# Patient Record
Sex: Female | Born: 1940 | Race: White | Hispanic: No | Smoking: Never smoker
Health system: Southern US, Community
[De-identification: ages and names within clinical notes are randomized; demographics above are authoritative.]

## PROBLEM LIST (undated history)

## (undated) DIAGNOSIS — M25569 Pain in unspecified knee: Secondary | ICD-10-CM

## (undated) DIAGNOSIS — Z923 Personal history of irradiation: Secondary | ICD-10-CM

## (undated) DIAGNOSIS — Z8719 Personal history of other diseases of the digestive system: Secondary | ICD-10-CM

## (undated) DIAGNOSIS — K219 Gastro-esophageal reflux disease without esophagitis: Secondary | ICD-10-CM

## (undated) DIAGNOSIS — I10 Essential (primary) hypertension: Secondary | ICD-10-CM

## (undated) DIAGNOSIS — N2889 Other specified disorders of kidney and ureter: Secondary | ICD-10-CM

## (undated) DIAGNOSIS — I671 Cerebral aneurysm, nonruptured: Secondary | ICD-10-CM

## (undated) DIAGNOSIS — I639 Cerebral infarction, unspecified: Secondary | ICD-10-CM

## (undated) DIAGNOSIS — R32 Unspecified urinary incontinence: Secondary | ICD-10-CM

## (undated) DIAGNOSIS — C801 Malignant (primary) neoplasm, unspecified: Secondary | ICD-10-CM

## (undated) DIAGNOSIS — I7789 Other specified disorders of arteries and arterioles: Secondary | ICD-10-CM

## (undated) DIAGNOSIS — R3915 Urgency of urination: Secondary | ICD-10-CM

## (undated) DIAGNOSIS — I499 Cardiac arrhythmia, unspecified: Secondary | ICD-10-CM

## (undated) HISTORY — PX: PILONIDAL CYST EXCISION: SHX744

## (undated) HISTORY — PX: LYSIS OF ADHESION: SHX5961

## (undated) HISTORY — PX: LAMINECTOMY: SHX219

## (undated) HISTORY — PX: LEFT ATRIAL APPENDAGE OCCLUSION: SHX173A

## (undated) HISTORY — PX: CHOLECYSTECTOMY: SHX55

## (undated) HISTORY — PX: BACK SURGERY: SHX140

## (undated) HISTORY — PX: ABDOMINAL HYSTERECTOMY: SHX81

## (undated) HISTORY — PX: BREAST LUMPECTOMY: SHX2

---

## 1992-08-08 DIAGNOSIS — C801 Malignant (primary) neoplasm, unspecified: Secondary | ICD-10-CM | POA: Insufficient documentation

## 1992-08-08 HISTORY — DX: Malignant (primary) neoplasm, unspecified: C80.1

## 1994-04-05 HISTORY — PX: BREAST EXCISIONAL BIOPSY: SUR124

## 1999-03-22 ENCOUNTER — Ambulatory Visit (HOSPITAL_COMMUNITY): Admission: RE | Admit: 1999-03-22 | Discharge: 1999-03-22 | Payer: Self-pay | Admitting: Gastroenterology

## 1999-10-19 ENCOUNTER — Encounter: Admission: RE | Admit: 1999-10-19 | Discharge: 1999-10-19 | Payer: Self-pay | Admitting: Internal Medicine

## 1999-10-19 ENCOUNTER — Encounter: Payer: Self-pay | Admitting: Internal Medicine

## 2001-01-02 ENCOUNTER — Other Ambulatory Visit: Admission: RE | Admit: 2001-01-02 | Discharge: 2001-01-02 | Payer: Self-pay | Admitting: Internal Medicine

## 2001-01-10 ENCOUNTER — Encounter: Admission: RE | Admit: 2001-01-10 | Discharge: 2001-01-10 | Payer: Self-pay | Admitting: Internal Medicine

## 2001-01-10 ENCOUNTER — Encounter: Payer: Self-pay | Admitting: Internal Medicine

## 2001-06-13 ENCOUNTER — Encounter: Payer: Self-pay | Admitting: Internal Medicine

## 2001-06-13 ENCOUNTER — Encounter: Admission: RE | Admit: 2001-06-13 | Discharge: 2001-06-13 | Payer: Self-pay | Admitting: Internal Medicine

## 2001-11-21 ENCOUNTER — Inpatient Hospital Stay (HOSPITAL_COMMUNITY): Admission: AD | Admit: 2001-11-21 | Discharge: 2001-11-22 | Payer: Self-pay | Admitting: *Deleted

## 2001-11-21 ENCOUNTER — Encounter: Payer: Self-pay | Admitting: *Deleted

## 2001-12-13 ENCOUNTER — Ambulatory Visit (HOSPITAL_COMMUNITY): Admission: RE | Admit: 2001-12-13 | Discharge: 2001-12-13 | Payer: Self-pay | Admitting: Gastroenterology

## 2001-12-19 ENCOUNTER — Encounter (INDEPENDENT_AMBULATORY_CARE_PROVIDER_SITE_OTHER): Payer: Self-pay | Admitting: Specialist

## 2001-12-19 ENCOUNTER — Ambulatory Visit (HOSPITAL_COMMUNITY): Admission: RE | Admit: 2001-12-19 | Discharge: 2001-12-19 | Payer: Self-pay | Admitting: Orthopedic Surgery

## 2002-03-07 ENCOUNTER — Encounter: Admission: RE | Admit: 2002-03-07 | Discharge: 2002-03-07 | Payer: Self-pay | Admitting: Urology

## 2002-03-07 ENCOUNTER — Encounter: Payer: Self-pay | Admitting: Urology

## 2002-03-07 IMAGING — CT CT ABDOMEN W/O CM
1 series · 15 of 32 positions shown, 19 images · IV contrast (agent unspecified)
Comparison: none

FINDINGS
CLINICAL DATA: LEFT ABDOMINAL PAIN WITH GROSS HEMATURIA.
TECHNIQUE
THE STUDY WAS DONE WITH KIDNEY STONE PROTOCOL CONSISTING OF MULTIDETECTOR HELICAL IMAGING THROUGH
THE URINARY TRACT WITHOUT ORAL OR IV CONTRAST.
TODAY'S EXAM IS COMPARED TO A PRIOR STUDY FROM 6/98, ALTHOUGH THAT STUDY WAS DONE WITH CONTRAST
ORAL AND IV.
CT ABDOMEN W/O CONTRAST
STATUS POST CHOLECYSTECTOMY WITH SURGICAL CLIPS IN THE GALLBLADDER FOSSA.  NO DEFINITE RENAL
CALCULI IN THE COLLECTING SYSTEMS.  THERE MAY BE ONE FAINTLY CALCIFIED STONE IN THE LEFT MEDIAL
KIDNEY BUT THIS ACTUALLY LOOKS MORE LIKE A CALCIFICATION IN THE PARENCHYMA WITHIN THE COLLECTING
SYSTEM.  LOOKING BACK AT THE PRIOR STUDY FROM [I8], I BELIEVE IT WAS PRESENT PREVIOUSLY.  NO
HYDRONEPHROSIS OR HYDROURETER.  THERE IS CALCIFICATION IN THE AORTA WITHOUT ANEURYSM.  NO ASCITES,
FOCAL MASSES, OR FLUID COLLECTIONS.  GIVEN THE LIMITATIONS OF SCANNING WITHOUT ORAL OR IV CONTRAST.
IMPRESSION
1.  INTERIM CHOLECYSTECTOMY.
2.  NO EVIDENCE FOR DEFINITE URINARY TRACT CALCULI OR OBSTRUCTION.
3.  THERE IS A SMALL CALCIFICATION IN THE LEFT MEDIAL KIDNEY, PROBABLY IN THE PARENCHYMA RATHER
THAN IN THE COLLECTING SYSTEM.  I BELIEVE THIS WAS PRESENT PREVIOUSLY.
CT PELVIS W/O CONTRAST
I CANNOT SEE ANY DEFINITE URETERAL OBSTRUCTION OR DILATATION.  THERE ARE SOME CALCIFICATIONS IN THE
ILIAC VESSELS THAT SIMULATE STONES.  LOOKING BACK AT THE OLD SCAN THESE CALCIFICATIONS WERE PRESENT
PREVIOUSLY.  I DO NOT SEE ANYTHING THAT IS DEFINITIVELY A URETERAL CALCULUS.  NO BLADDER CALCULI.
NO MASSES OR FLUID COLLECTIONS.
1.  NO DEFINITE URINARY TRACT CALCULI OR OBSTRUCTION.
2.  THERE ARE SOME SMALL CALCIFICATIONS IN THE PELVIS ON BOTH SIDES, THAT I BELIEVE ARE ILIAC
ARTERY BRANCH CALCIFICATIONS, RATHER THAN URETERAL CALCULI.

[Series 2: renal stone · axial · 0.70mm/px · z∈[-370,-85]mm · 15 of 65 slices shown, 19 images]
[im 5/65  soft-tissue]
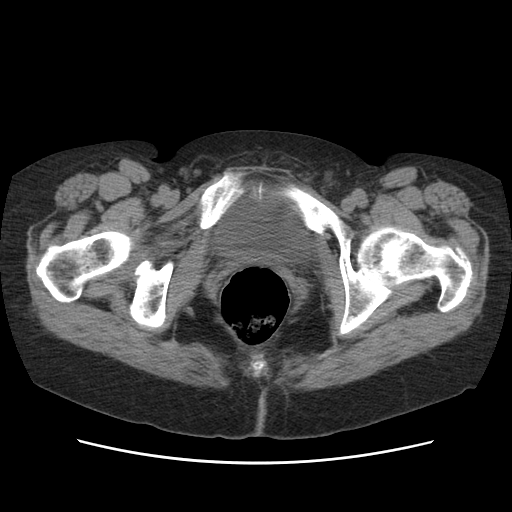
[im 5/65  bone]
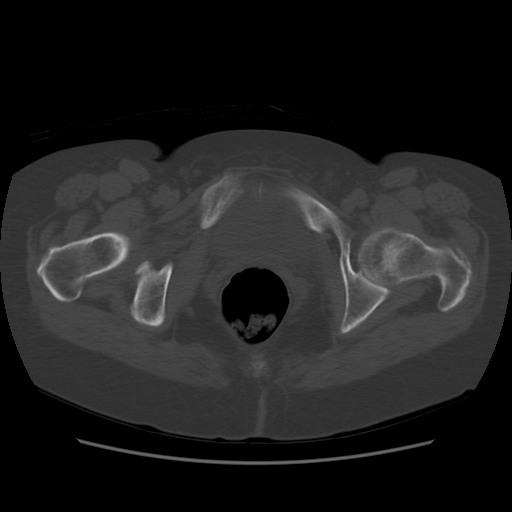
[im 9/65  soft-tissue]
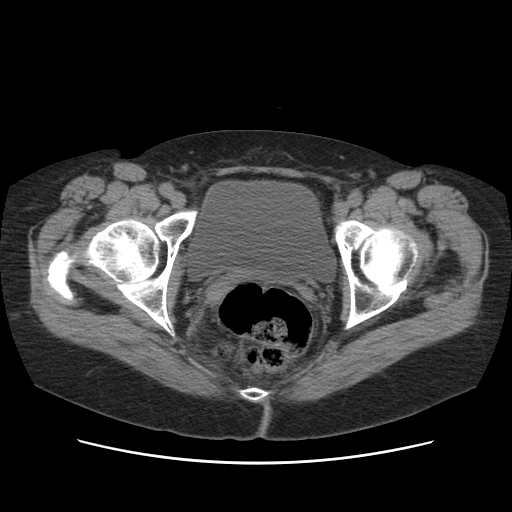
[im 13/65  soft-tissue]
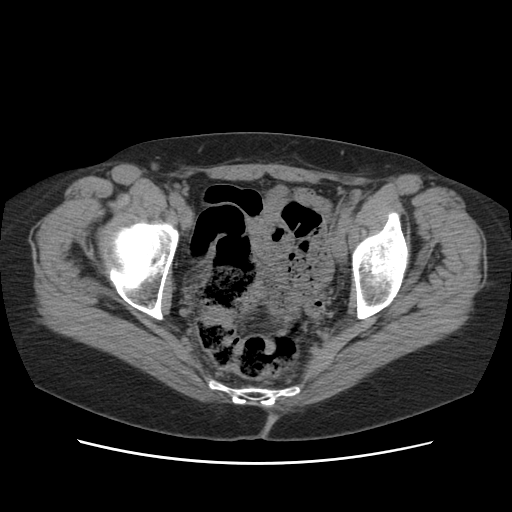
[im 19/65  soft-tissue]
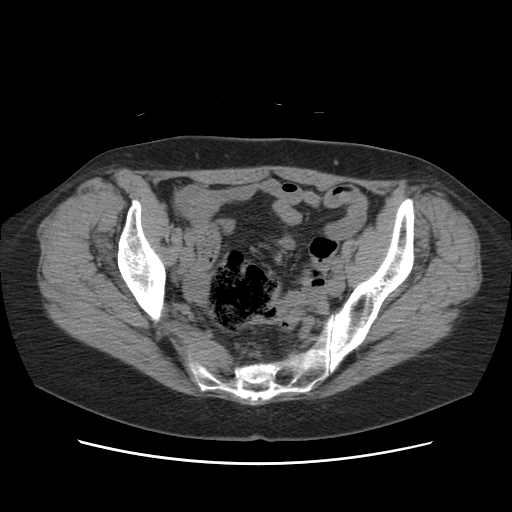
[im 23/65  soft-tissue]
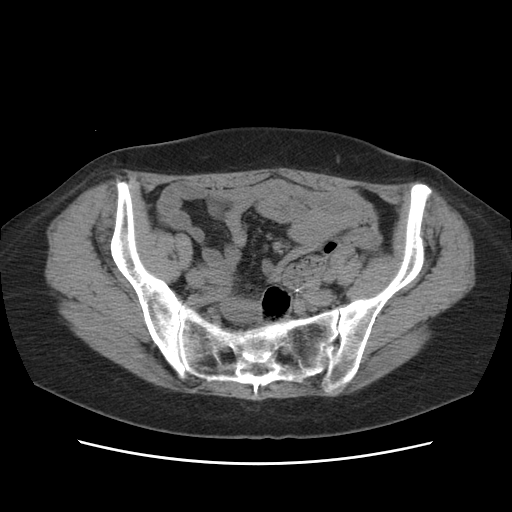
[im 27/65  soft-tissue]
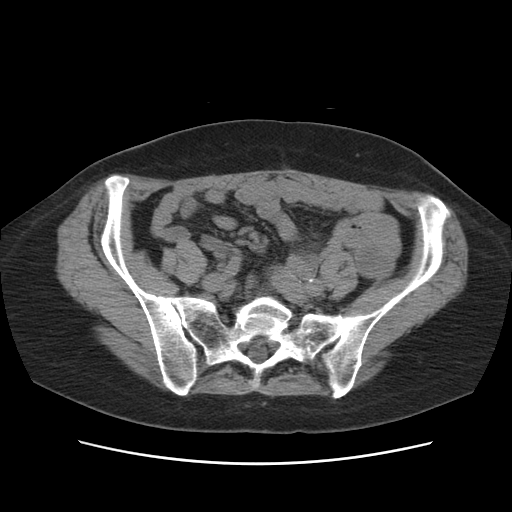
[im 34/65  soft-tissue]
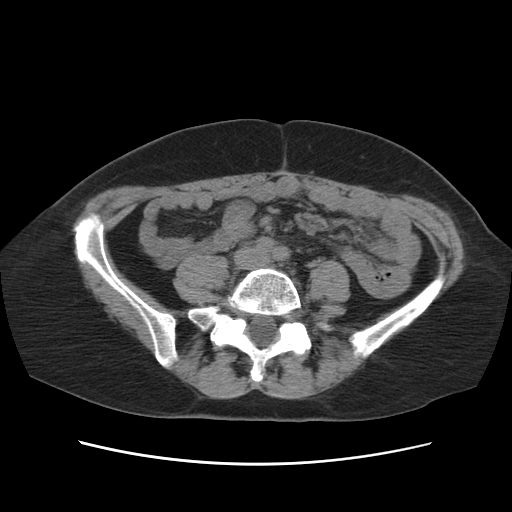
[im 38/65  soft-tissue]
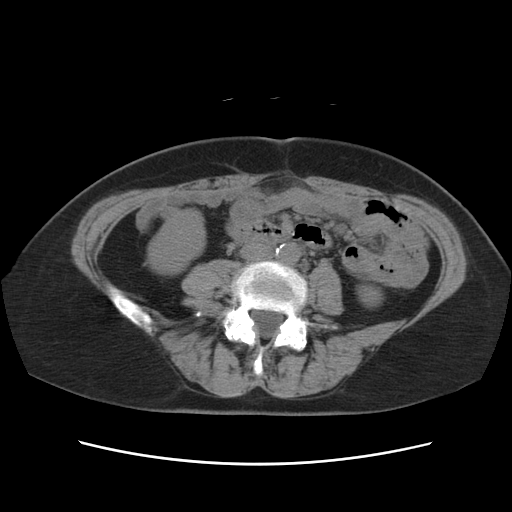
[im 42/65  soft-tissue]
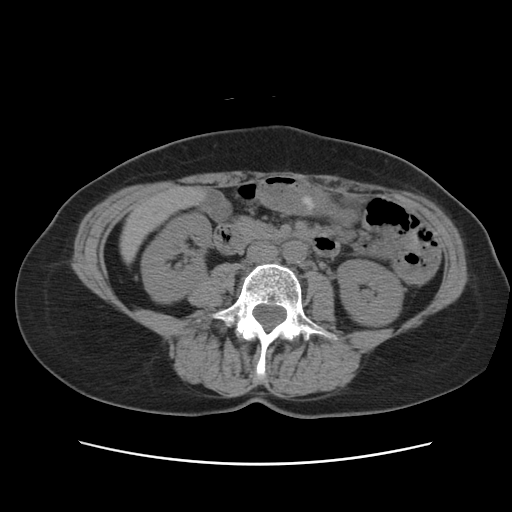
[im 42/65  bone]
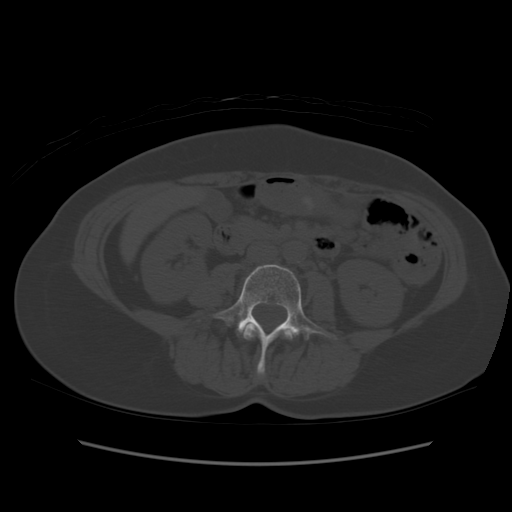
[im 46/65  soft-tissue]
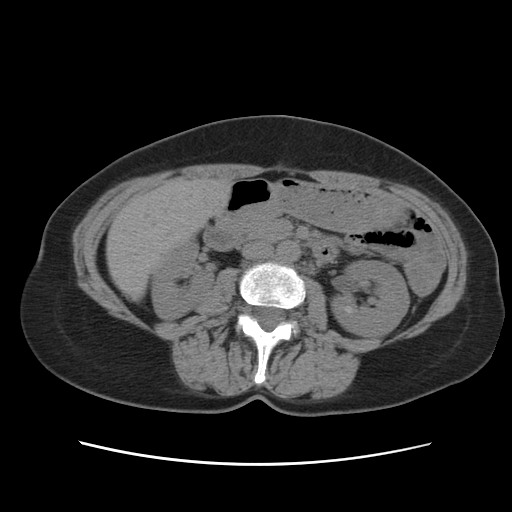
[im 52/65  soft-tissue]
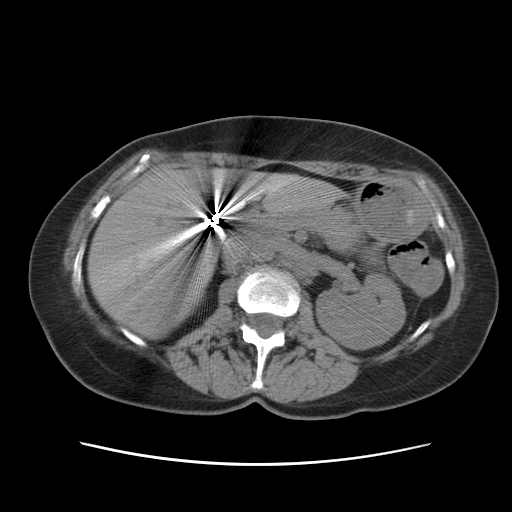
[im 56/65  soft-tissue]
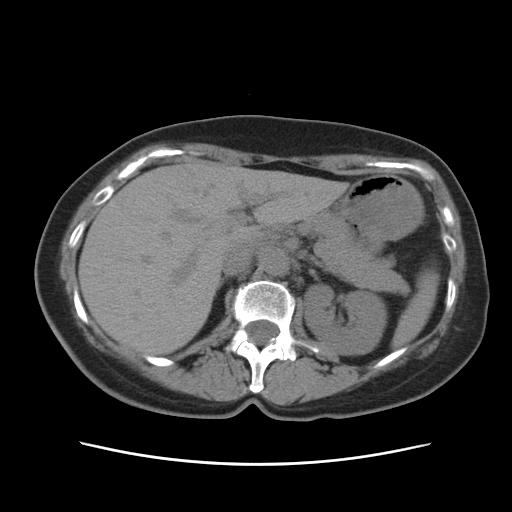
[im 56/65  lung]
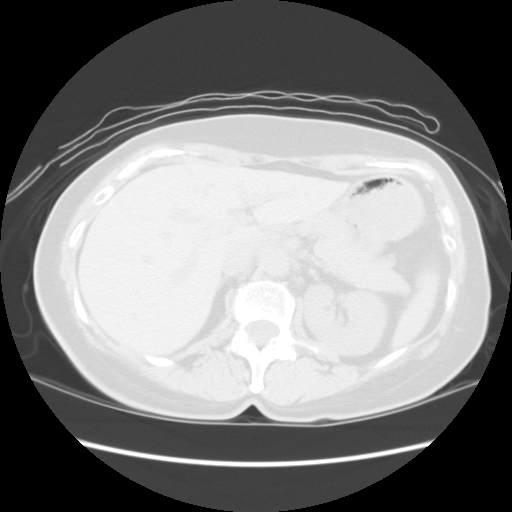
[im 58/65  lung]
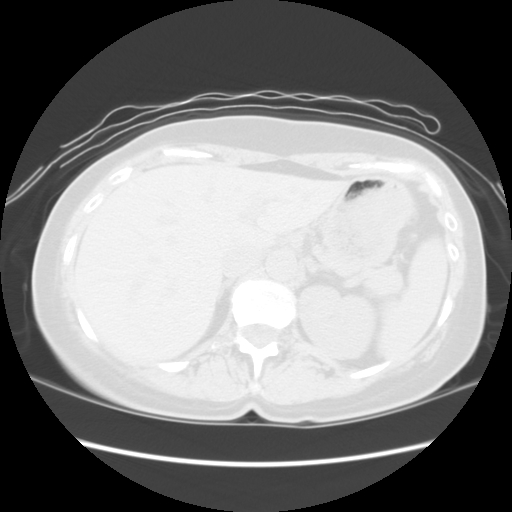
[im 60/65  soft-tissue]
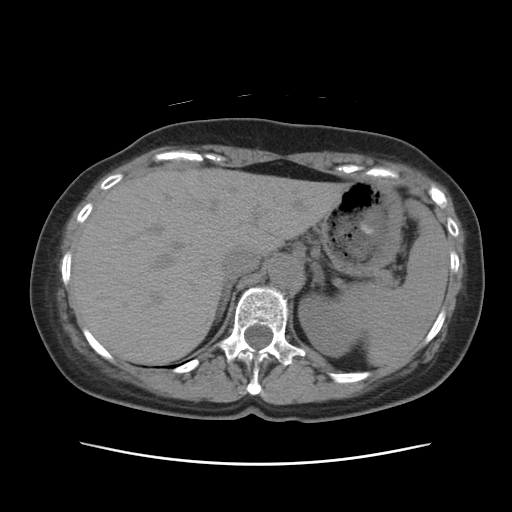
[im 60/65  lung]
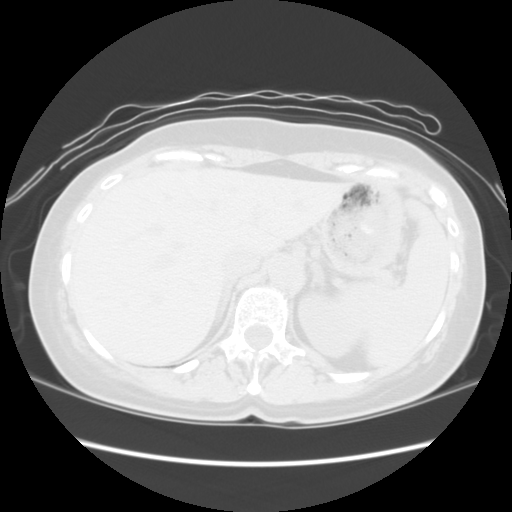
[im 62/65  lung]
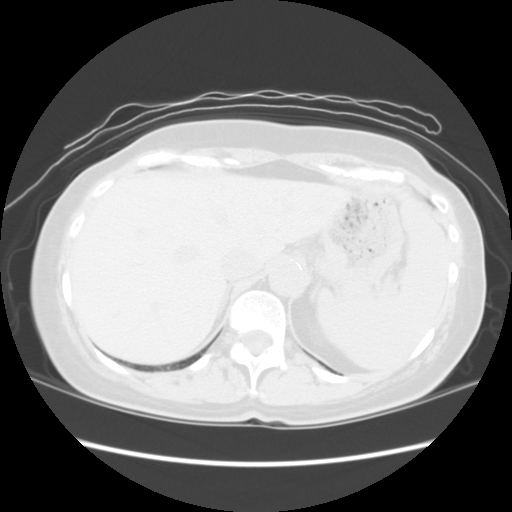

[15 of 32 positions shown; findings below may reference images not displayed]

## 2002-06-12 ENCOUNTER — Encounter (INDEPENDENT_AMBULATORY_CARE_PROVIDER_SITE_OTHER): Payer: Self-pay | Admitting: Specialist

## 2002-06-12 ENCOUNTER — Ambulatory Visit (HOSPITAL_COMMUNITY): Admission: RE | Admit: 2002-06-12 | Discharge: 2002-06-12 | Payer: Self-pay | Admitting: Gastroenterology

## 2002-12-04 ENCOUNTER — Encounter: Admission: RE | Admit: 2002-12-04 | Discharge: 2002-12-04 | Payer: Self-pay | Admitting: Internal Medicine

## 2002-12-04 ENCOUNTER — Encounter: Payer: Self-pay | Admitting: Internal Medicine

## 2003-08-21 ENCOUNTER — Encounter: Admission: RE | Admit: 2003-08-21 | Discharge: 2003-08-21 | Payer: Self-pay | Admitting: Internal Medicine

## 2003-09-04 ENCOUNTER — Encounter: Admission: RE | Admit: 2003-09-04 | Discharge: 2003-09-04 | Payer: Self-pay | Admitting: Family Medicine

## 2003-09-04 IMAGING — CT CT ABDOMEN WO/W CM
1 of 2 series · 14 of 32 positions shown, 18 images · IV contrast (GASTRO)
Comparison: none

CLINICAL DATA: Hematuria, right lower quadrant pain, hypertension.  Post hysterectomy.  Colon cancer removed.  Post cholecystectomy.  (Contrast code: None).
ABDOMINAL CT ? PRE- AND POST- CONTRAST ? [DATE]

[Series 3: — · axial · 0.70mm/px · z∈[-337,+18]mm · 14 of 107 slices shown, 18 images]
[im 5/107  soft-tissue]
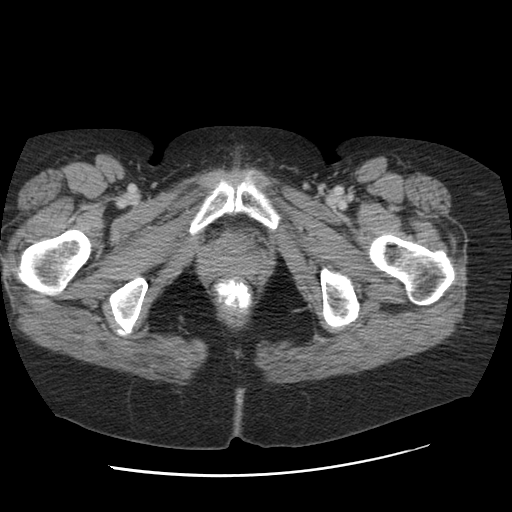
[im 5/107  bone]
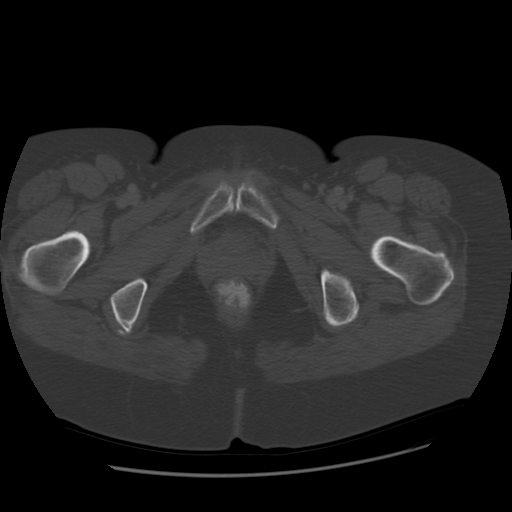
[im 15/107  soft-tissue]
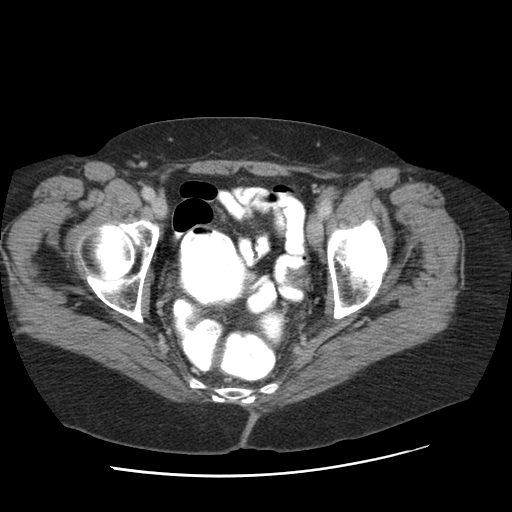
[im 25/107  soft-tissue]
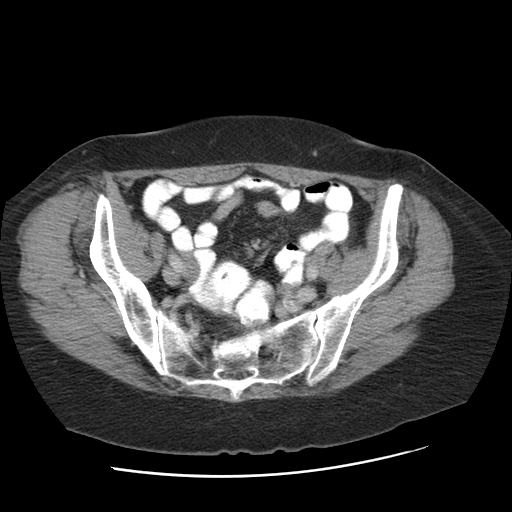
[im 34/107  soft-tissue]
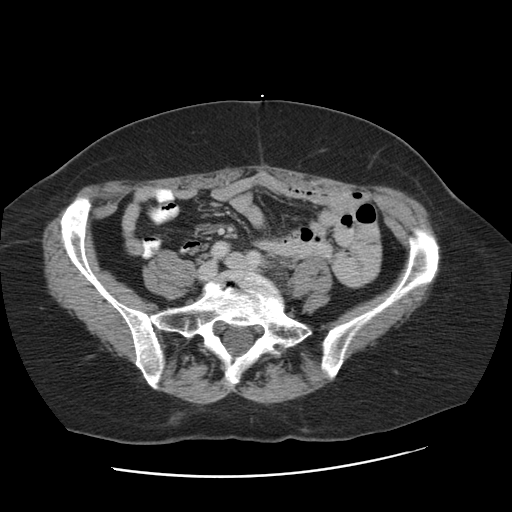
[im 39/107  soft-tissue]
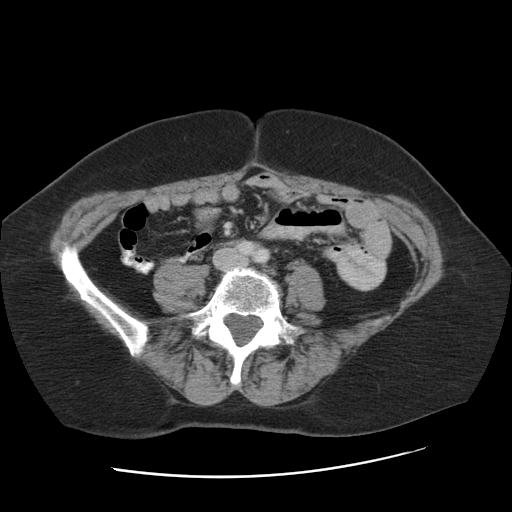
[im 49/107  soft-tissue]
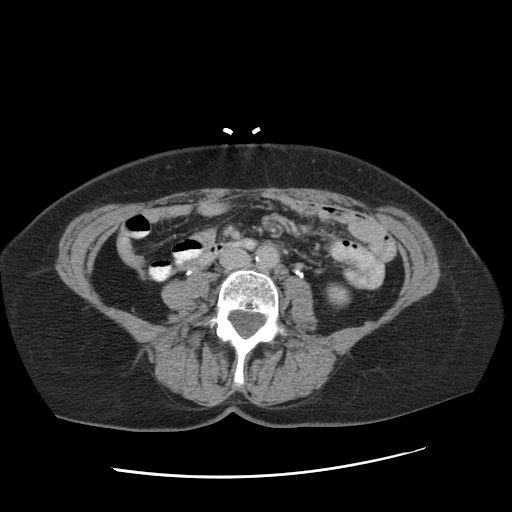
[im 58/107  soft-tissue]
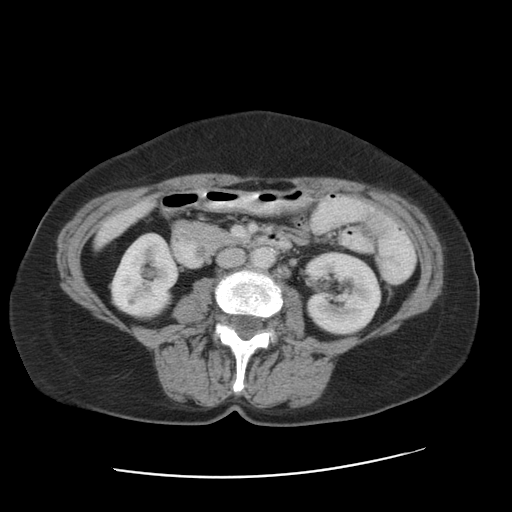
[im 68/107  soft-tissue]
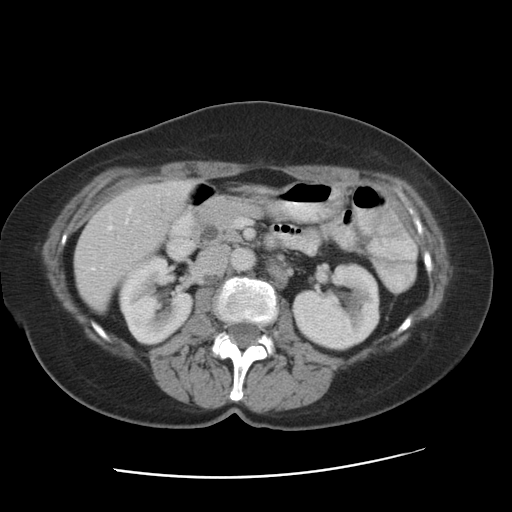
[im 73/107  soft-tissue]
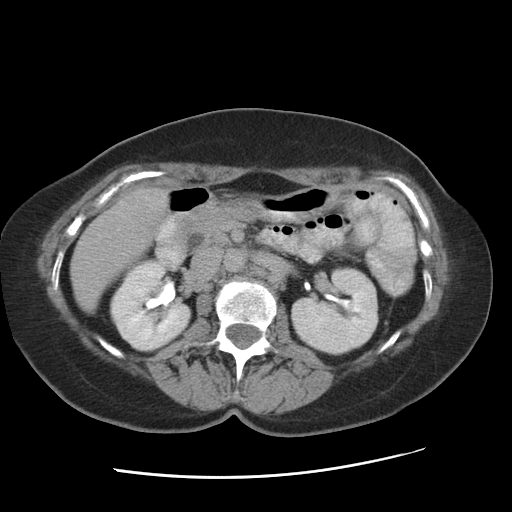
[im 73/107  bone]
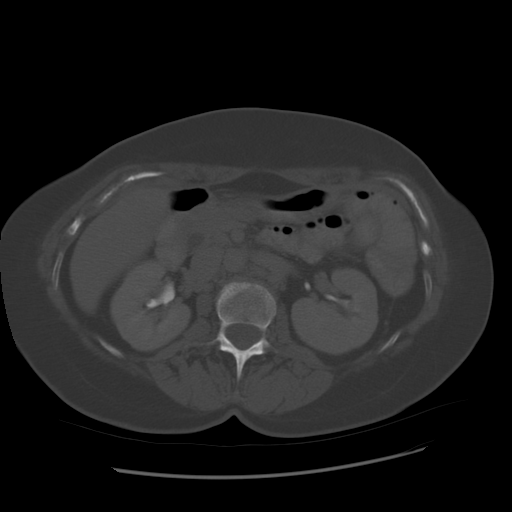
[im 82/107  soft-tissue]
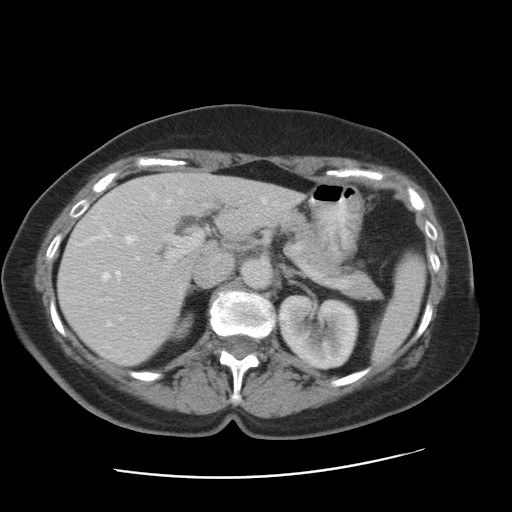
[im 87/107  lung]
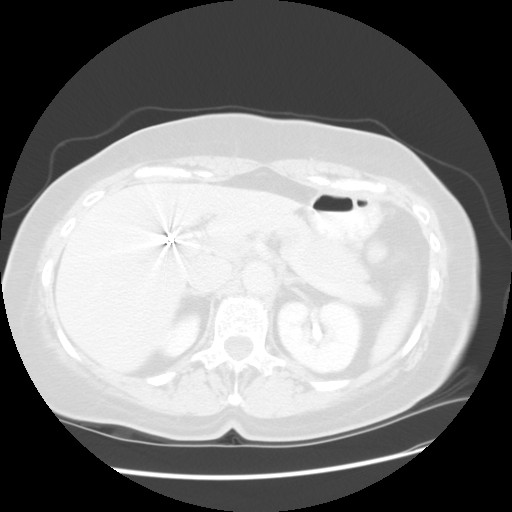
[im 92/107  soft-tissue]
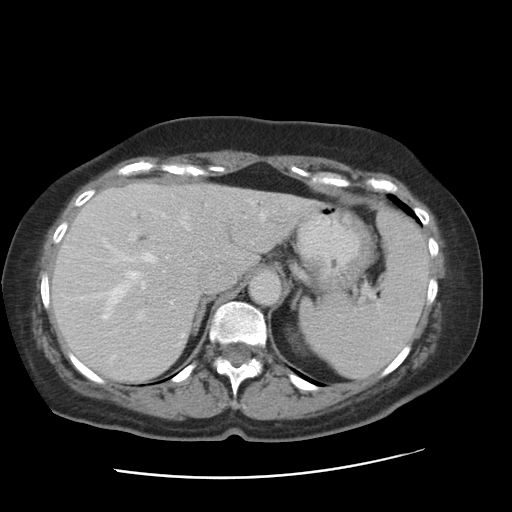
[im 92/107  lung]
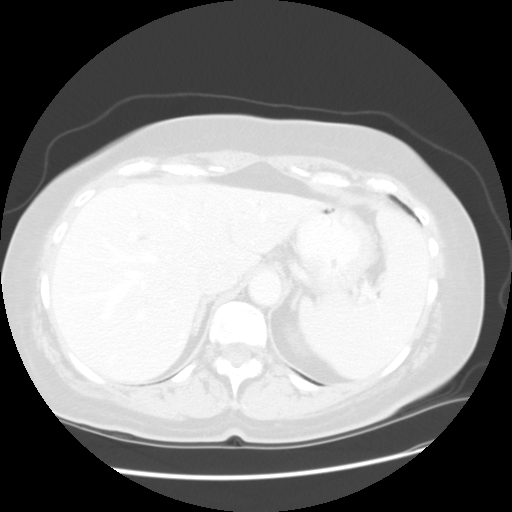
[im 97/107  lung]
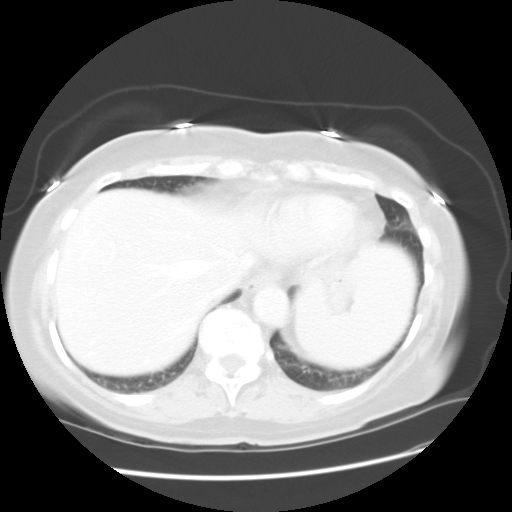
[im 102/107  soft-tissue]
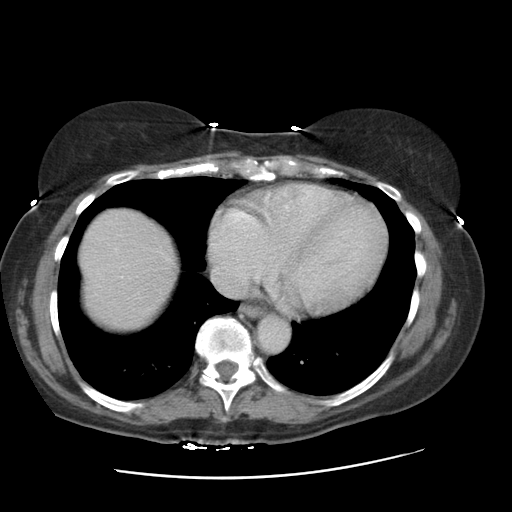
[im 102/107  lung]
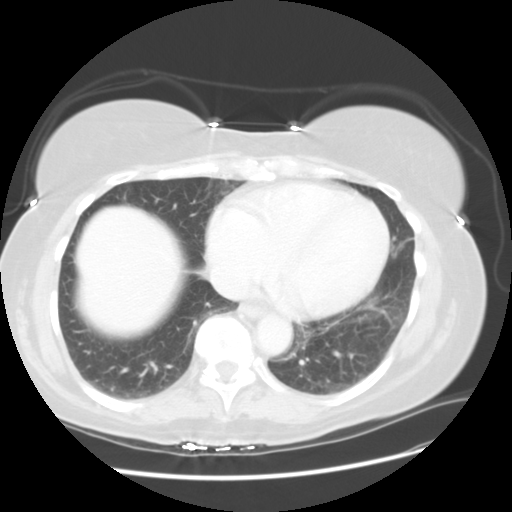

[14 of 32 positions shown; findings below may reference images not displayed]

FINDINGS: Following oral contrast pre- and post- IV administration of 100 cc of Omnipaque 300, multidetector spiral axial images were obtained through the abdomen, and comparison made with prior abdominal and pelvic CT urogram of [DATE] and abdominal ultrasound of [DATE].  
Currently a 6 mm curvilinear density is seen in the region of a small 5 mm round density at the posterior mid left kidney (current image #24 ? previous image #20).  This probably represents stable cortical  calcification.  No other enhancing nor cystic focus is seen on immediate and delayed IV contrast enhanced images at this level.  A duplicated left renal collecting system and ureter probably accounts for larger left renal size as noted on previous abdominal ultrasound of [DATE].  No hydronephrosis nor renal calculi are seen.  The patient remains post cholecystectomy since previous abdominal ultrasound and stable since previous abdominal CT with probable postoperative focal dilatation of the portal hepatis segment of the common bile duct which measures up to 1.5 cm (image #26).  No other proximal nor distal biliary nor pancreatic ductal distention is appreciated.  The patient is near total colectomy with residual rectosigmoid colon.  No evidence for metastatic liver disease nor adenopathy is seen.  Base of the lungs are clear.  On delayed images, probable tiny cortical renal cysts are seen at the anterior mid left (image #94) and 3 mm at the inferior right (image #97).  Remaining abdominal organs appear normal with no new inflammatory change.
IMPRESSION
Since abdominal and pelvic CT urogram [DATE]:
1.  Stable 6 mm faint cortical calcification at the posterior mid left kidney with tiny bilateral cortical renal cystic foci.  Anatomic variant duplication of the left renal collecting system and ureter is noted.
2.  Post cholecystectomy and near total colectomy (see findings on focal dilatation porta hepatis segment CBD).
3.  No evidence for metastatic disease.
PELVIC CT WITH CONTRAST ? [DATE]
FINDINGS: Following oral and intravenous contrast, multidetector spiral axial images were obtained through the pelvis and demonstrate near total colectomy with surgical clips and residual rectosigmoid colon which appear normal.  The anastomosis is unremarkable by CT assessment.  No new adenopathy nor inflammation is seen. The patient is post appendectomy and hysterectomy by history.  Degenerative disk vacuum is seen at L5-S1.  Comparison is made with previous pelvic CT urogram of [DATE].
IMPRESSION
1.  Normal postoperative study.
2.  Degenerative disk disease at L5-S1.

## 2004-04-01 ENCOUNTER — Encounter: Admission: RE | Admit: 2004-04-01 | Discharge: 2004-04-01 | Payer: Self-pay | Admitting: Gastroenterology

## 2004-04-01 IMAGING — RF DG UGI W/ SMALL BOWEL HIGH DENSITY
13 of 19 series · 13 of 19 positions shown · non-contrast
Comparison: none

CLINICAL DATA: Abdominal pain.
UPPER GI/SMALL BOWEL FOLLOW THROUGH 
Scout film unremarkable except for cholecystectomy clips, surgical clips over the left sacrum, and degenerative changes of the spine.
Swallowing mechanism normal.  No lesions of the esophagus.  There is a small sliding hiatus hernia noted with the Valsalva maneuver, but no reflux identified.  No esophagitis or stricture.
Normal mucosal pattern and contour.  Normal peristalsis and gastric emptying.  Duodenal bulb and C-loop normal.  Ligament of Treitz anatomically positioned.
The patient was given additional barium and sequential images were obtained as the contrast traversed the small bowel.  Transit time through the colon is somewhat prolonged.  At one hour and 50 minutes, contrast is in the distal small bowel but not in the colon.  The patient asked if she and her husband could leave for a short period of time to get something to eat.  She was re-x-rayed immediately upon her return, but by then, it was three hours and 15 minutes post injestion.   The plain film showed that almost all of the contrast has traversed the small bowel.  There is only a small amount of contrast in the colon.  The patient states she had a bowel movement while out at lunch.  Therefore, I cannot visualize the terminal ileum sufficiently to rule out pathology.
IMPRESSION
1.  Small sliding hiatal hernia without reflux, esophagitis, or stricture.
2.  Stomach and duodenum normal.
3.  No pathological abnormality identified in the small bowel.  However, the terminal ileum is not visualized.  See above discussion.

[Series 1: run · 1 of 1 slices shown (1 of 8)]
[im 1/1]
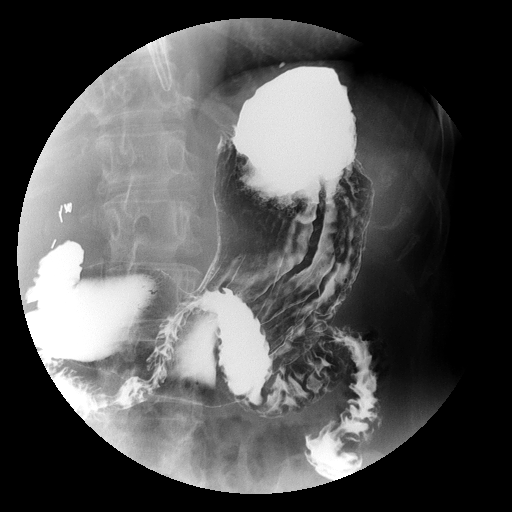

[Series 3: run · 1 of 1 slices shown (2 of 8)]
[im 1/1]
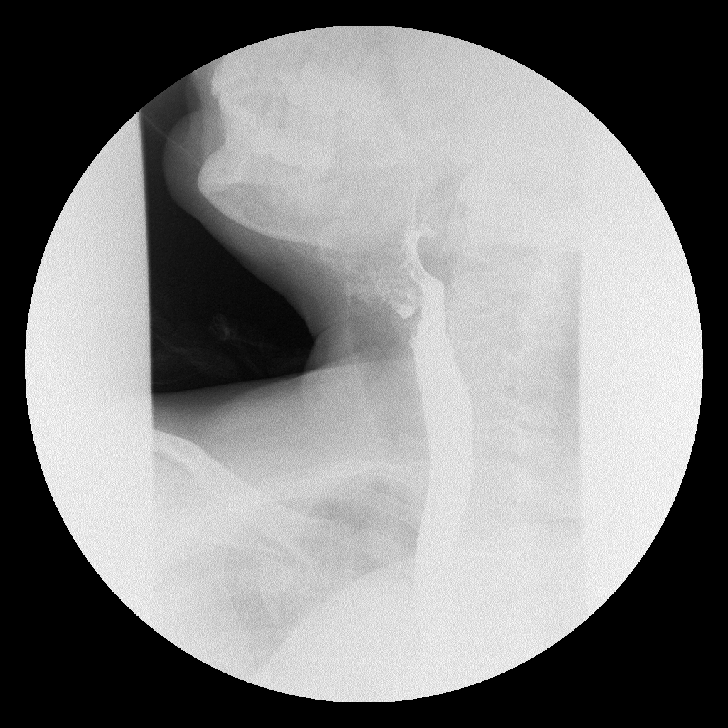

[Series 4: run · 1 of 1 slices shown (3 of 8)]
[im 1/1]
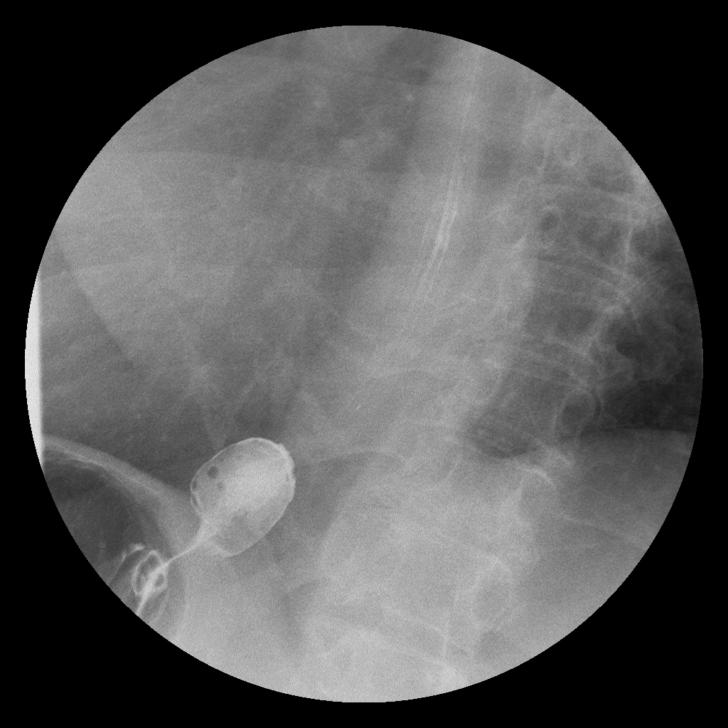

[Series 6: run · 1 of 1 slices shown (4 of 8)]
[im 1/1]
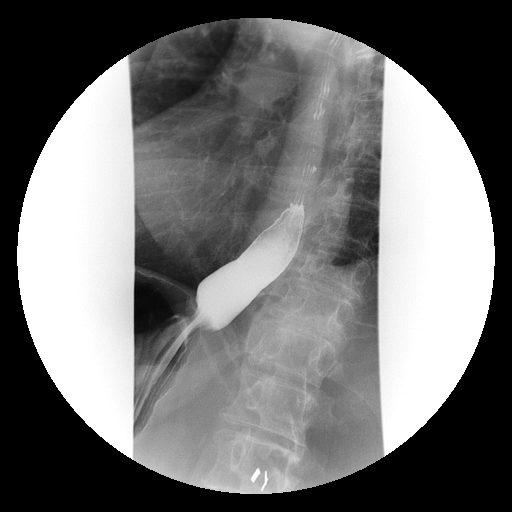

[Series 7: run · 1 of 1 slices shown (5 of 8)]
[im 1/1]
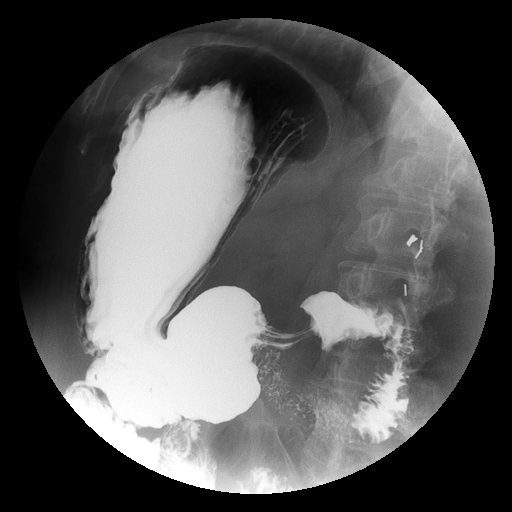

[Series 9: run · 1 of 1 slices shown (6 of 8)]
[im 1/1]
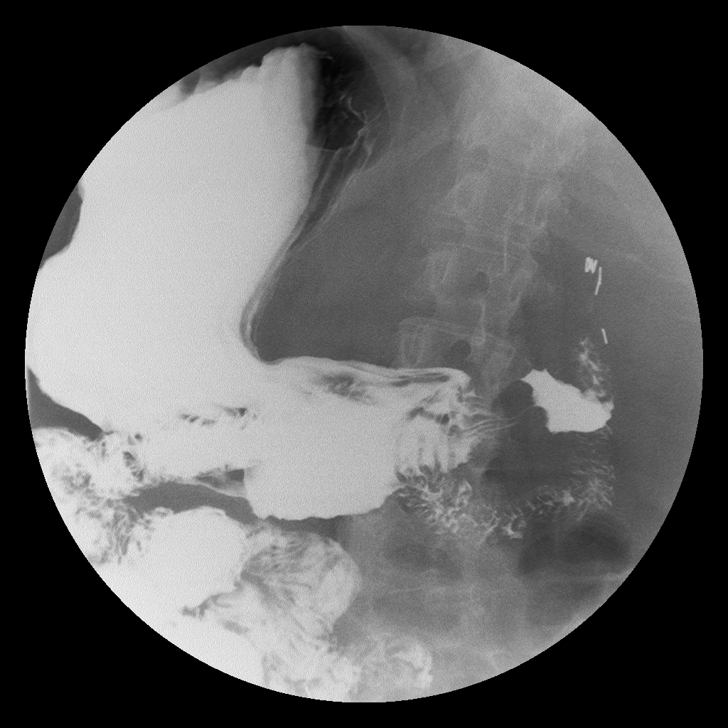

[Series 10: run · 1 of 1 slices shown (7 of 8)]
[im 1/1]
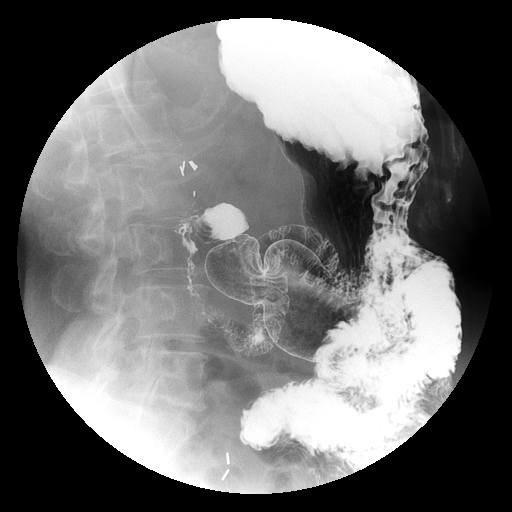

[Series 11: run · 1 of 1 slices shown (8 of 8)]
[im 1/1]
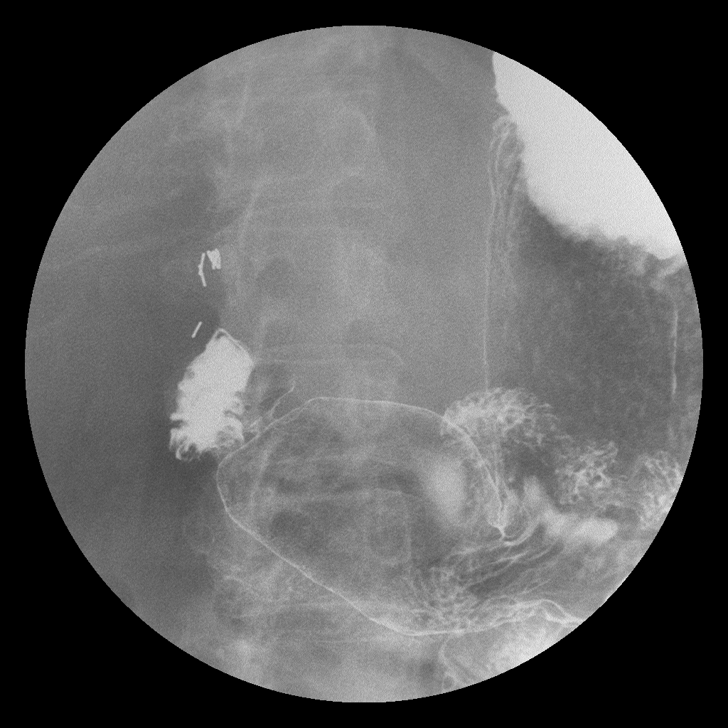

[Series 1001: view not recorded · 0.20mm/px · 1 of 1 slices shown (1 of 5)]
[im 1/1]
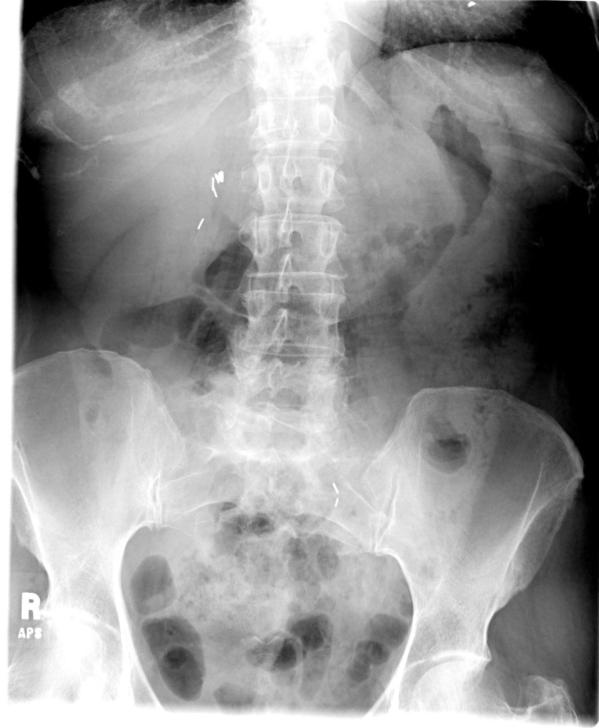

[Series 1002: view not recorded · 0.20mm/px · 1 of 1 slices shown (2 of 5)]
[im 1/1]
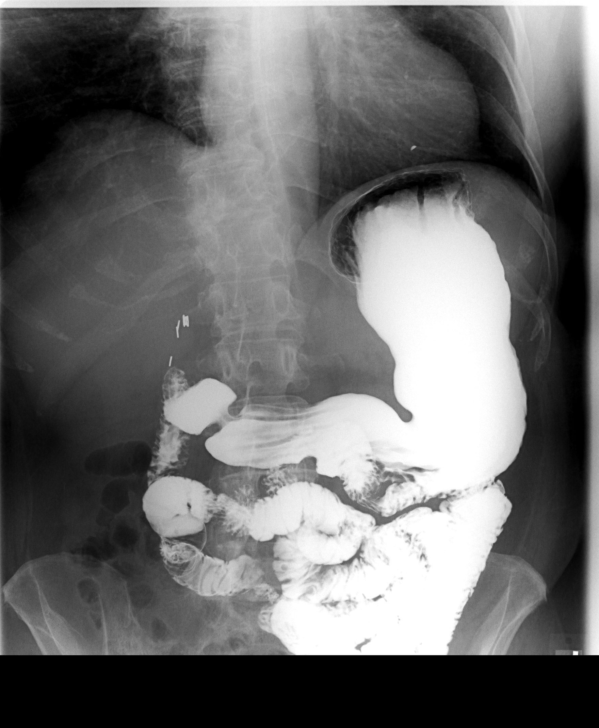

[Series 1004: view not recorded · 0.20mm/px · 1 of 1 slices shown (3 of 5)]
[im 1/1]
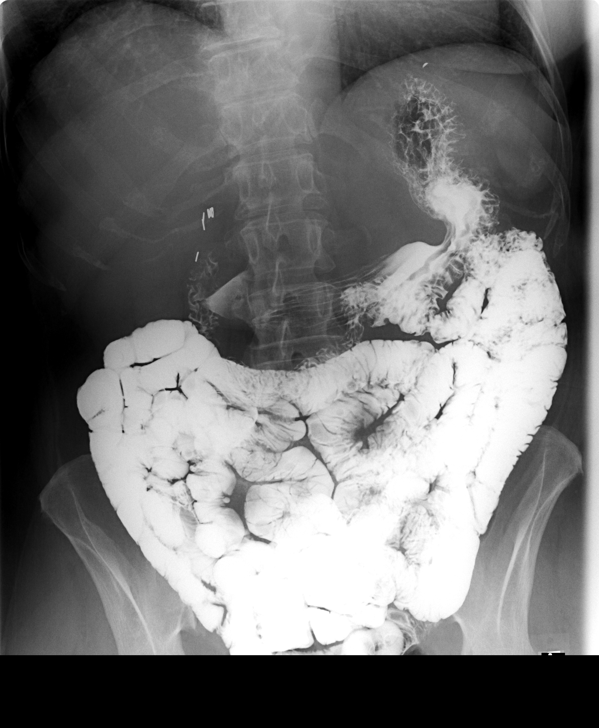

[Series 1005: view not recorded · 0.20mm/px · 1 of 1 slices shown (4 of 5)]
[im 1/1]
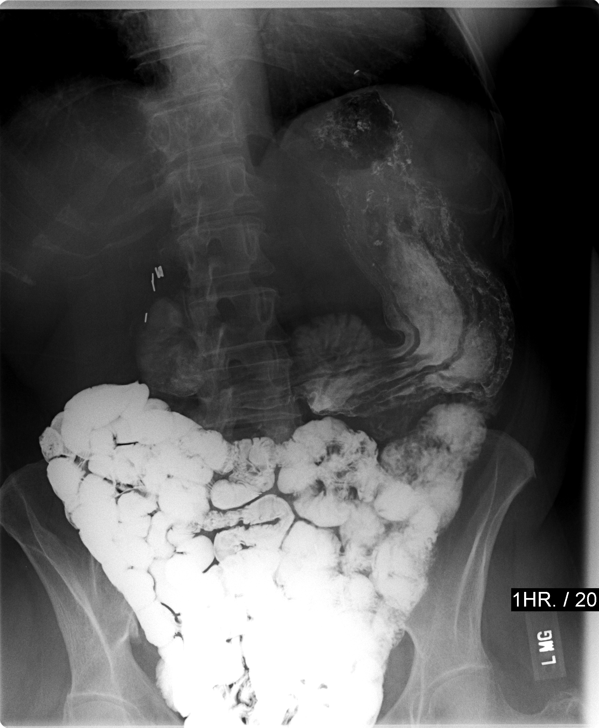

[Series 1007: view not recorded · 0.20mm/px · 1 of 1 slices shown (5 of 5)]
[im 1/1]
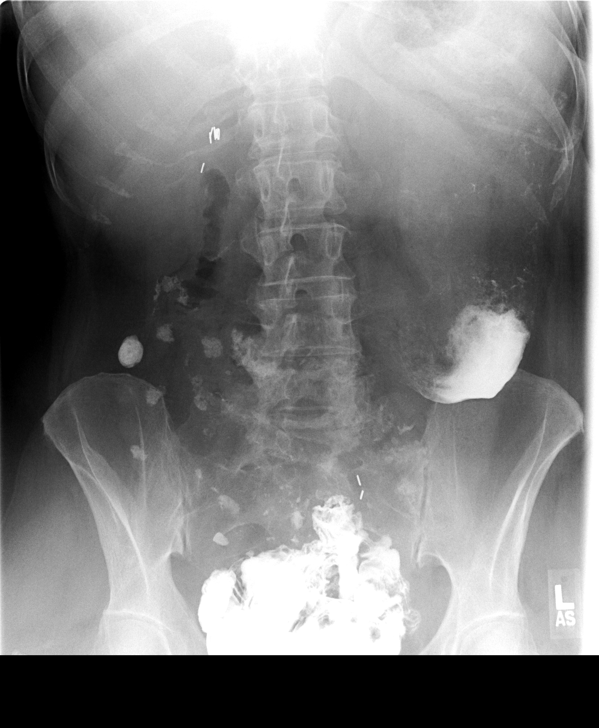

[13 of 19 positions shown; findings below may reference images not displayed]

## 2005-02-11 ENCOUNTER — Encounter: Admission: RE | Admit: 2005-02-11 | Discharge: 2005-02-11 | Payer: Self-pay | Admitting: Internal Medicine

## 2006-02-21 ENCOUNTER — Encounter: Admission: RE | Admit: 2006-02-21 | Discharge: 2006-02-21 | Payer: Self-pay | Admitting: Internal Medicine

## 2006-02-21 IMAGING — MG MM SCREEN MAMMOGRAM BILATERAL
4 series · 4 of 4 positions shown · non-contrast
Comparison: none

DG SCREEN MAMMOGRAM BILATERAL
Bilateral CC and MLO view(s) were taken.
Prior study comparison: [DATE], bilateral screening mammogram.

SCREENING MAMMOGRAM:
The breast tissue is heterogeneously dense.  There is no dominant mass, architectural distortion or
calcification to suggest malignancy.

[R CC]
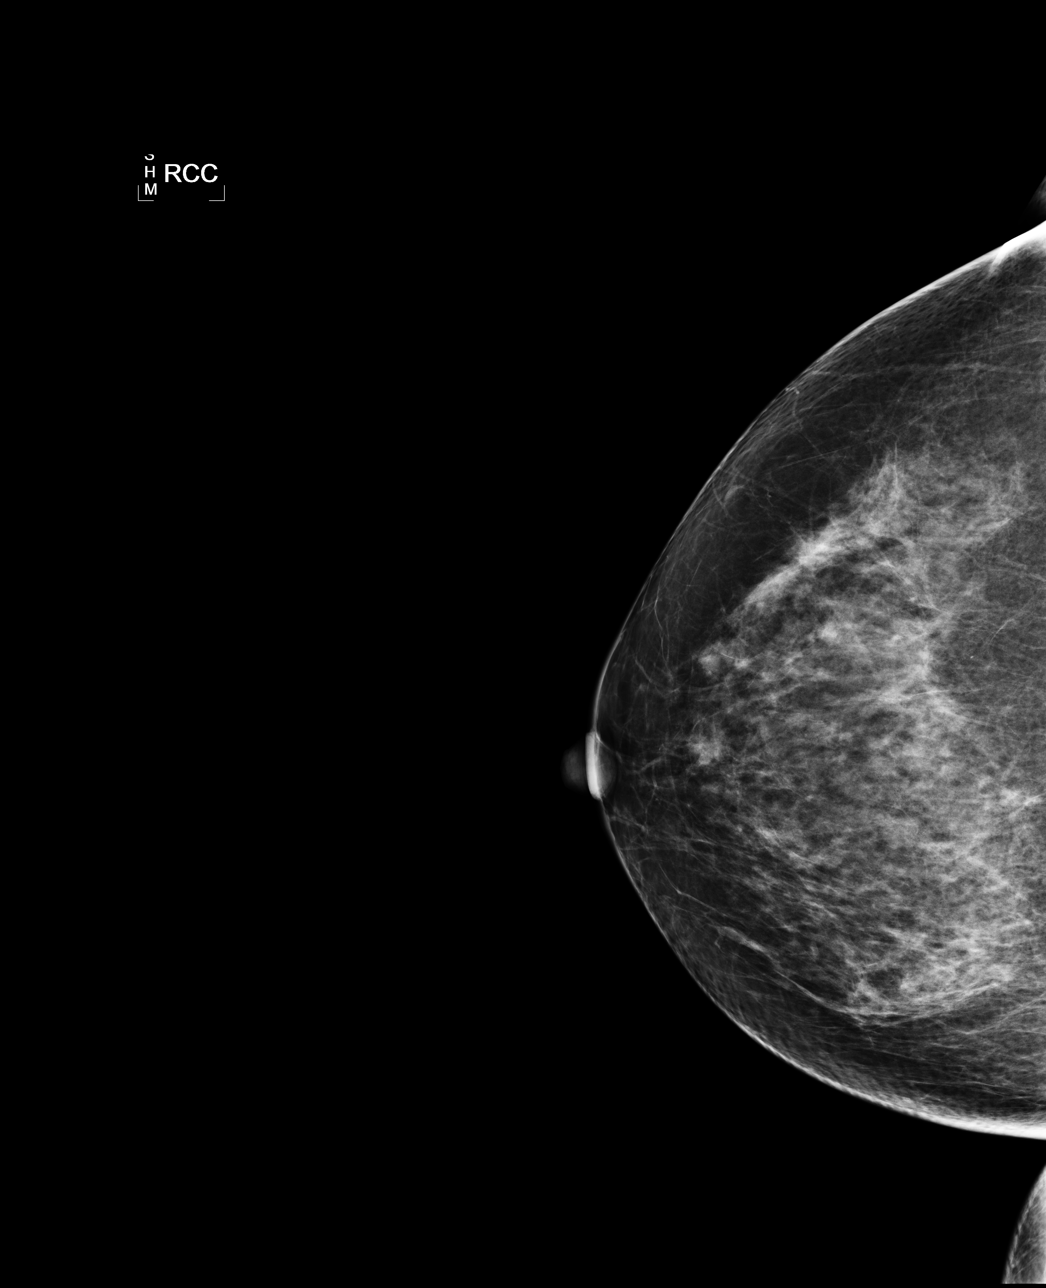

[L CC]
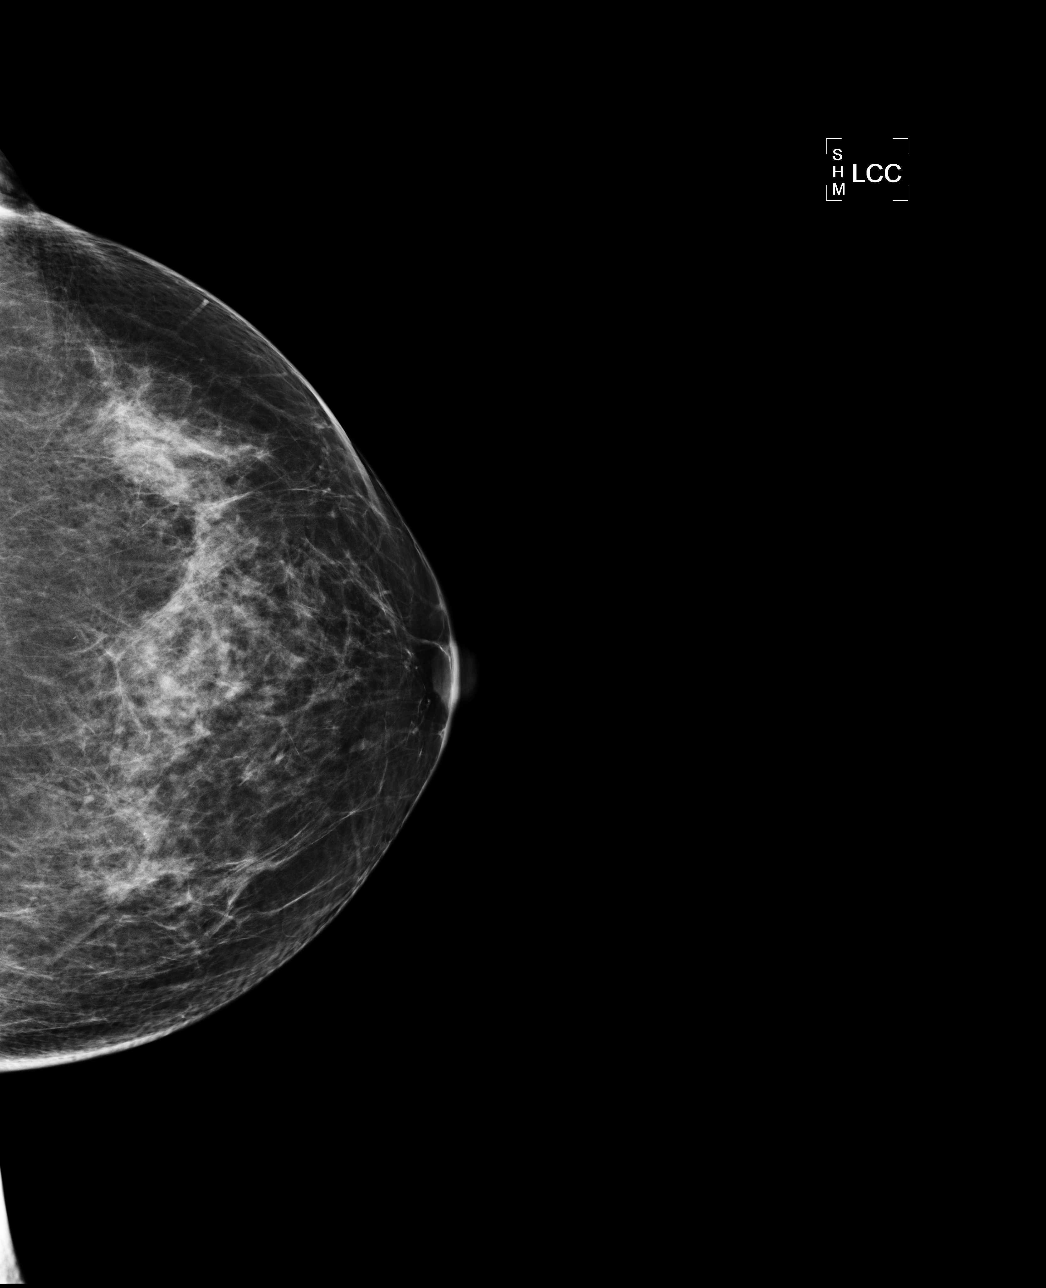

[L MLO]
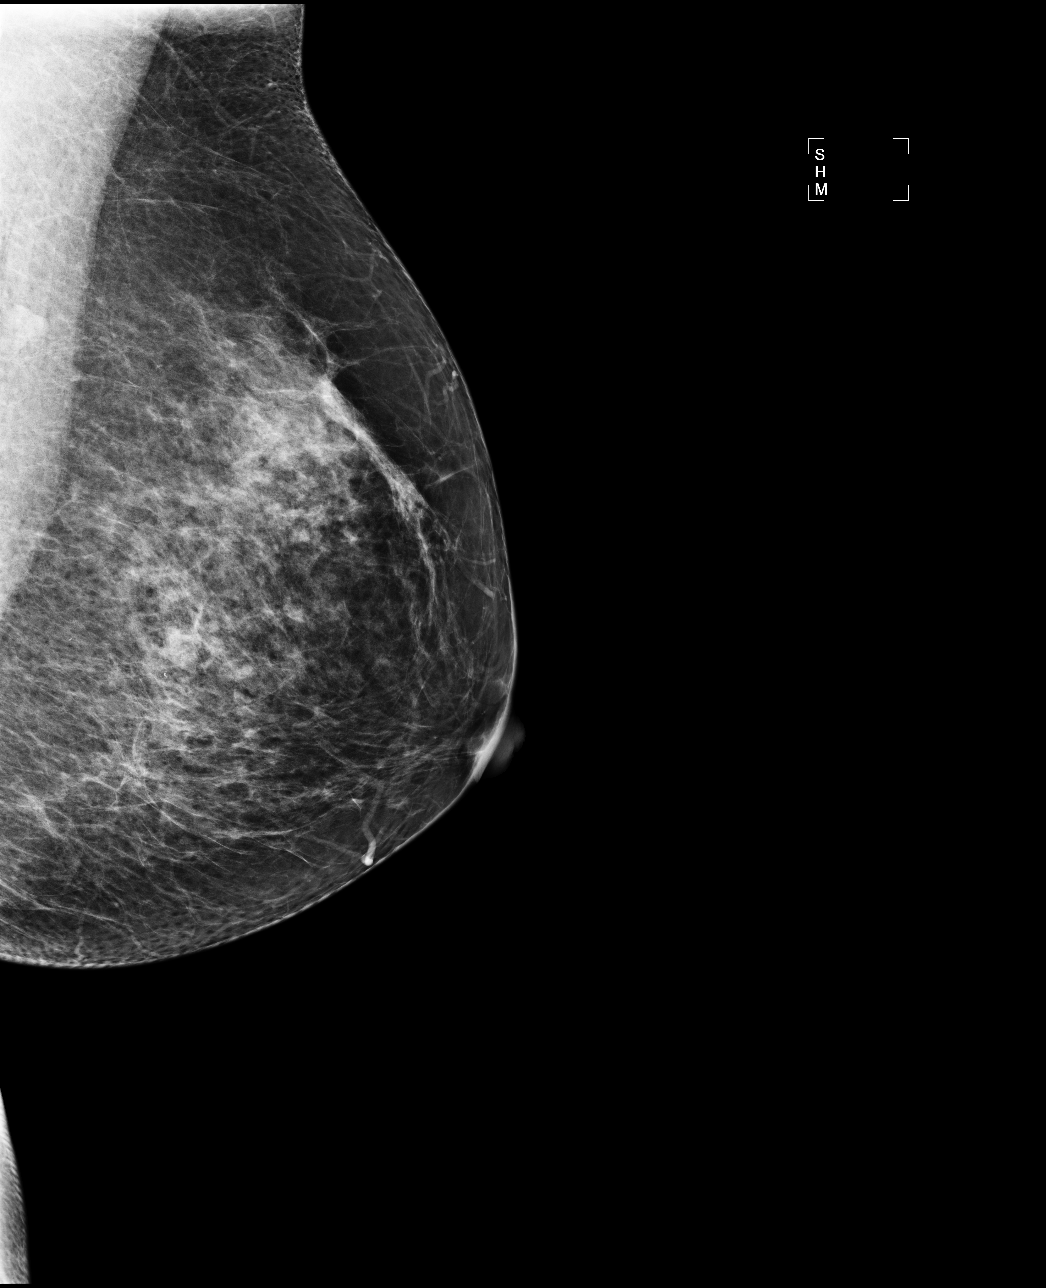

[R MLO]
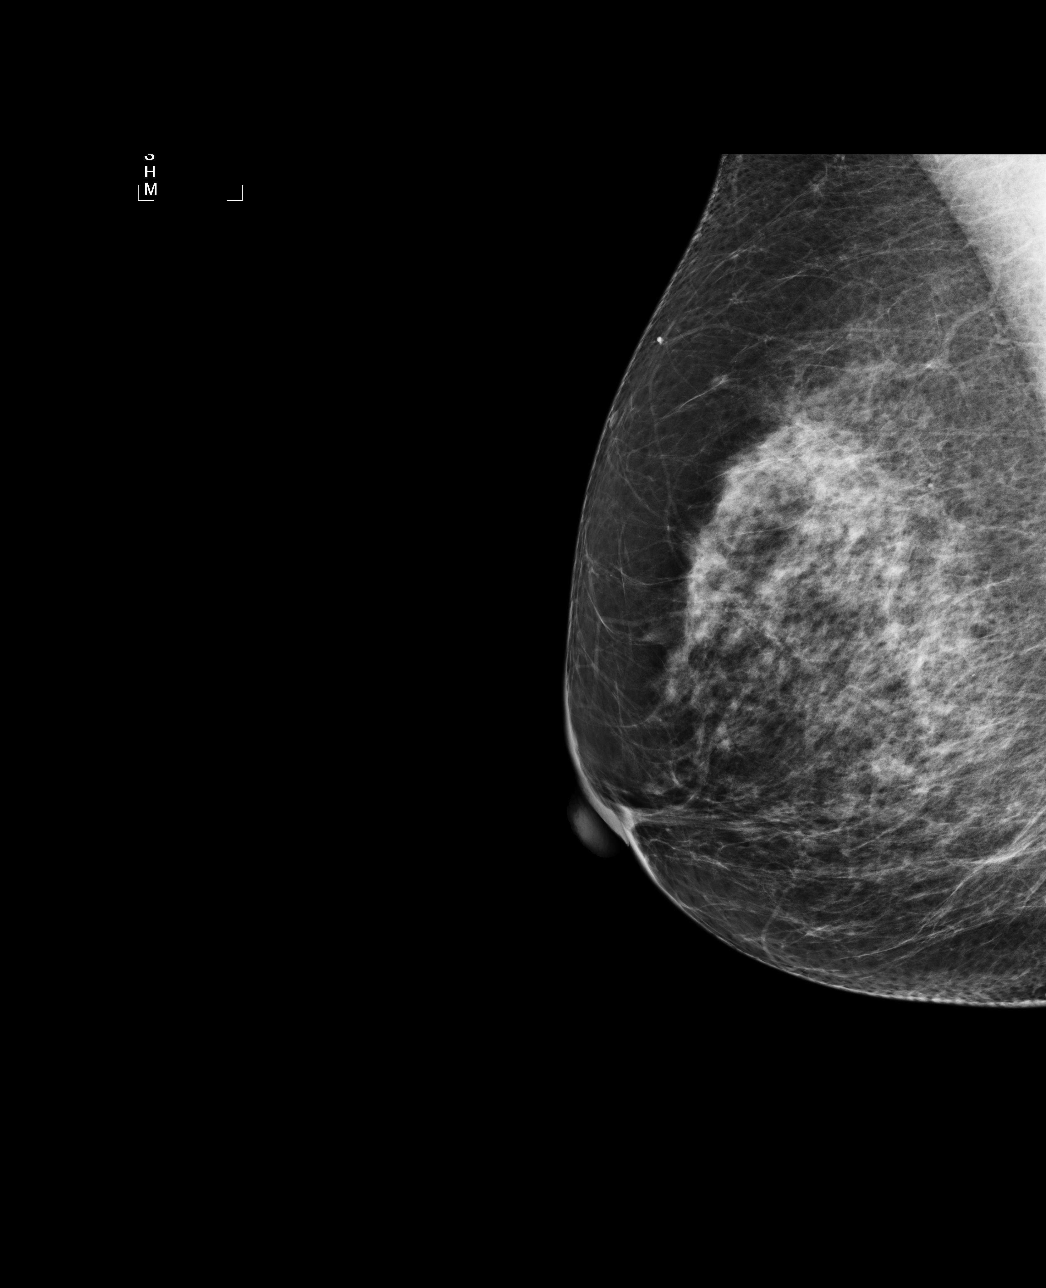

[4 of 4 positions shown; findings below may reference images not displayed]

IMPRESSION: No mammographic evidence of malignancy.  Suggest yearly screening mammography.

ASSESSMENT: Negative - BI-RADS 1

Screening mammogram in 1 year.
ANALYZED BY COMPUTER AIDED DETECTION. , THIS PROCEDURE WAS A DIGITAL MAMMOGRAM.

## 2006-10-06 ENCOUNTER — Encounter: Admission: RE | Admit: 2006-10-06 | Discharge: 2006-10-06 | Payer: Self-pay | Admitting: Internal Medicine

## 2006-10-06 IMAGING — CT CT PELVIS W/ CM
2 of 6 series · 16 of 46 positions shown, 18 images · IV contrast (READICAT/WATER)
Comparison: [DATE].

CLINICAL DATA: Abdominal pain.
 ABDOMEN CT WITHOUT AND WITH CONTRAST:
TECHNIQUE: Multidetector CT imaging of the abdomen was performed both before and during bolus administration of intravenous contrast.
 Contrast:  100 cc of Omnipaque 300.
TECHNIQUE: Multidetector CT imaging of the pelvis was performed following the standard protocol during bolus administration of intravenous contrast.
 The urinary bladder is unremarkable.  The patient has undergone hysterectomy previously.  No adnexal lesion is seen.  No pelvic mass or adenopathy is seen.  The abdominal aorta is rather ectatic.  Degenerative disk disease is noted, particularly at L5-S1.  As noted previously, the majority of the colon has been removed with only the rectosigmoid colon remaining.

[Series 4: routine abdomen · axial · 0.70mm/px · z∈[-349,-9]mm · 13 of 76 slices shown, 15 images]
[im 6/76  soft-tissue]
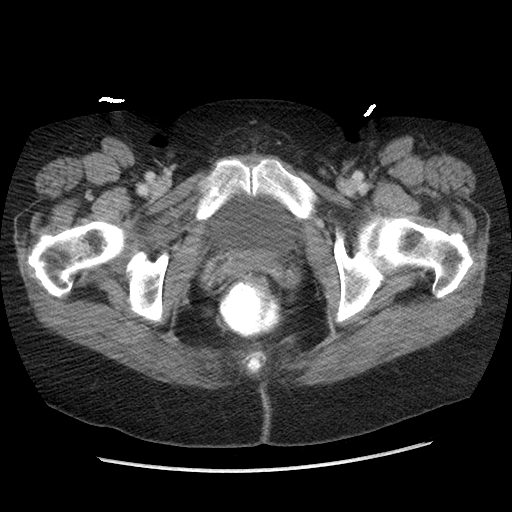
[im 6/76  bone]
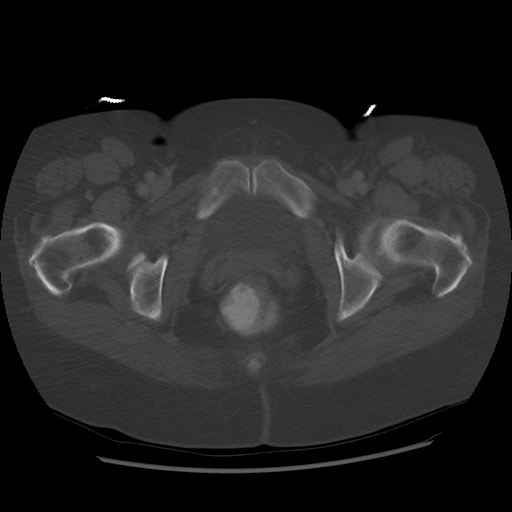
[im 11/76  soft-tissue]
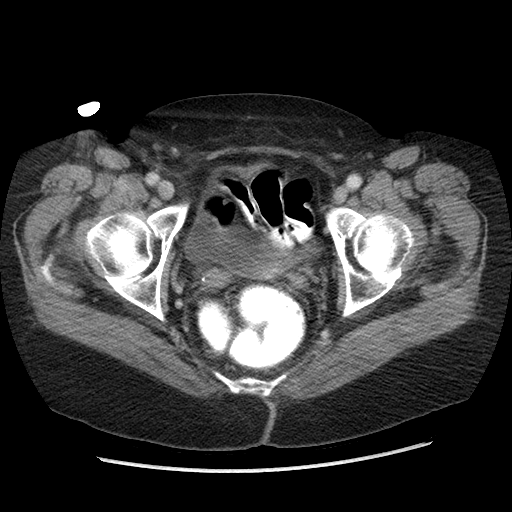
[im 17/76  soft-tissue]
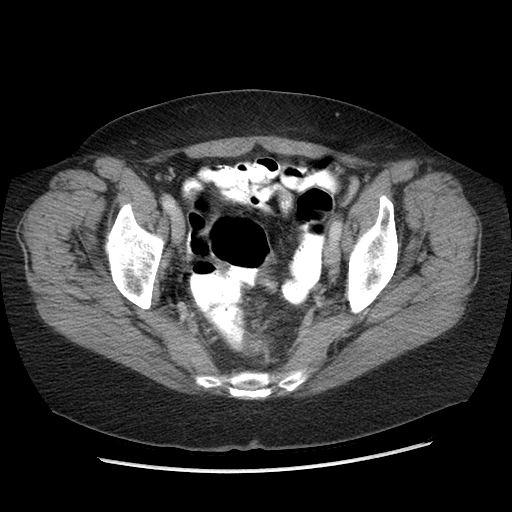
[im 22/76  soft-tissue]
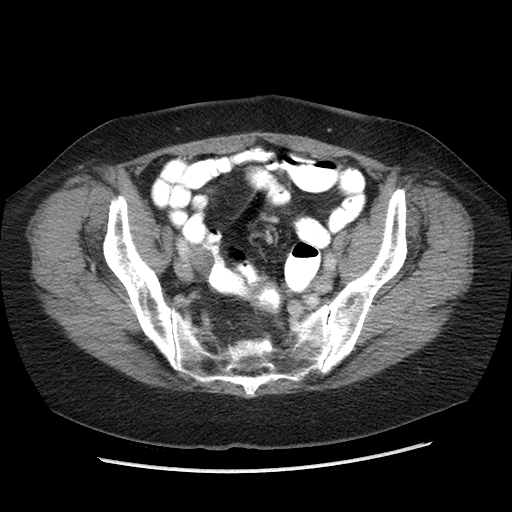
[im 27/76  soft-tissue]
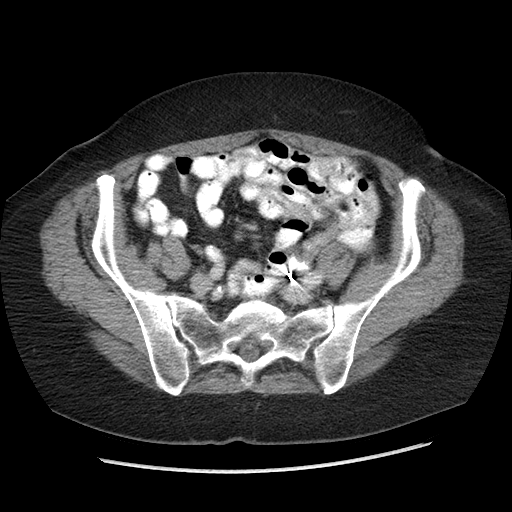
[im 33/76  soft-tissue]
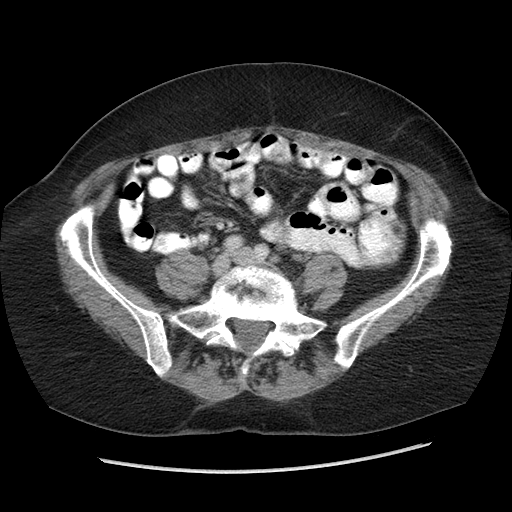
[im 38/76  soft-tissue]
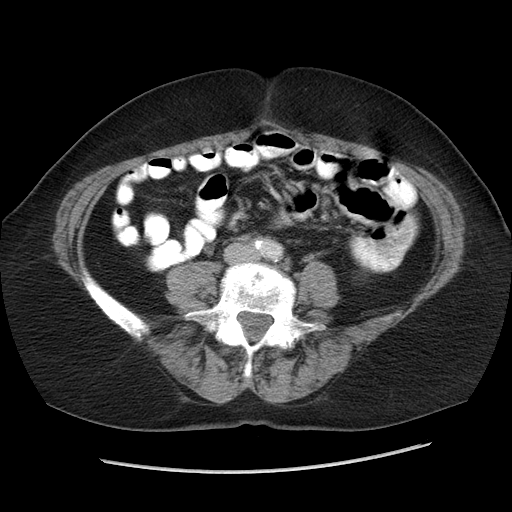
[im 43/76  soft-tissue]
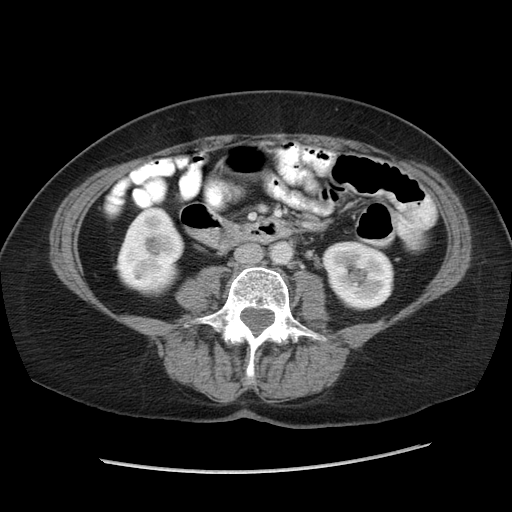
[im 49/76  soft-tissue]
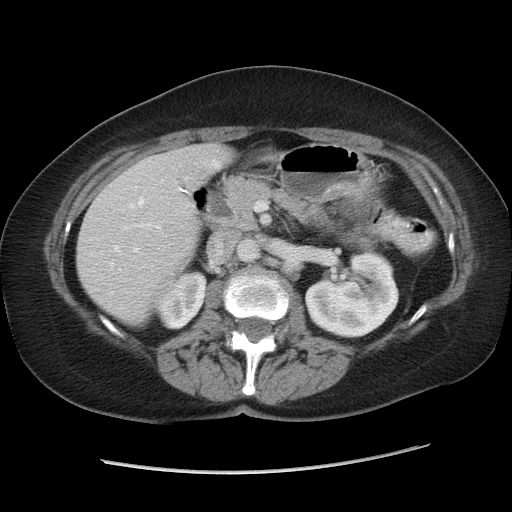
[im 49/76  bone]
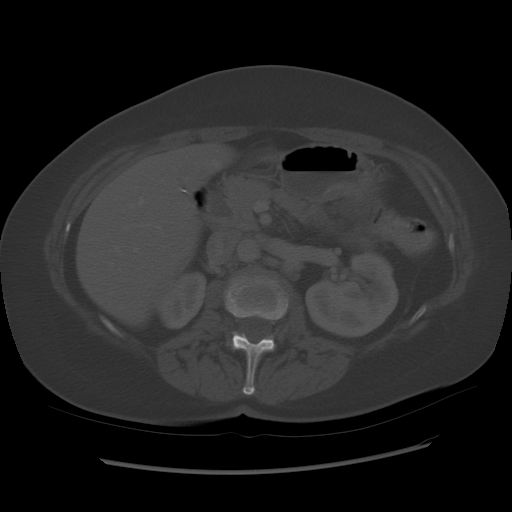
[im 54/76  soft-tissue]
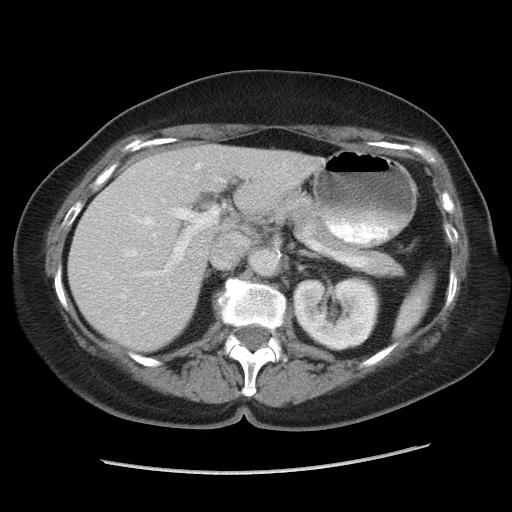
[im 59/76  soft-tissue]
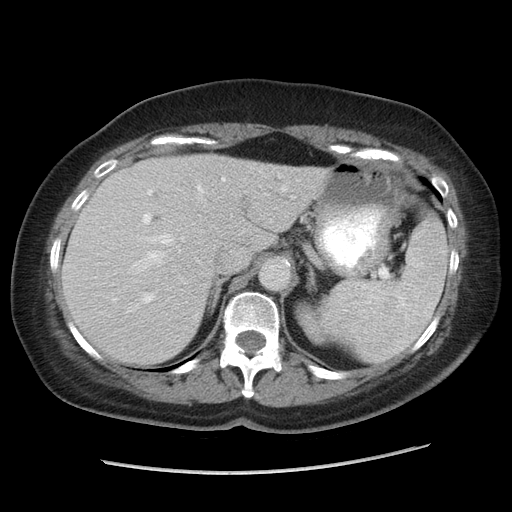
[im 65/76  soft-tissue]
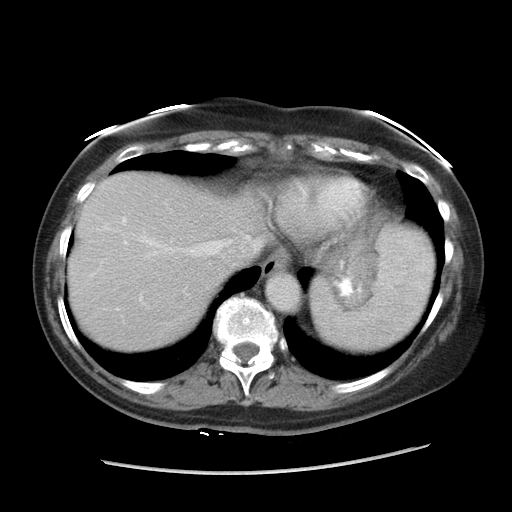
[im 70/76  soft-tissue]
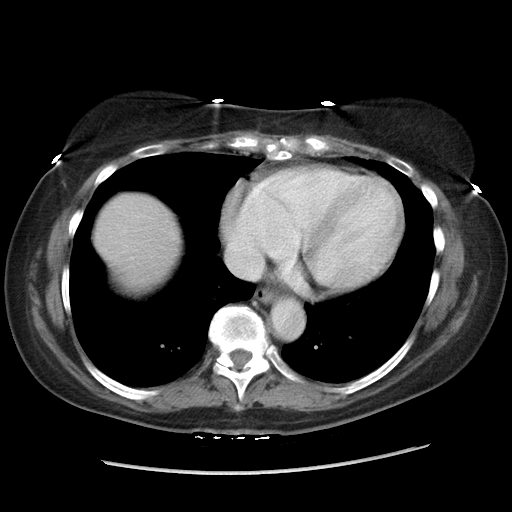

[Series 602: sagittal body · sagittal · 0.90mm/px · 3 of 145 slices shown]
[im 49/145  soft-tissue]
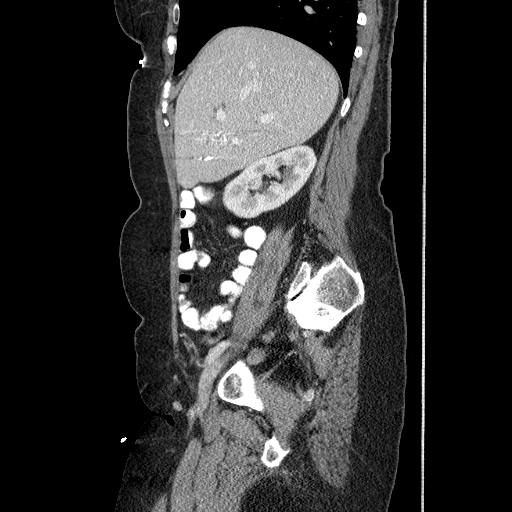
[im 65/145  soft-tissue]
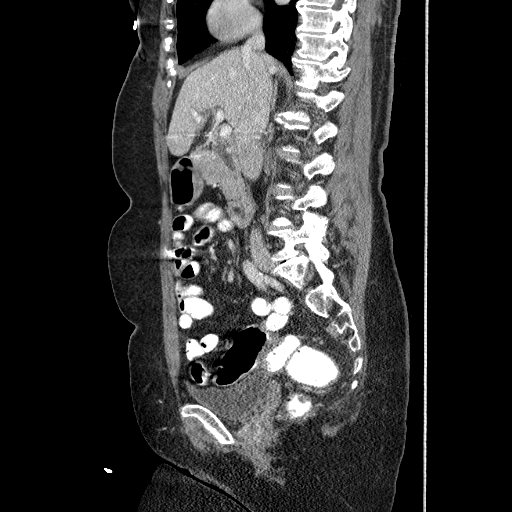
[im 81/145  soft-tissue]
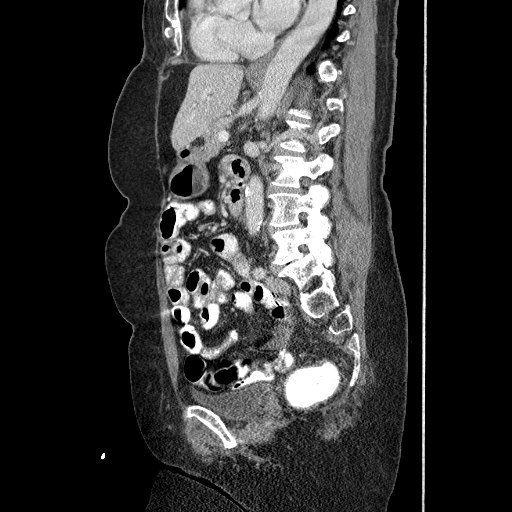

[16 of 46 positions shown; findings below may reference images not displayed]

The lung bases are clear.  On the unenhanced study, no renal calculi are seen.  Surgical clips are present from prior cholecystectomy.  
 After contrast administration, the liver enhances with no focal abnormality.  Slight prominence of intrahepatic ducts in the left lobe are stable status post cholecystectomy.  Common bile duct at the ampulla remains prominent and stable.  No pancreatic ductal dilatation is seen, and the peripancreatic fat planes appear normal.  The adrenal glands and spleen appear normal.  The kidneys enhance and, on delayed images, the pelvocaliceal systems appear normal.  The abdominal aorta is normal in caliber.
IMPRESSION: Stable CT of the abdomen.  Prior cholecystectomy with no change in slightly prominent intrahepatic ducts, particularly the left lobe, when compared to a CT of [DATE], most likely within normal limits post cholecystectomy.
 PELVIS CT WITH CONTRAST:
IMPRESSION: No acute abnormality on CT of the pelvis.  Much of the colon has previously been resected.

## 2006-10-16 ENCOUNTER — Encounter: Admission: RE | Admit: 2006-10-16 | Discharge: 2006-10-16 | Payer: Self-pay | Admitting: Internal Medicine

## 2006-10-16 IMAGING — US US TRANSVAGINAL NON-OB
1 series · 14 of 25 positions shown · non-contrast
Comparison: CT abdomen [DATE].

CLINICAL DATA: Right lower quadrant pain.  
 TRANSABDOMINAL/TRANSVAGINAL PELVIC ULTRASOUND:
TECHNIQUE: Both transabdominal and transvaginal ultrasound examinations of the pelvis were performed including evaluation of the uterus, ovaries, adnexal regions, and pelvic cul-de-sac.

[Series 1: unknown · 0.37mm/px · 14 of 51 slices shown]
[im 1/51]
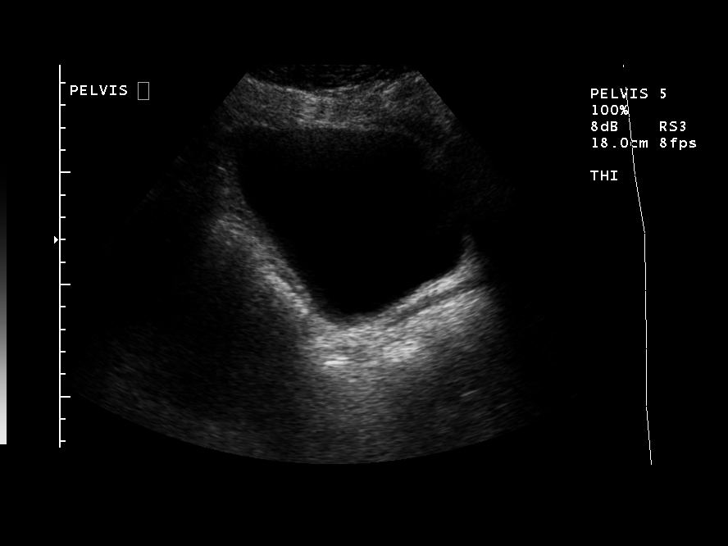
[im 5/51]
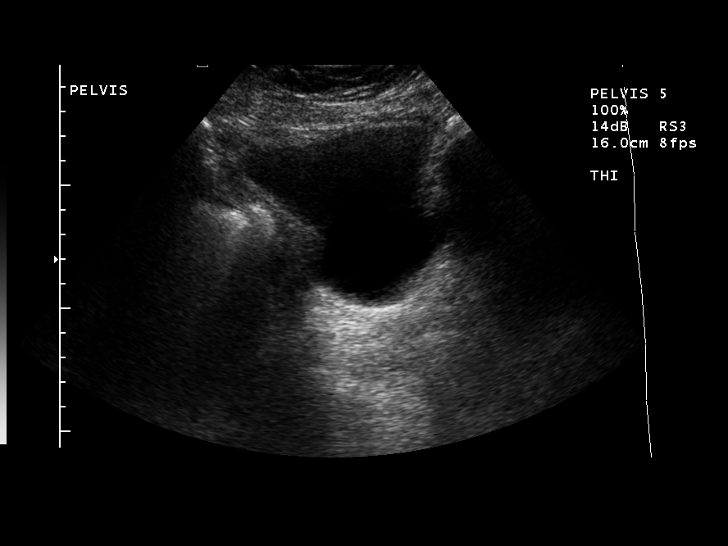
[im 9/51]
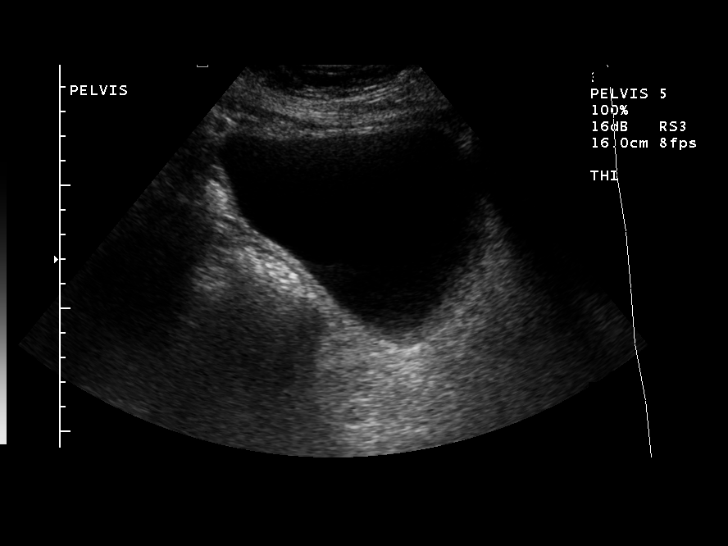
[im 13/51]
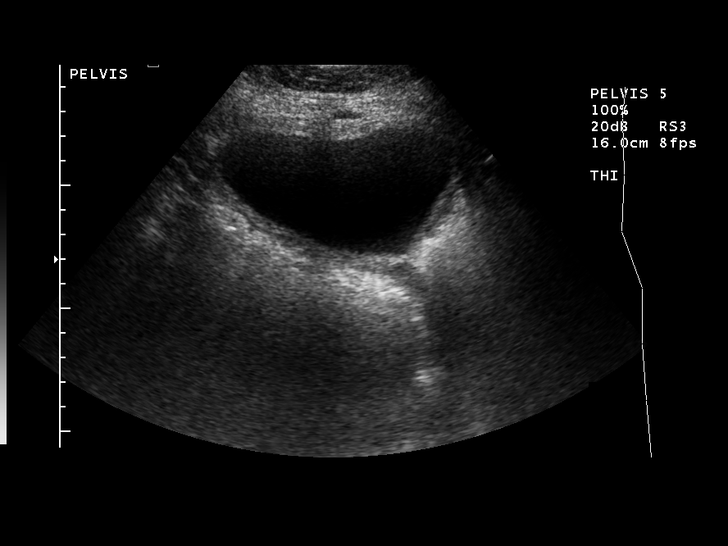
[im 17/51]
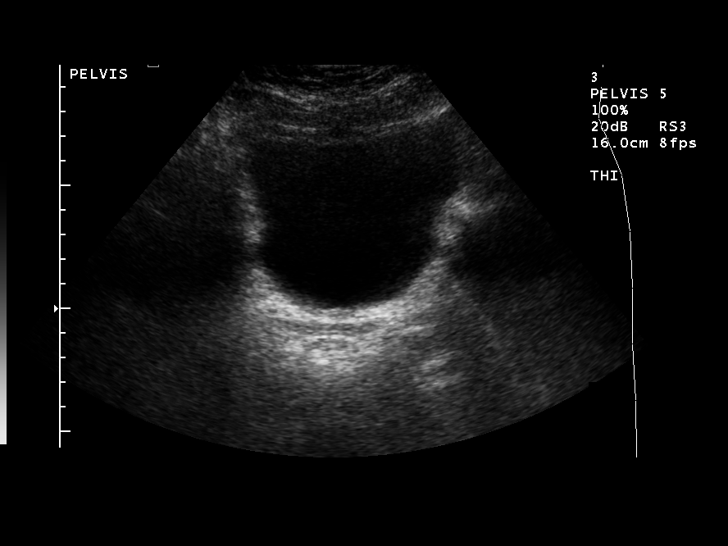
[im 19/51]
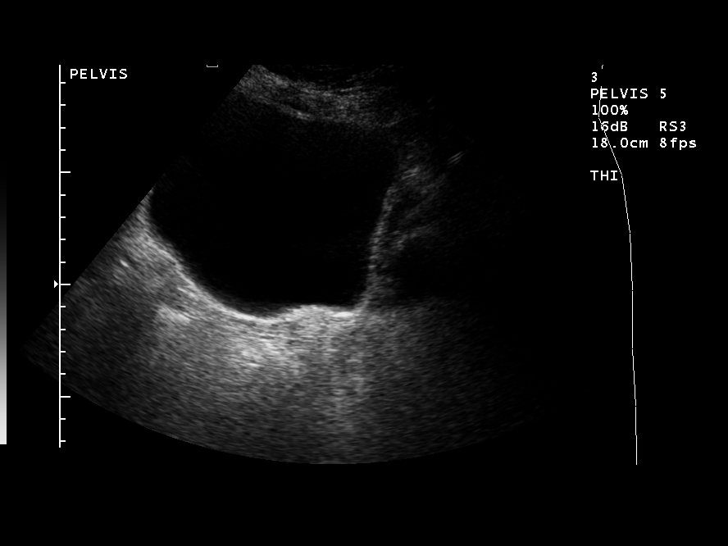
[im 23/51]
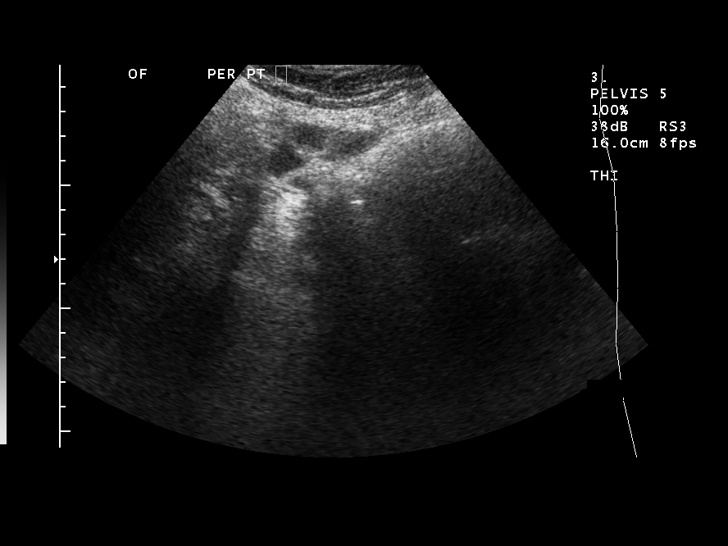
[im 28/51]
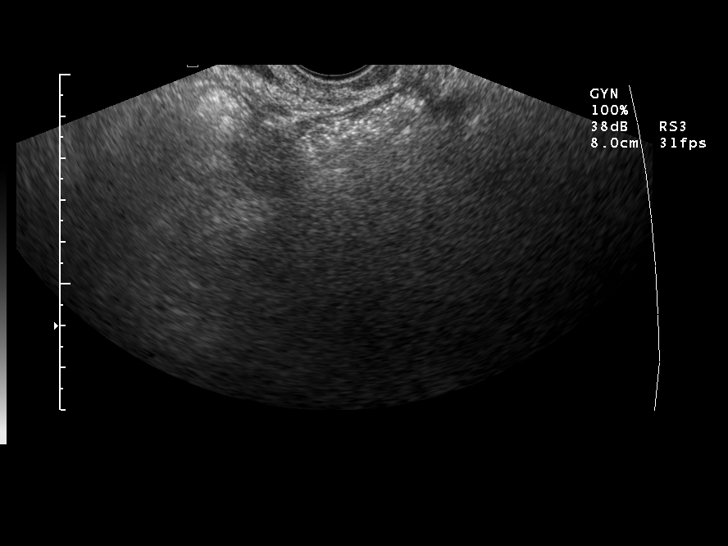
[im 32/51]
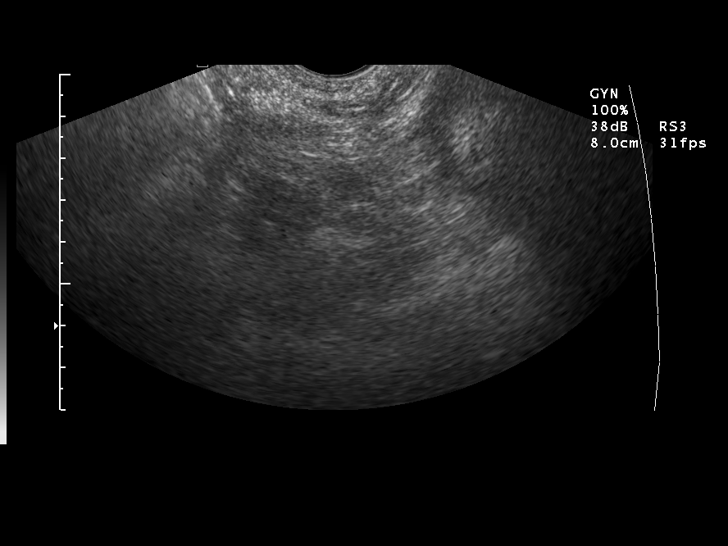
[im 34/51]
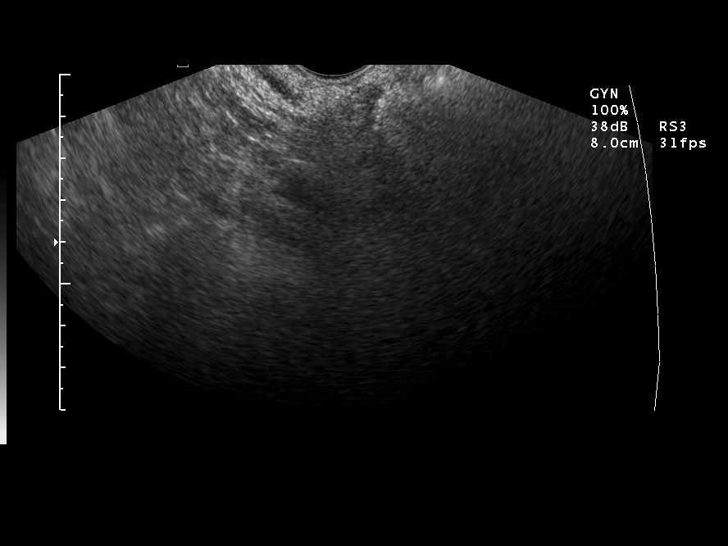
[im 38/51]
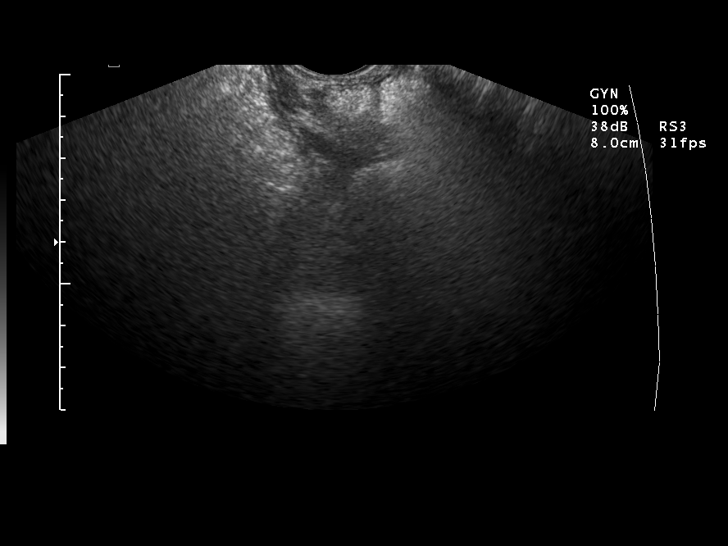
[im 42/51]
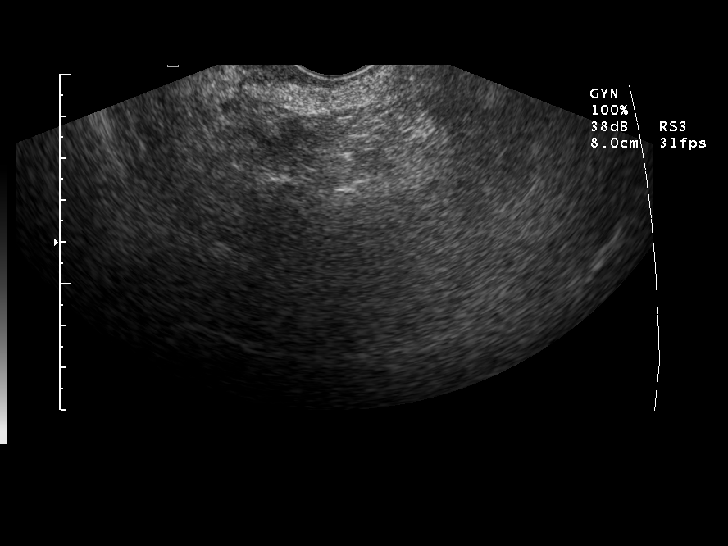
[im 46/51]
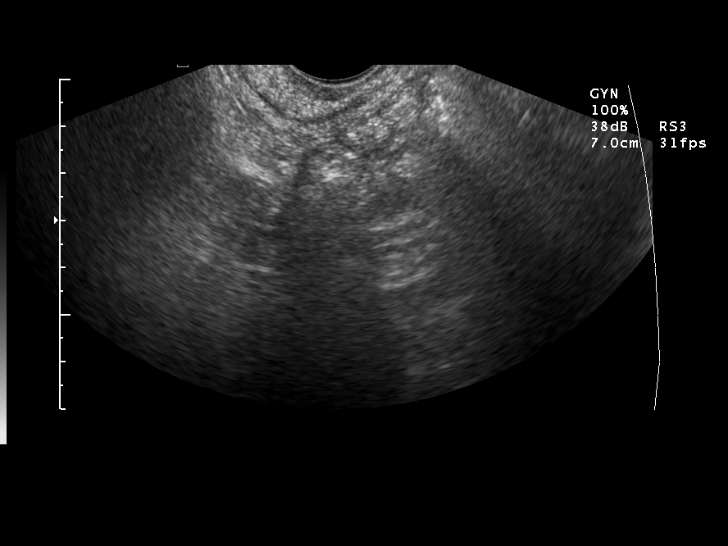
[im 51/51]
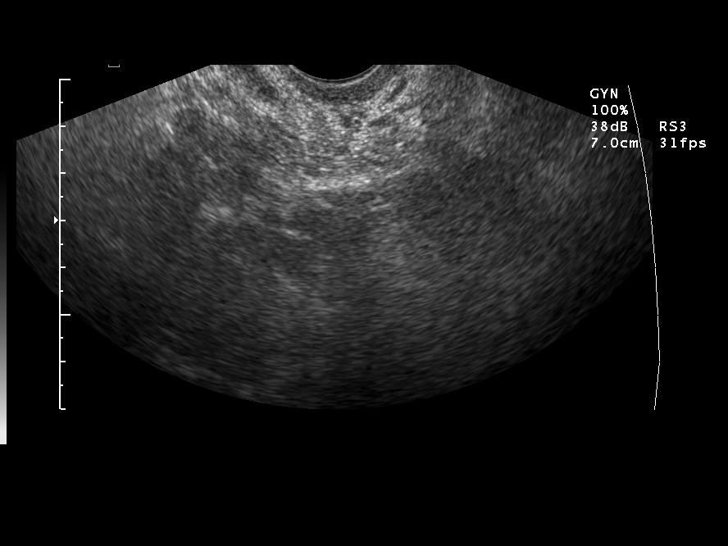

[14 of 25 positions shown; findings below may reference images not displayed]

FINDINGS: The uterus has been removed.  Ovaries are not visualized.  No solid or cystic mass is seen in the area of patient?s pain.  No free fluid.   The bladder is unremarkable.
IMPRESSION: No findings to explain the patient?s pain.

## 2006-12-14 ENCOUNTER — Other Ambulatory Visit: Admission: RE | Admit: 2006-12-14 | Discharge: 2006-12-14 | Payer: Self-pay | Admitting: *Deleted

## 2007-03-23 ENCOUNTER — Encounter: Admission: RE | Admit: 2007-03-23 | Discharge: 2007-03-23 | Payer: Self-pay | Admitting: *Deleted

## 2007-03-23 IMAGING — MG MM SCREEN MAMMOGRAM BILATERAL
4 series · 4 of 4 positions shown · non-contrast
Comparison: Prior studies.

DG SCREEN MAMMOGRAM BILATERAL
Bilateral CC and MLO view(s) were taken.
Prior study comparison: [DATE], bilateral screening mammogram.

DIGITAL SCREENING MAMMOGRAM WITH CAD:

[R CC]
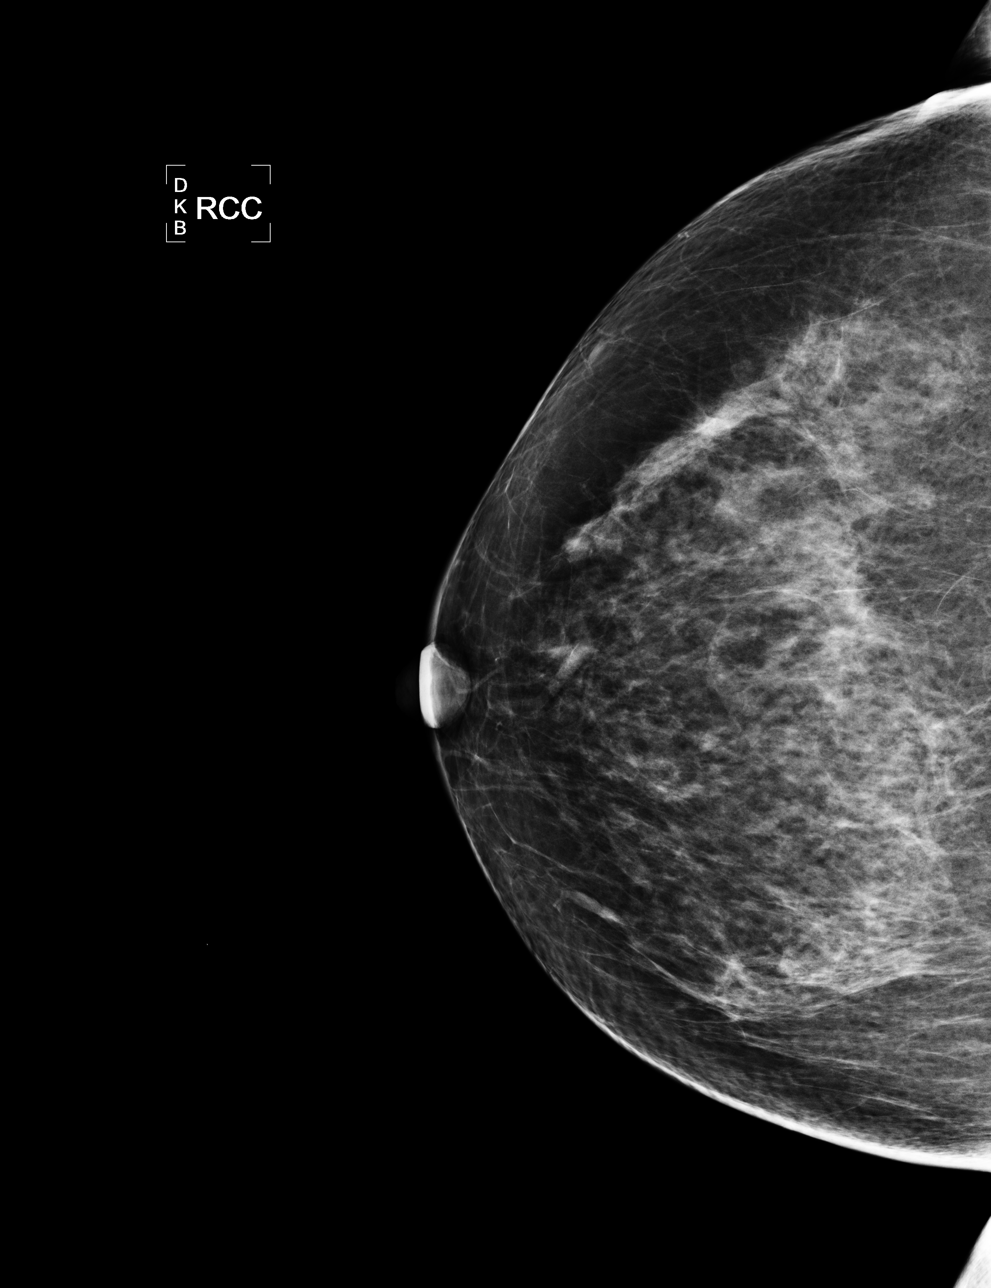

[L CC]
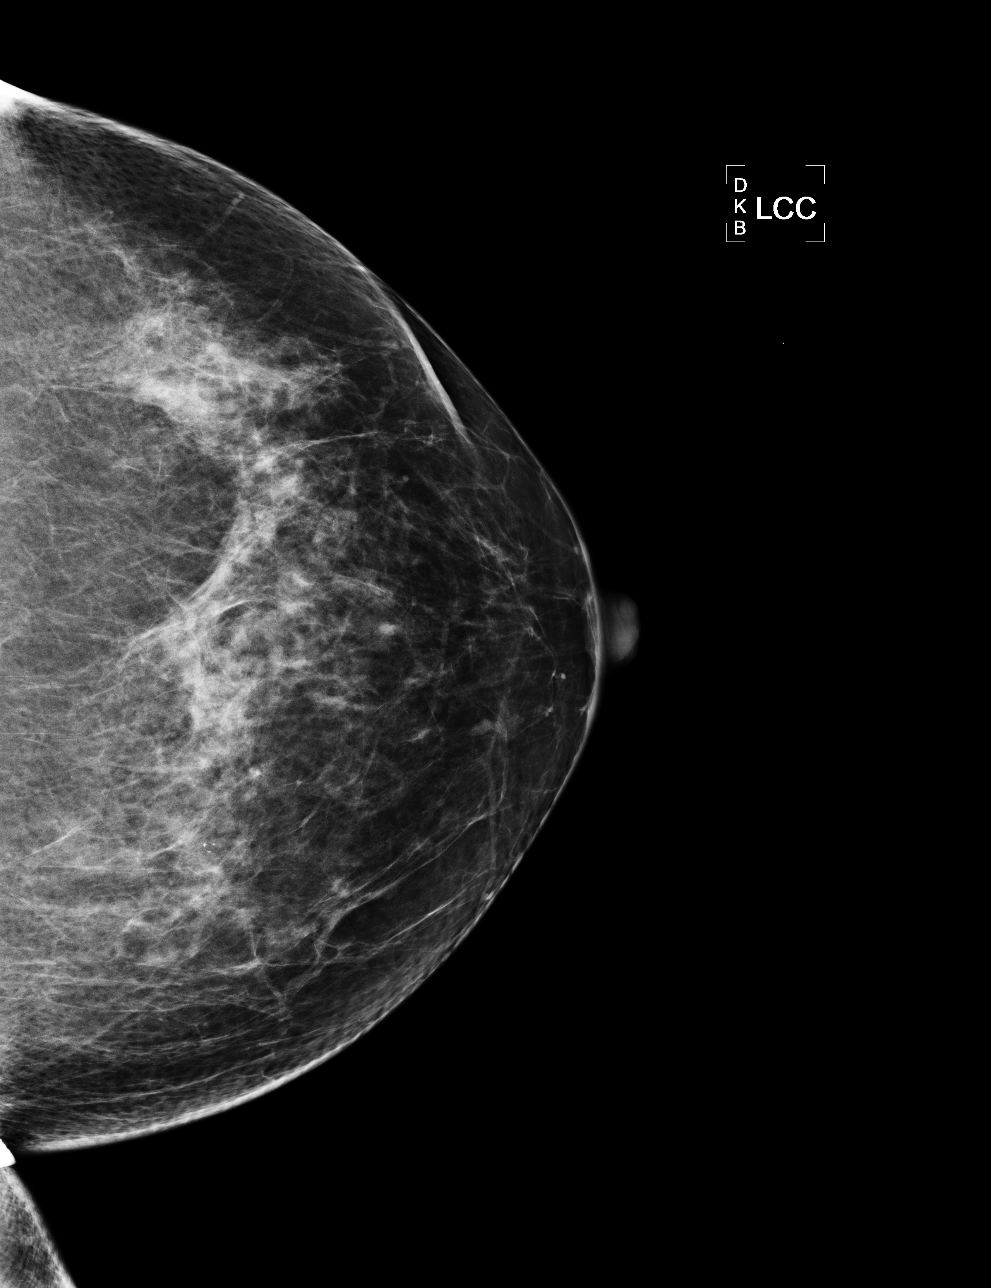

[L MLO]
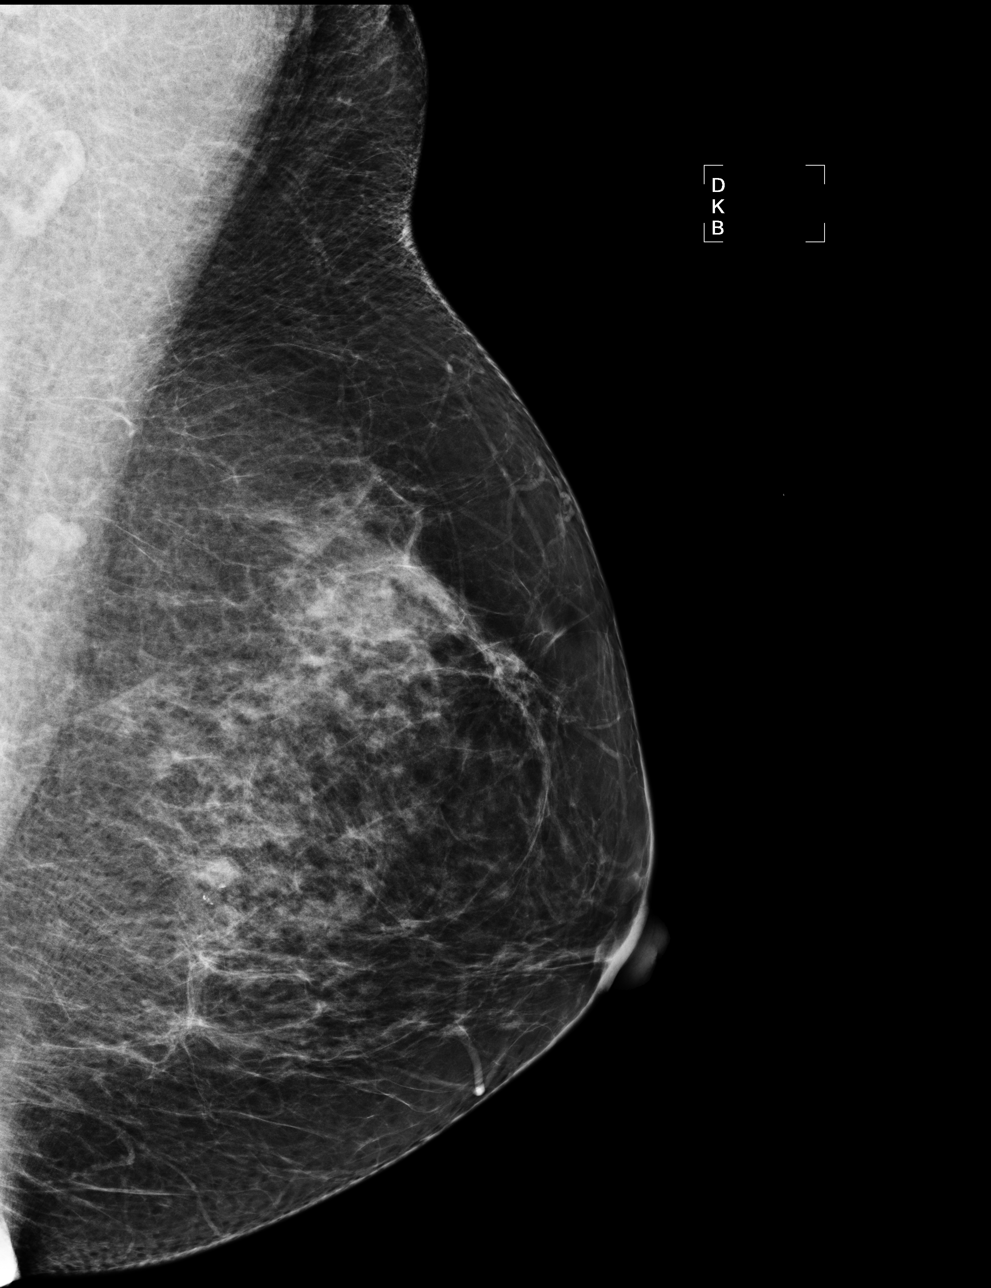

[R MLO]
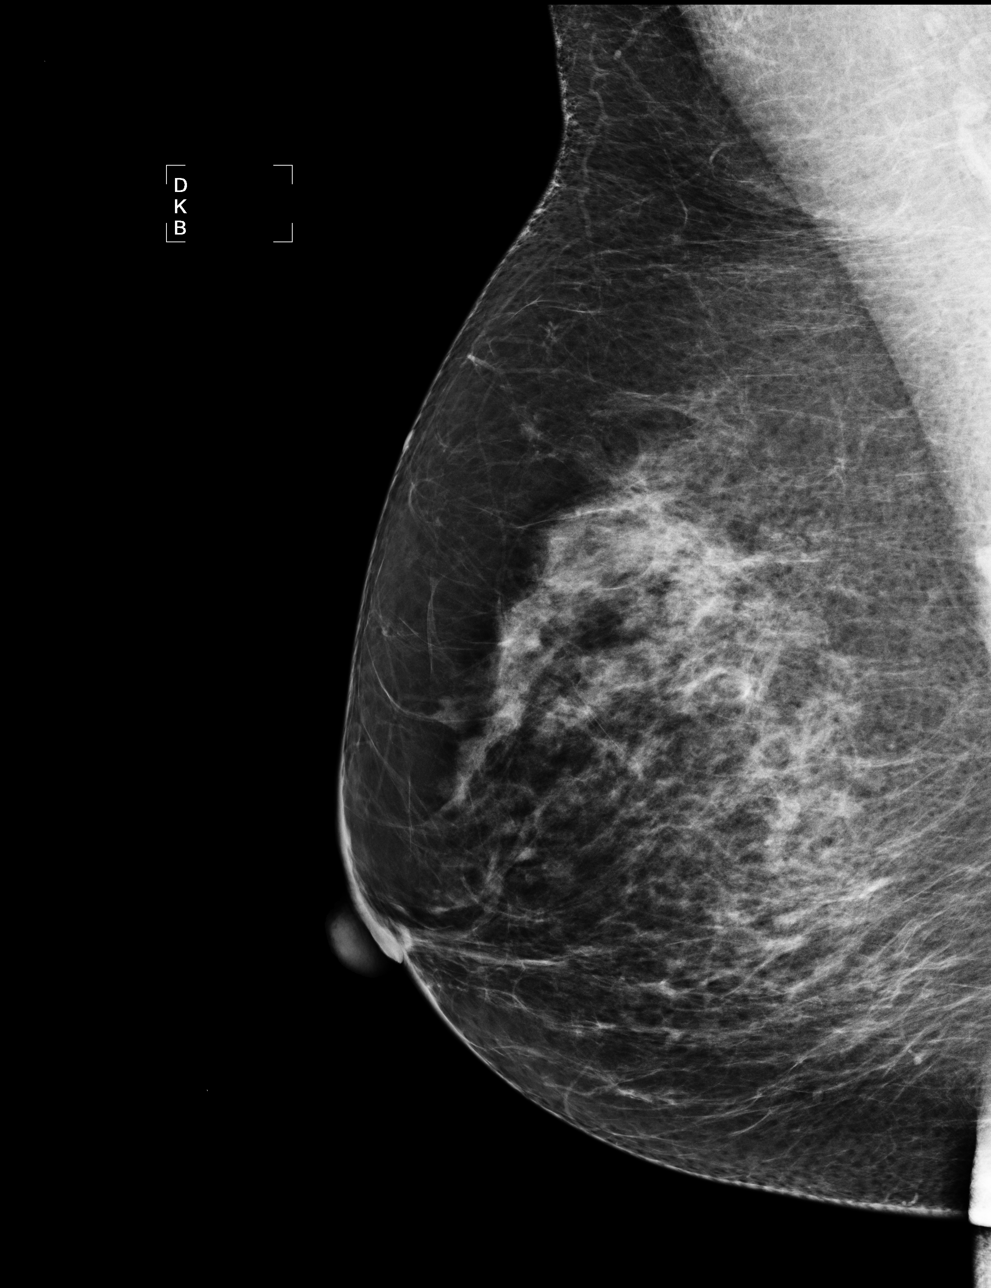

[4 of 4 positions shown; findings below may reference images not displayed]

There are scattered fibroglandular densities.  A possible mass is noted in the right breast.  Spot 
compression views and possibly sonography are recommended for further evaluation.  The left breast 
is unremarkable.
IMPRESSION: Possible mass, right breast.  Additional evaluation is indicated.  The patient will be contacted 
for additional studies and a supplemental report will follow.

ASSESSMENT: Need additional imaging evaluation and/or prior mammograms for comparison - BI-RADS 0 -
Right

Further imaging of the right breast.
ANALYZED BY COMPUTER AIDED DETECTION. , THIS PROCEDURE WAS A DIGITAL MAMMOGRAM.

## 2007-03-28 ENCOUNTER — Encounter: Admission: RE | Admit: 2007-03-28 | Discharge: 2007-03-28 | Payer: Self-pay | Admitting: *Deleted

## 2007-03-28 IMAGING — MG MM DIAGNOSTIC LTD RIGHT
3 series · 3 of 3 positions shown · non-contrast
Comparison: none

[REDACTED] RIGHT
CC and MLO view(s) were taken of the right breast.

RIGHT BREAST ULTRASOUND
DIGITAL LIMITED RIGHT DIAGNOSTIC MAMMOGRAM AND RIGHT BREAST ULTRASOUND:
CLINICAL DATA: 66-year-old with further evaluation of the right breast based on recent screening 
mammogram [DATE].  Comparison also [DATE] and [DATE].

[R CC (1 of 2)]
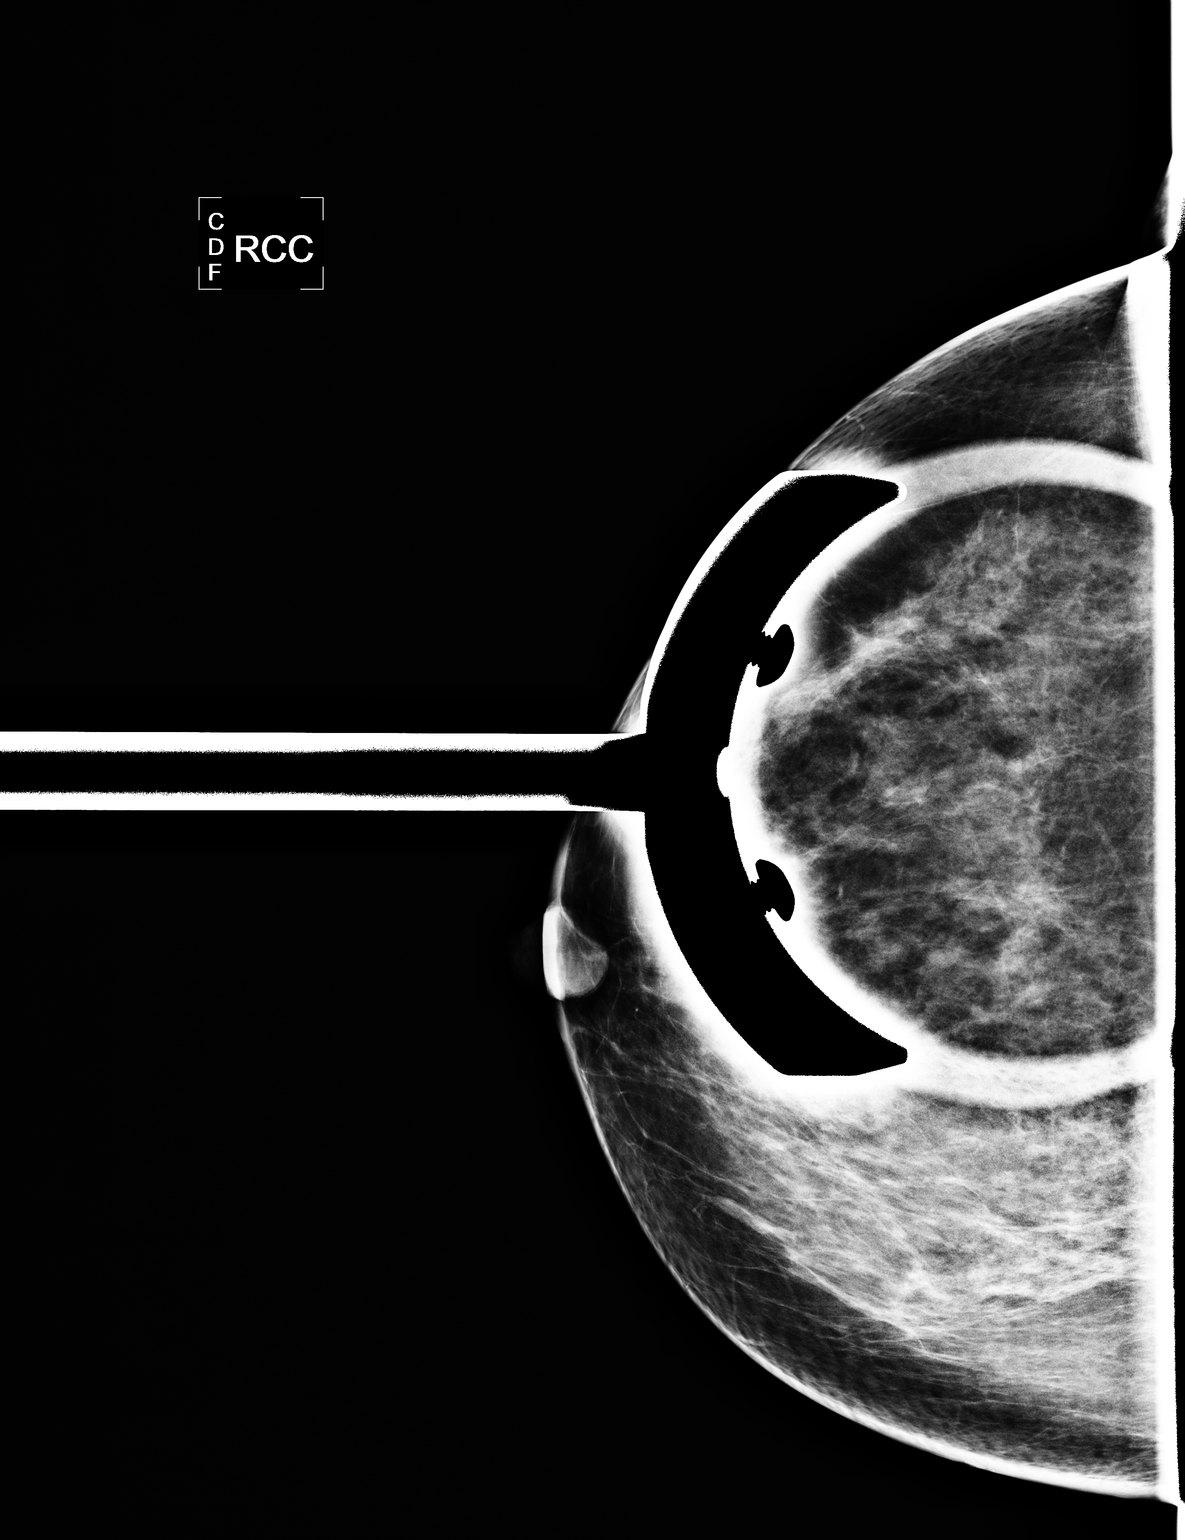

[R CC (2 of 2)]
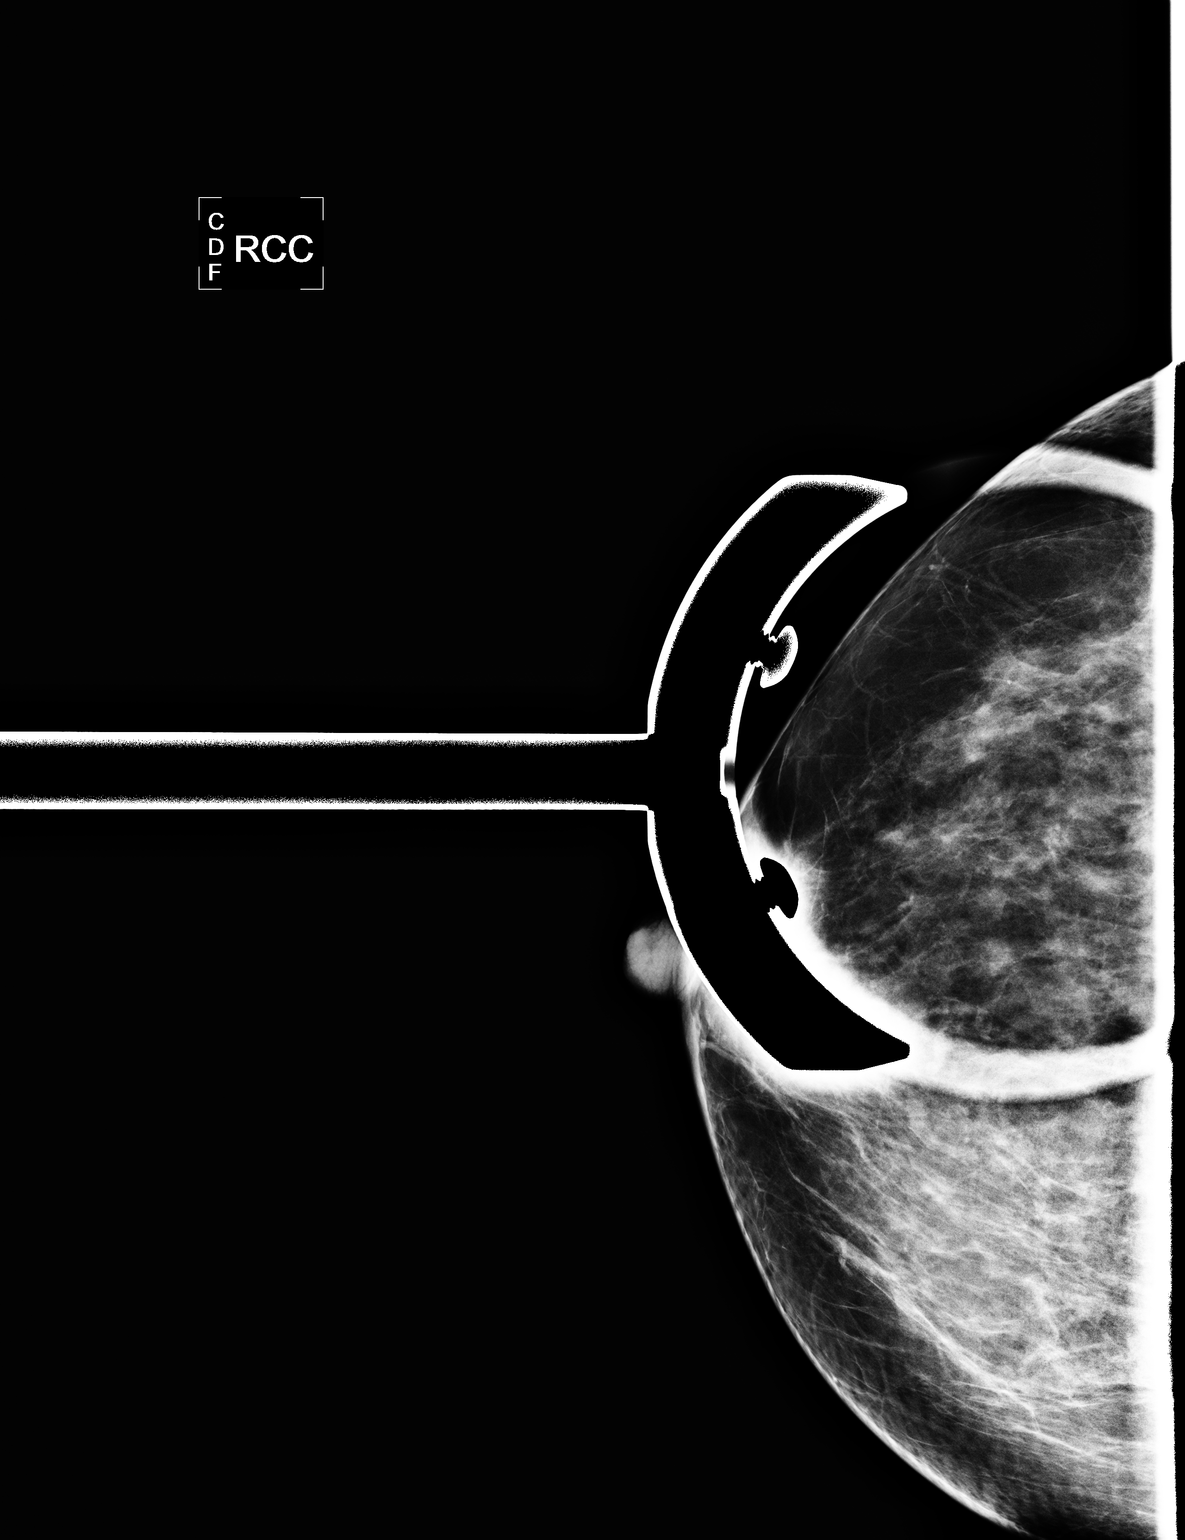

[R ML]
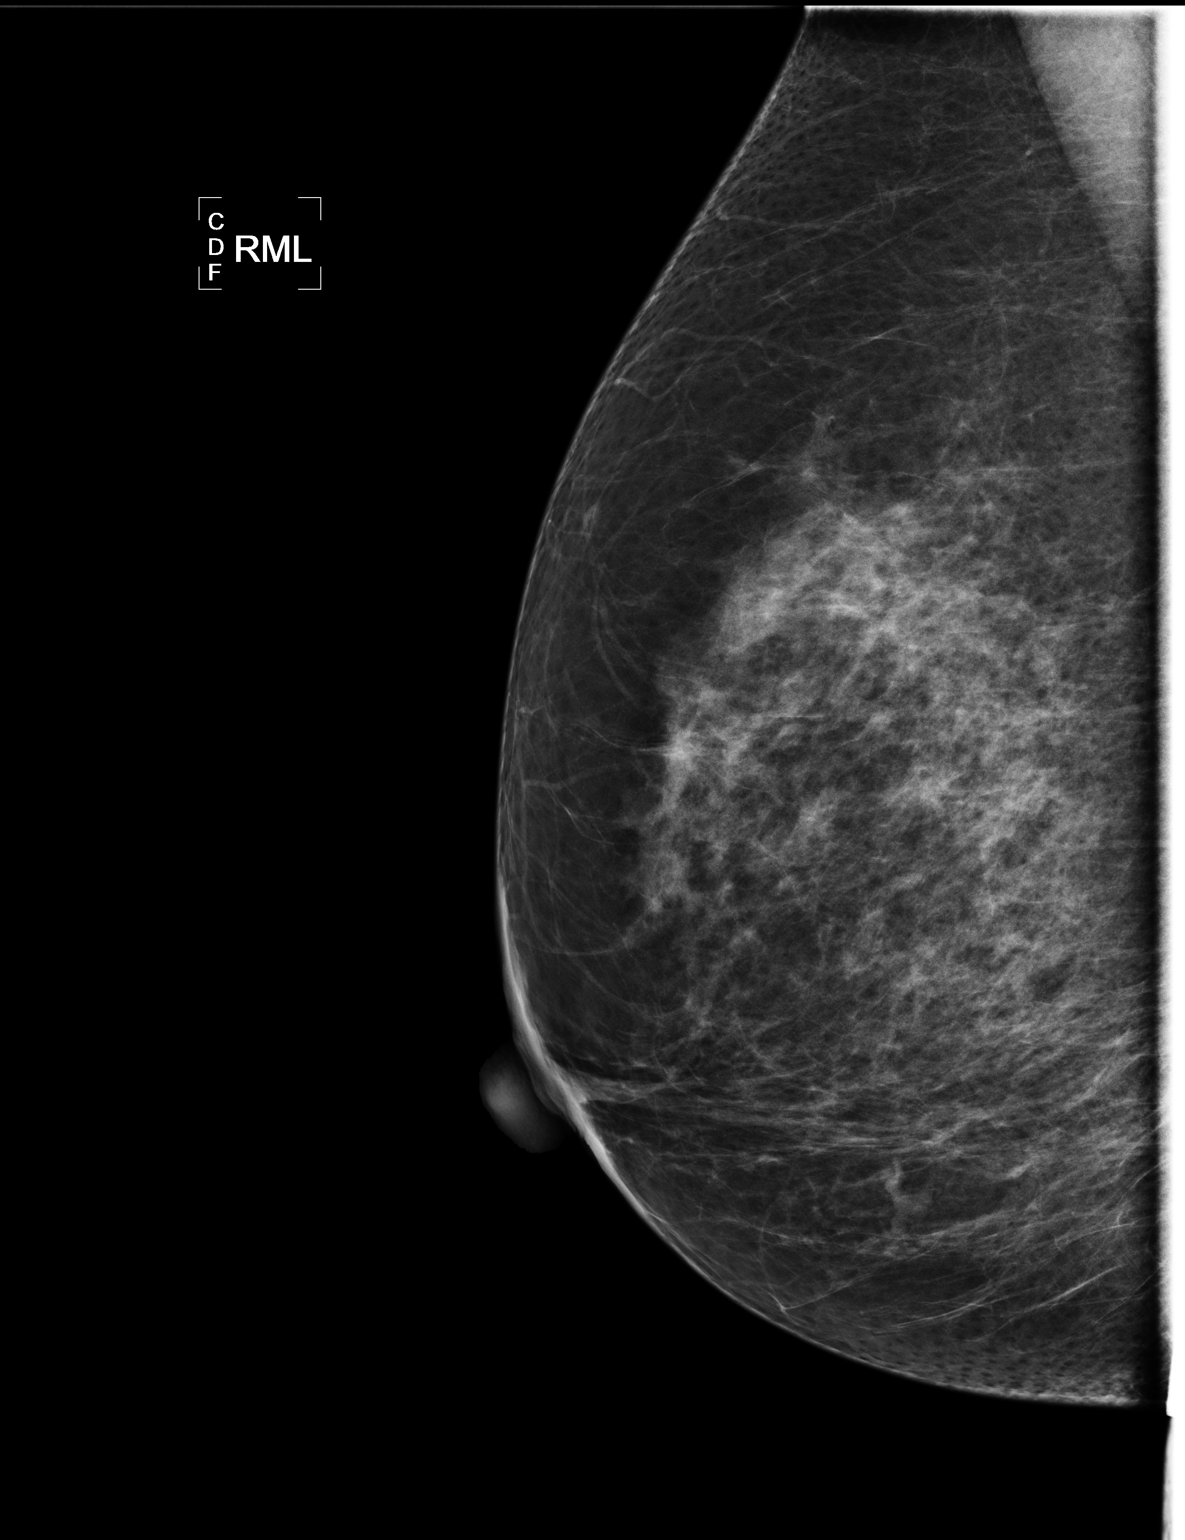

[3 of 3 positions shown; findings below may reference images not displayed]

Breast parenchyma is heterogeneously dense.  Spot compression views are performed of the lateral 
portion of the right breast showing normal-appearing fibroglandular parenchyma without definite 
mass or distortion.

On physical exam, I do not palpate an abnormality in the lateral aspect of the right breast.  
Ultrasound is performed showing normal-appearing fibroglandular parenchyma in the lateral 
quadrants.  No mass or distortion identified.
IMPRESSION: No evidence for malignancy right breast.  Annual mammography is recommended.

ASSESSMENT: Negative - BI-RADS 1

Screening mammogram of both breasts in 1 year.
,

## 2008-04-22 ENCOUNTER — Encounter: Admission: RE | Admit: 2008-04-22 | Discharge: 2008-04-22 | Payer: Self-pay | Admitting: Family Medicine

## 2008-04-22 IMAGING — MG MM SCREEN MAMMOGRAM BILATERAL
5 series · 5 of 5 positions shown · non-contrast
Comparison: none

DG SCREEN MAMMOGRAM BILATERAL
Bilateral CC and MLO view(s) were taken.
Technologist: KAMISHA

DIGITAL SCREENING MAMMOGRAM WITH CAD:
The breast tissue is heterogeneously dense.  No masses or malignant type calcifications are 
identified.  Compared with prior studies.

[R CC]
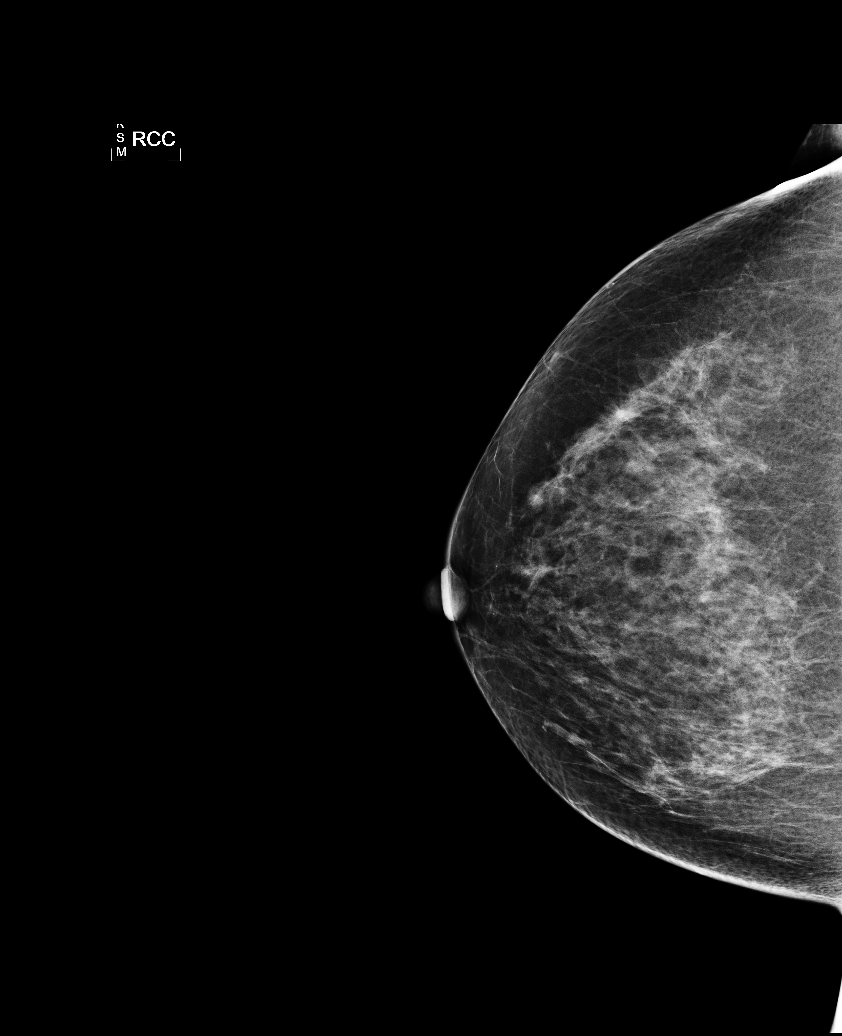

[L CC]
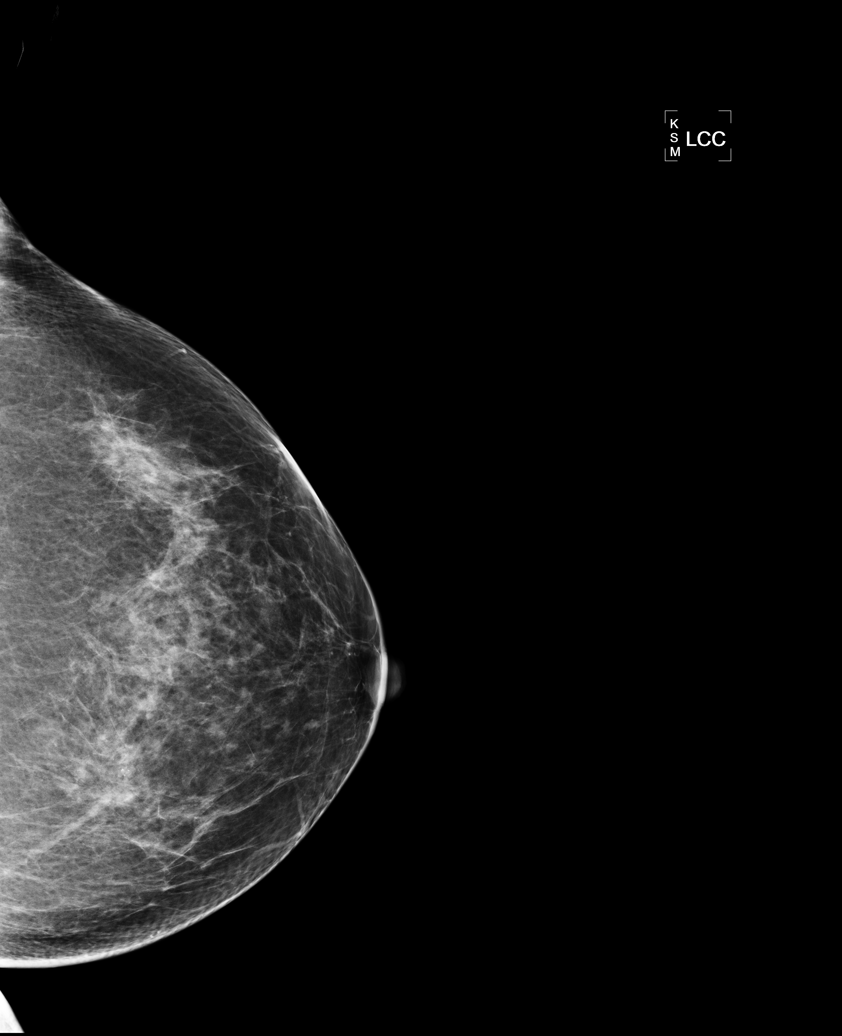

[L MLO]
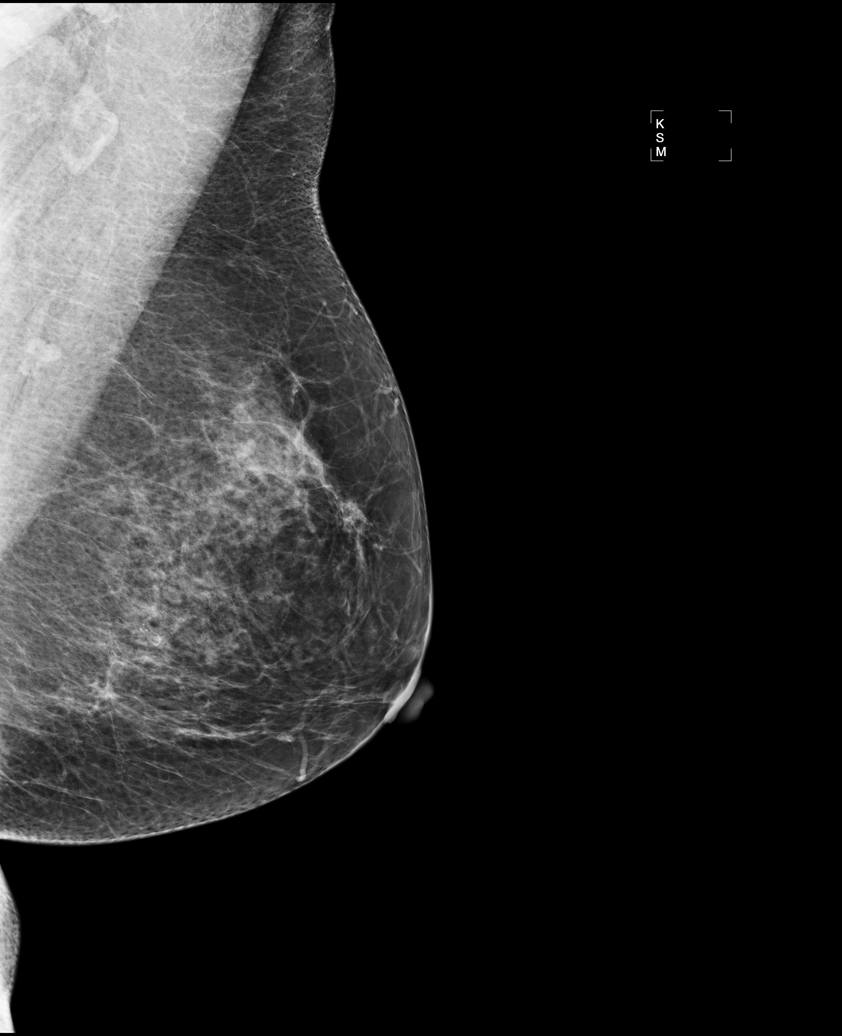

[R MLO (1 of 2)]
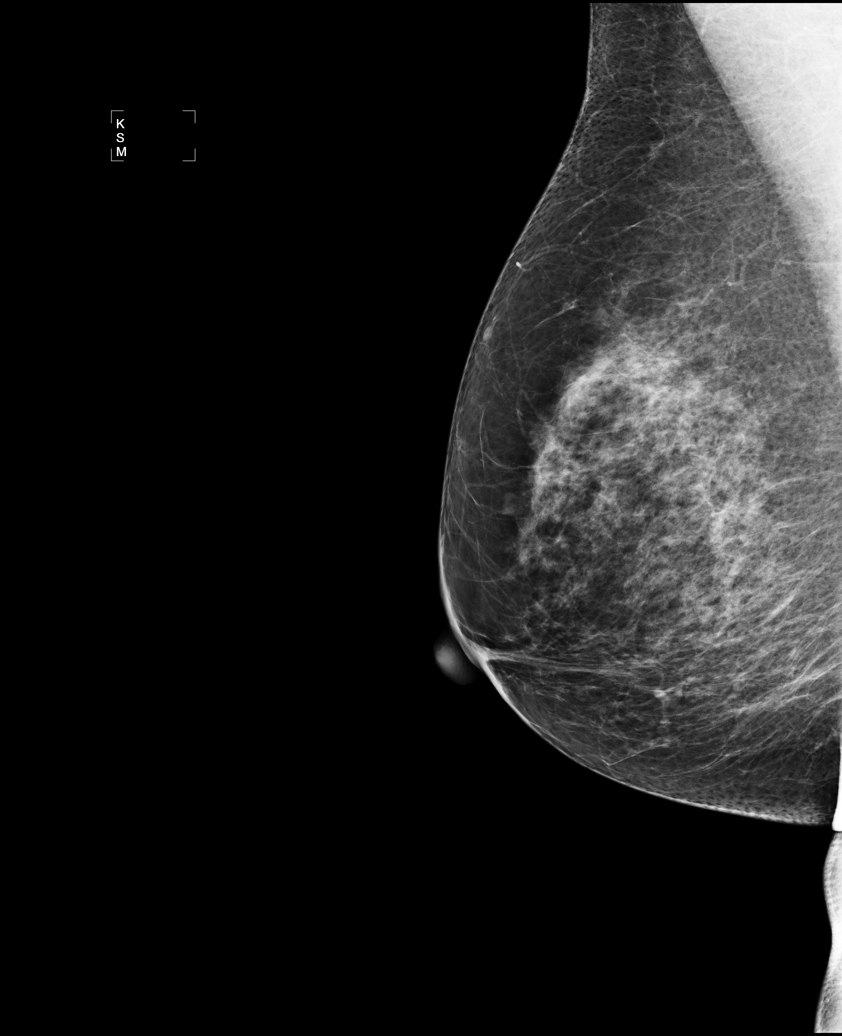

[R MLO (2 of 2)]
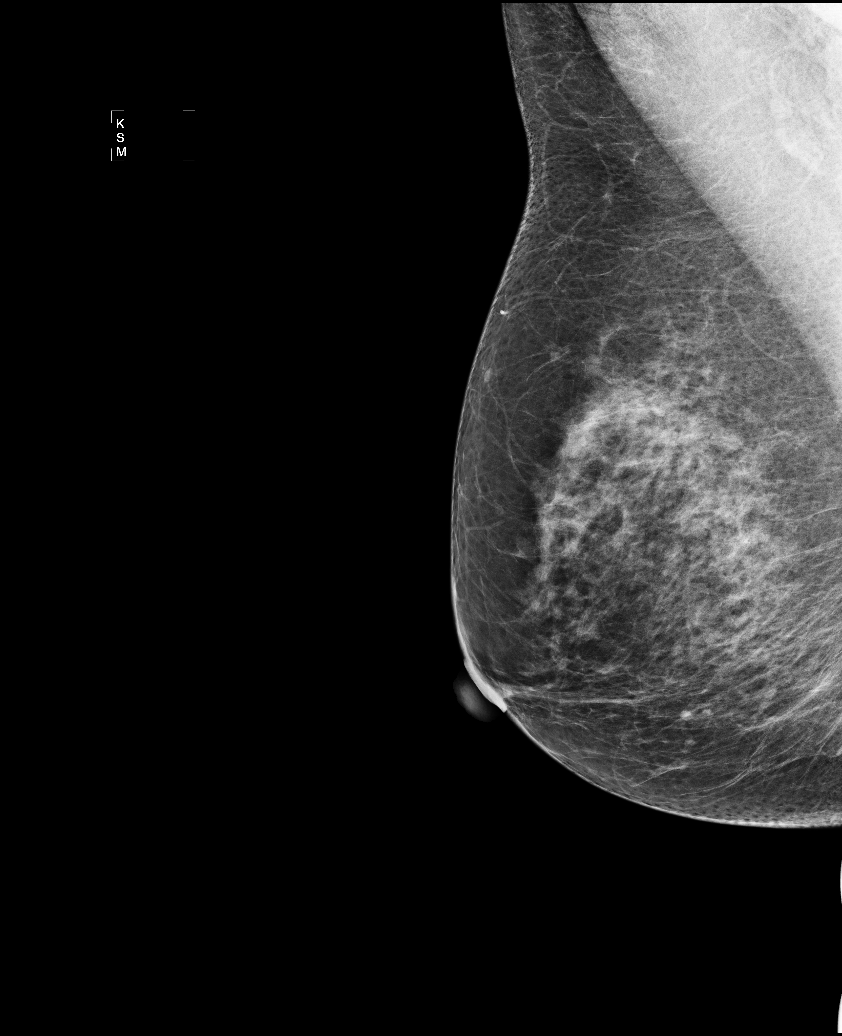

[5 of 5 positions shown; findings below may reference images not displayed]

IMPRESSION: No specific mammographic evidence of malignancy.  Next screening mammogram is recommended in one 
year.

ASSESSMENT: Negative - BI-RADS 1

Screening mammogram in 1 year.
ANALYZED BY COMPUTER AIDED DETECTION. , THIS PROCEDURE WAS A DIGITAL MAMMOGRAM.

## 2008-08-08 DIAGNOSIS — I671 Cerebral aneurysm, nonruptured: Secondary | ICD-10-CM | POA: Insufficient documentation

## 2008-08-08 HISTORY — PX: ANEURYSM COILING: SHX5349

## 2008-08-08 HISTORY — DX: Cerebral aneurysm, nonruptured: I67.1

## 2009-01-03 ENCOUNTER — Emergency Department (HOSPITAL_COMMUNITY): Admission: EM | Admit: 2009-01-03 | Discharge: 2009-01-04 | Payer: Self-pay | Admitting: Emergency Medicine

## 2009-01-03 IMAGING — CT CT HEAD W/O CM
1 series · 15 of 30 positions shown, 19 images · non-contrast
Comparison: None

CLINICAL DATA: Headache for 1 day

CT HEAD WITHOUT CONTRAST
TECHNIQUE: Contiguous axial images were obtained from the base of
the skull through the vertex without contrast.

[Series 2: headseq 4.8 h45s · axial · 0.43mm/px · z∈[-197,-45]mm · 15 of 36 slices shown, 19 images]
[im 2/36  brain]
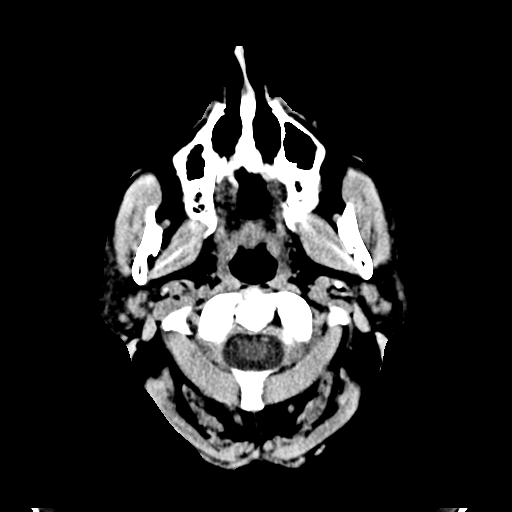
[im 2/36  bone]
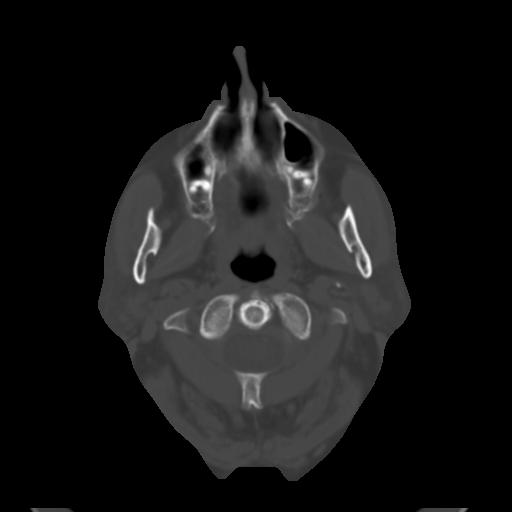
[im 4/36  brain]
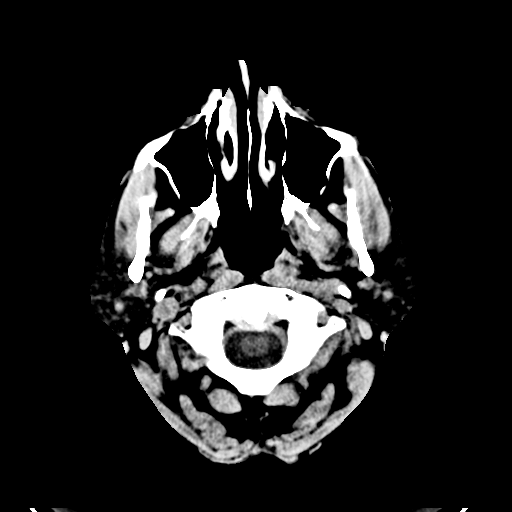
[im 7/36  brain]
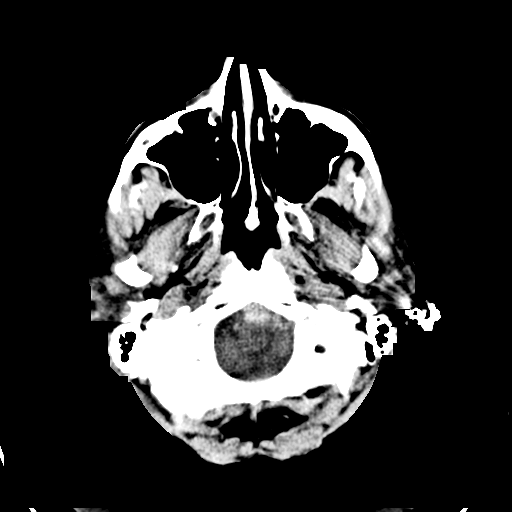
[im 9/36  brain]
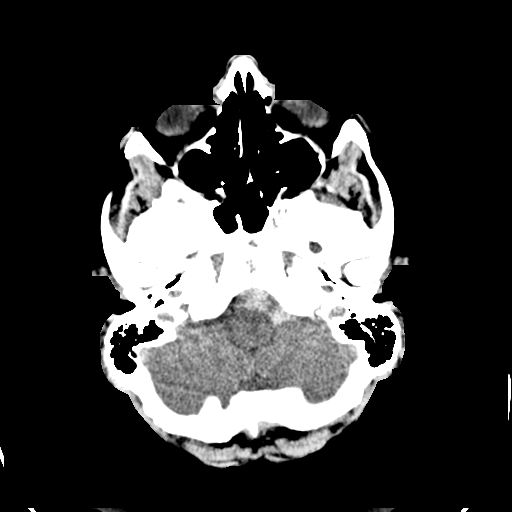
[im 11/36  brain]
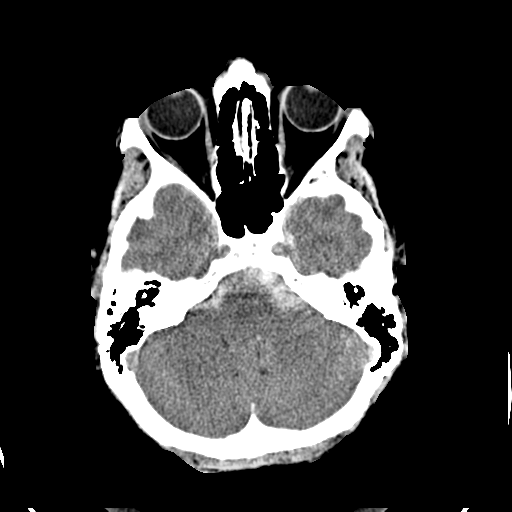
[im 11/36  bone]
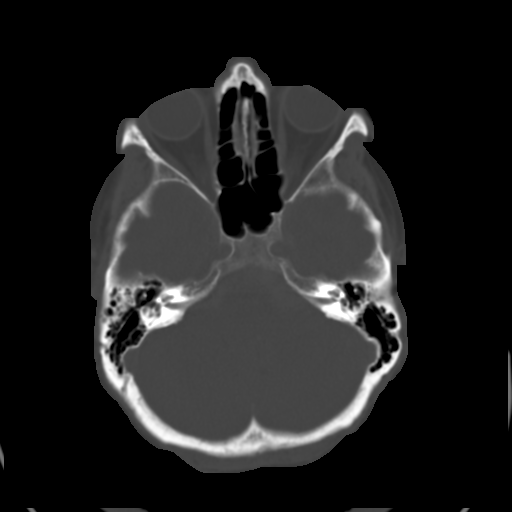
[im 14/36  brain]
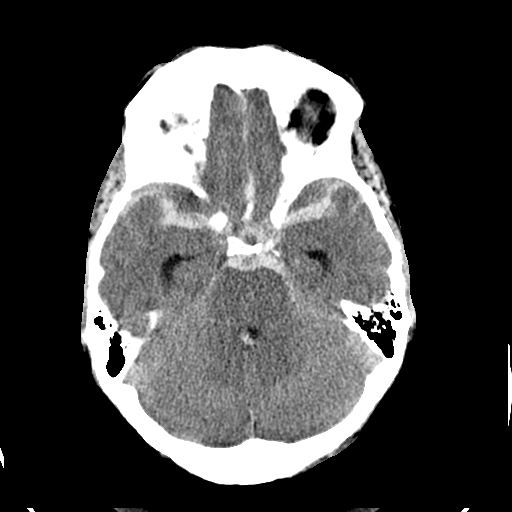
[im 16/36  brain]
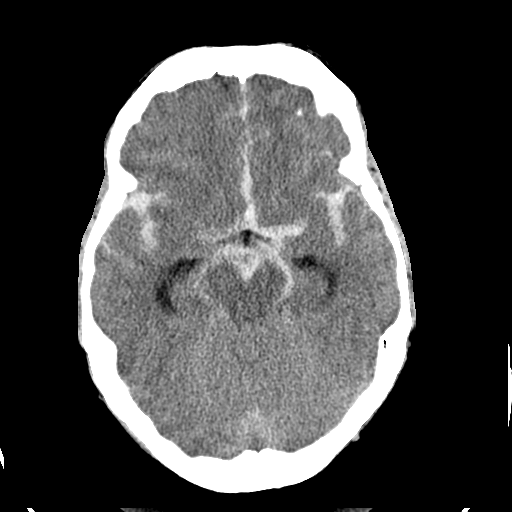
[im 19/36  brain]
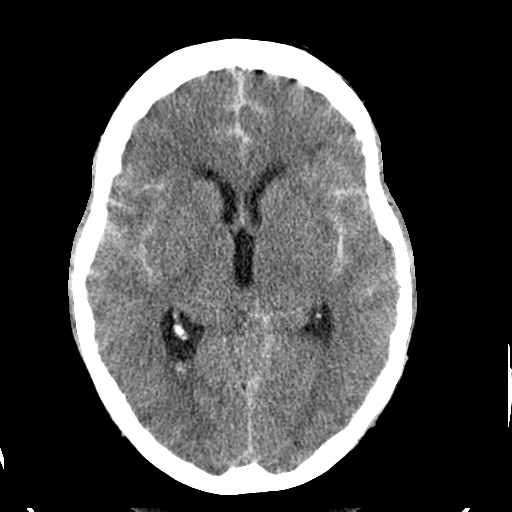
[im 20/36  brain]
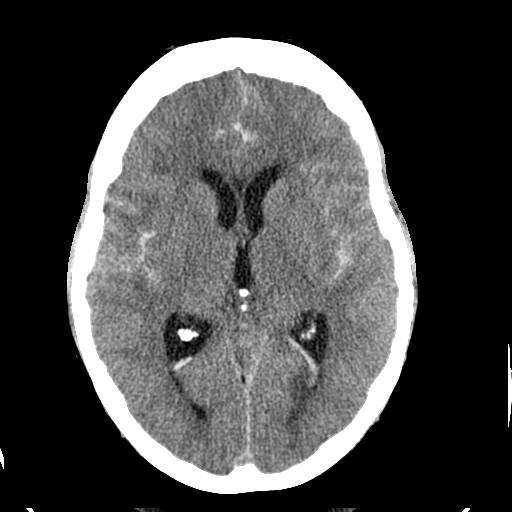
[im 20/36  bone]
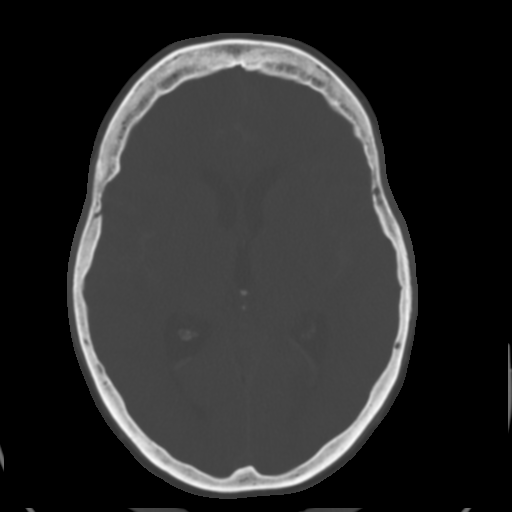
[im 22/36  brain]
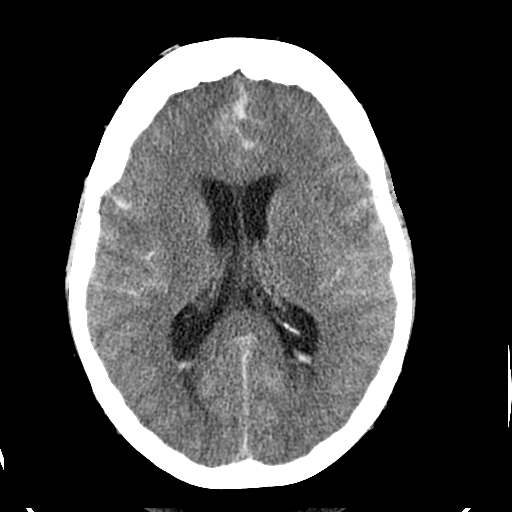
[im 25/36  brain]
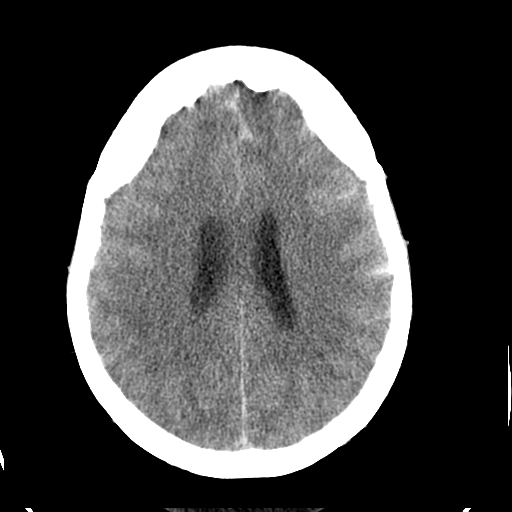
[im 27/36  brain]
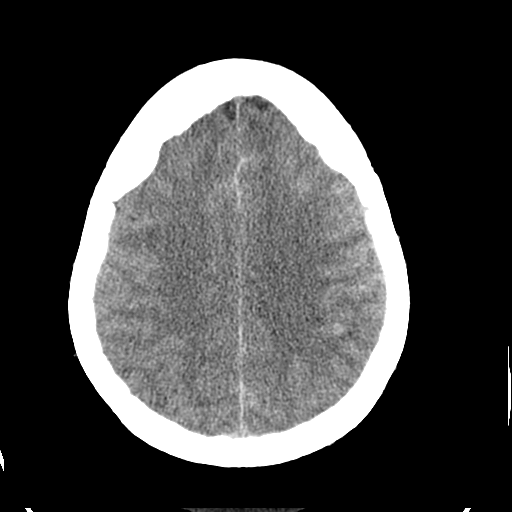
[im 29/36  brain]
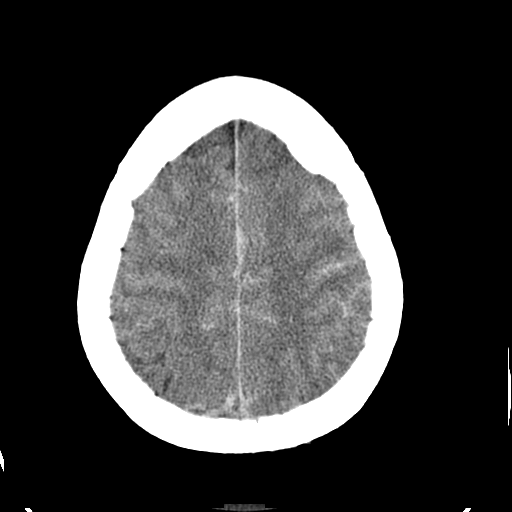
[im 29/36  bone]
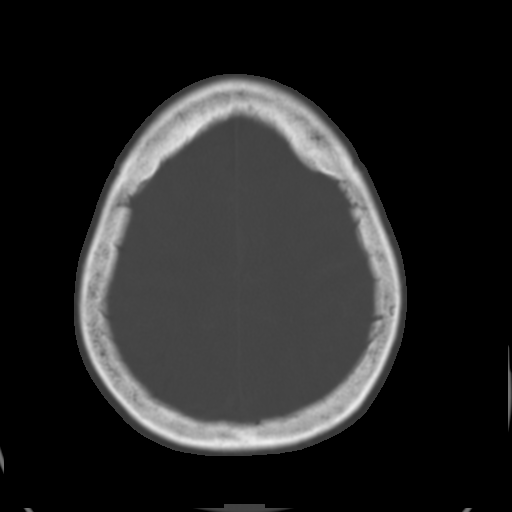
[im 32/36  brain]
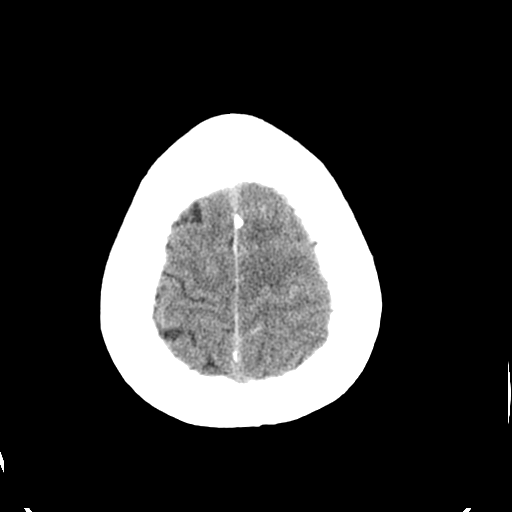
[im 34/36  brain]
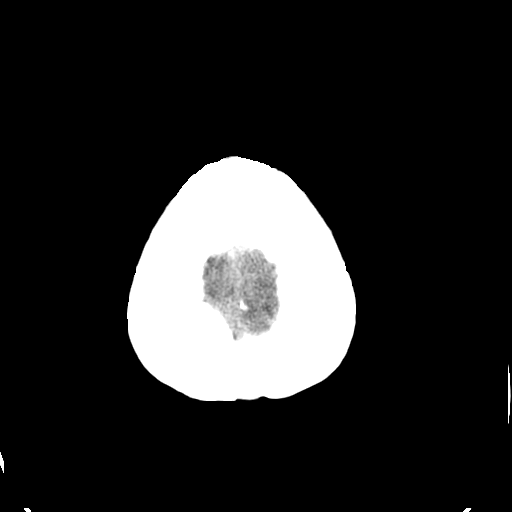

[15 of 30 positions shown; findings below may reference images not displayed]

FINDINGS: There is a large amount of high density subarachnoid
hemorrhage collecting within the suprasellar cistern.  This
hemorrhage appears relatively evenly distributed on the right and
left.  Hemorrhage tracks along the sylvian fissures and along the
quadrigeminal plate cisterns.  There is blood noted within the
fourth ventricle.  Hemorrhage extends anteriorly and superiorly
inthe anterior interhemispheric fissure and is evident above over
the superior cerebral convexities.

The inferior temporal horns are mildly prominent suggesting
potential mild hydrocephalus.  There is small amount of
intraventricular hemorrhage collecting in the posterior  occipital
horns.
IMPRESSION: 1.  Large volume of subarachnoid hemorrhage appears to have
epicenter in the suprasellar cistern most consistent with a
ruptured aneurysm of the anterior circulation.
2.  Hemorrhage extends into the lateral ventricles and fourth
ventricle.
3.  Suggestion of mild hydrocephalus with mild dilatation of the
inferior temporal horns.

Critical test results telephoned to Dr. KINGDOM at the time of
interpretation on [DATE] at [DATE].

## 2009-02-10 ENCOUNTER — Other Ambulatory Visit: Admission: RE | Admit: 2009-02-10 | Discharge: 2009-02-10 | Payer: Self-pay | Admitting: Family Medicine

## 2009-04-06 IMAGING — CR DG CHEST 2V
2 series · 2 of 2 positions shown · non-contrast
Comparison: None

CLINICAL DATA: Headache, blood pressure

CHEST - 2 VIEW

[w chest pa]
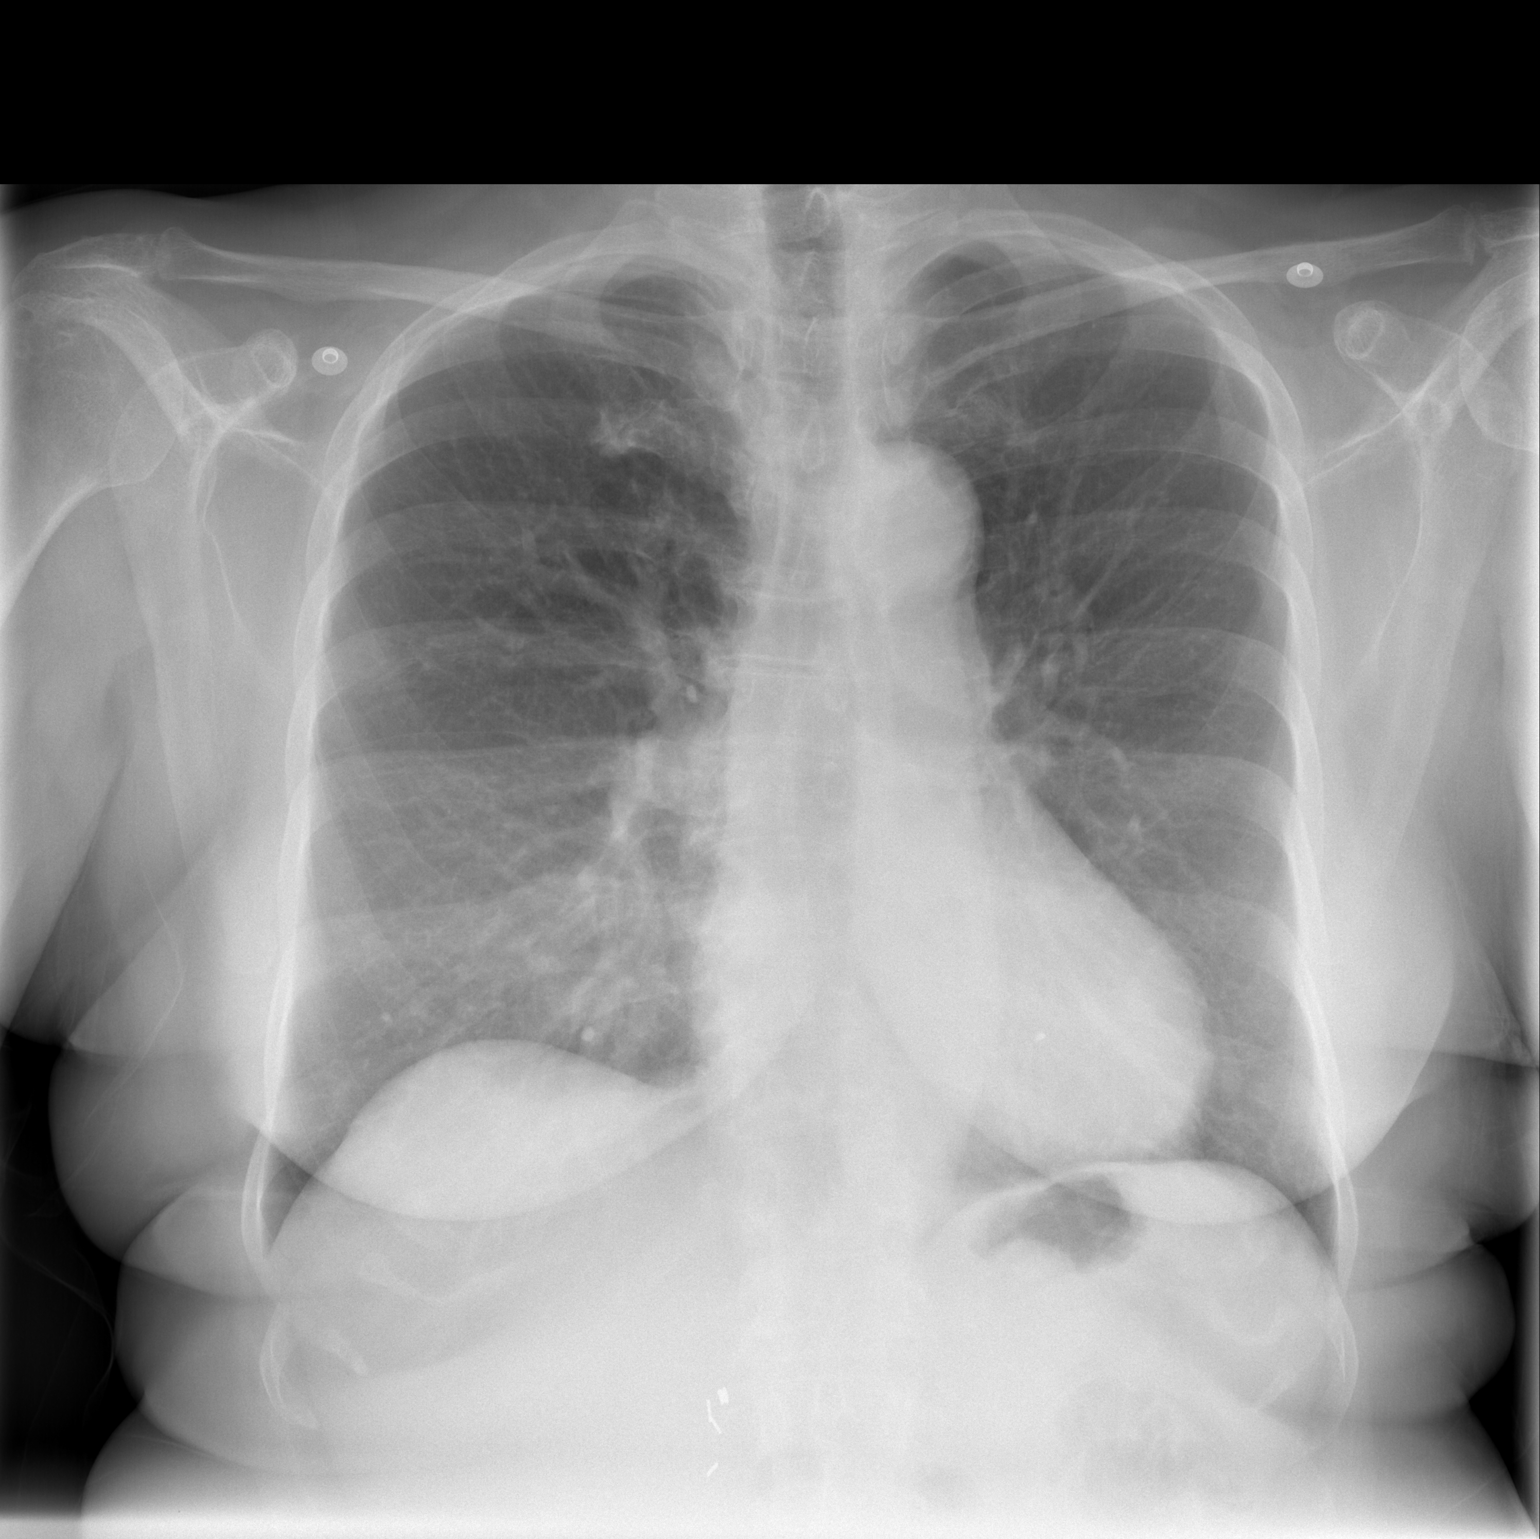

[w chest lat]
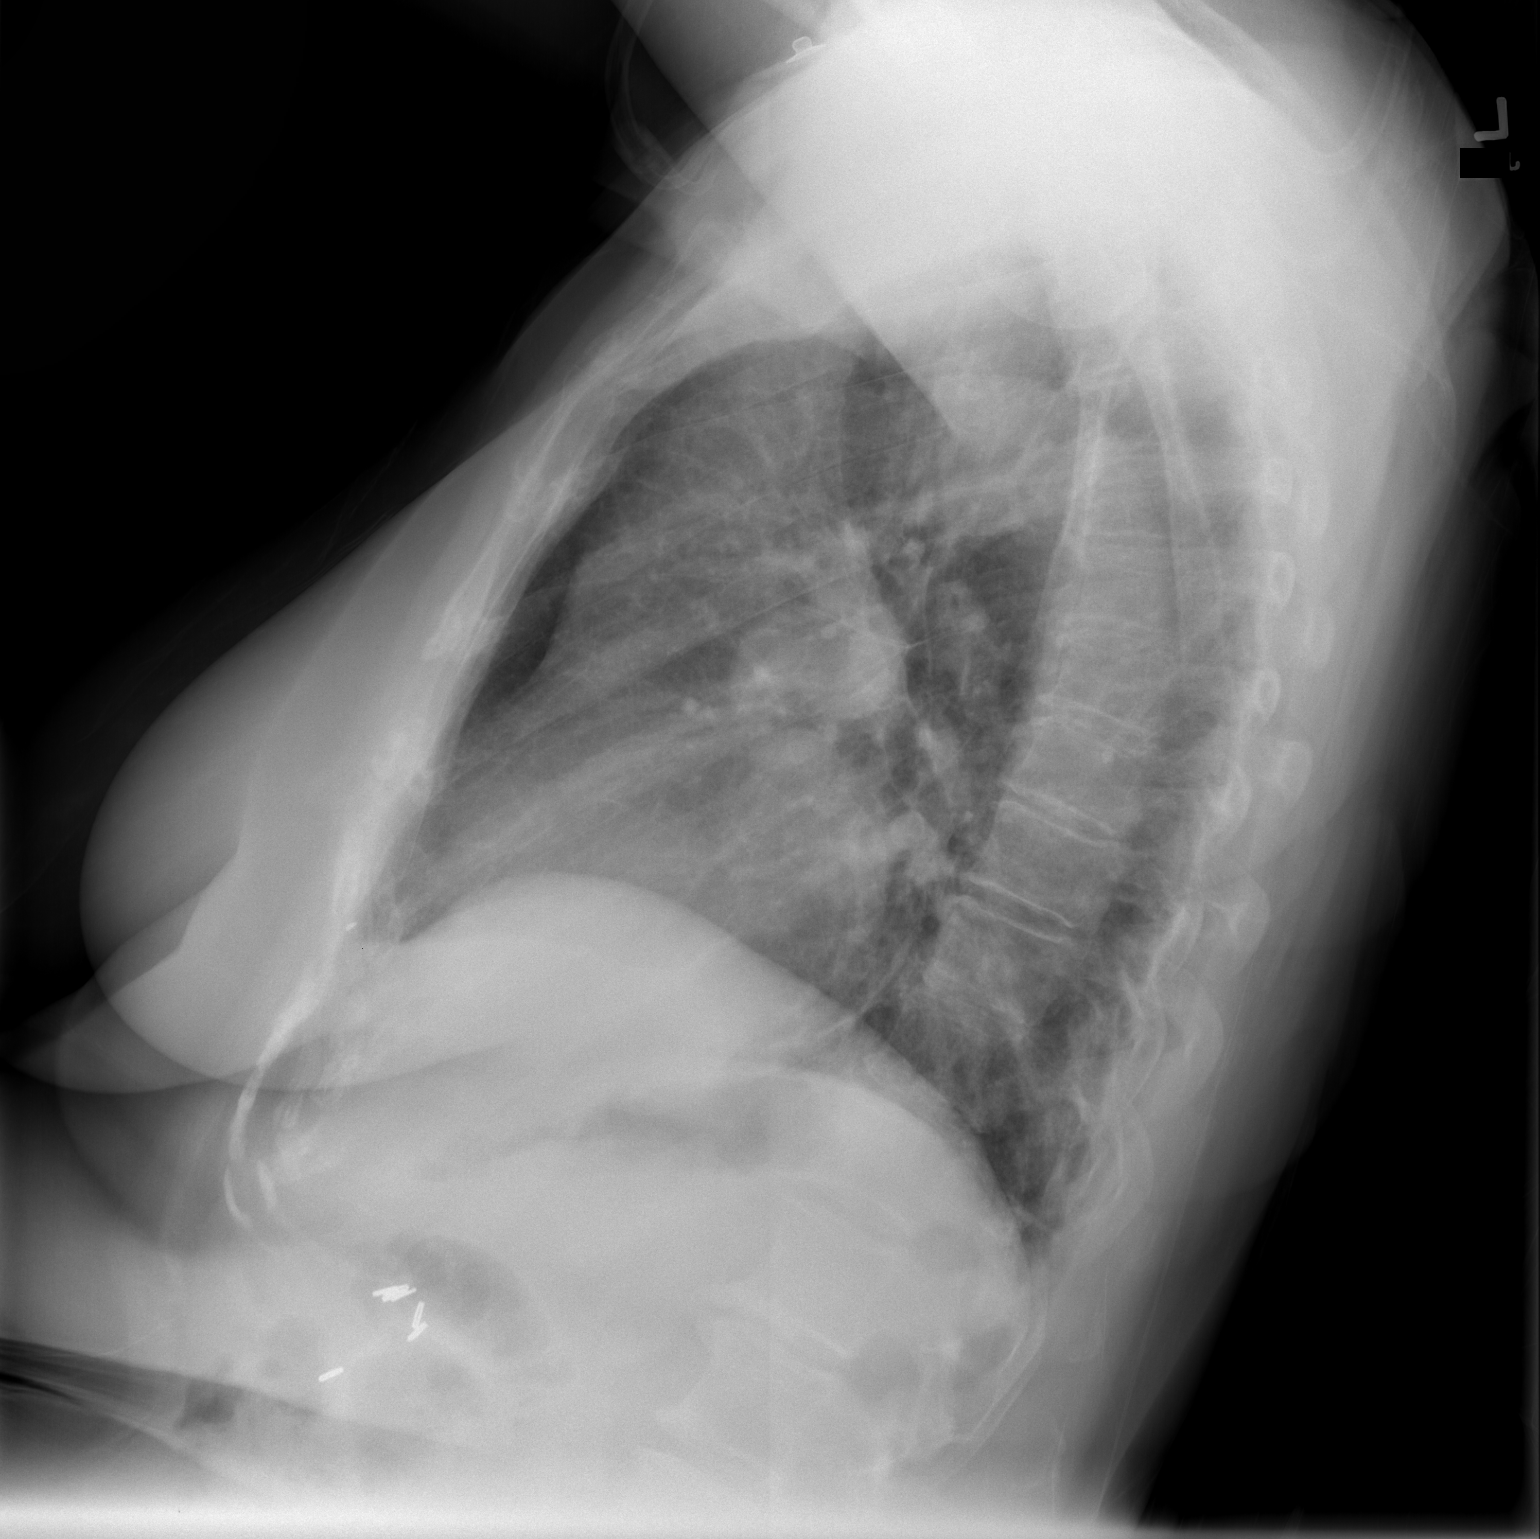

[2 of 2 positions shown; findings below may reference images not displayed]

FINDINGS: Normal mediastinum and cardiac silhouette.  Opacity at
the right cardiophrenic angle Costophrenic angles are clear. No
pulmonary edema.  No evidence of pneumothorax.
IMPRESSION: Opacity at the  right cardiophrenic angle.  Favor subsegmental
atelectasis or vascular crowding over infiltrate.

## 2009-05-18 ENCOUNTER — Encounter: Admission: RE | Admit: 2009-05-18 | Discharge: 2009-05-18 | Payer: Self-pay | Admitting: Family Medicine

## 2009-05-18 IMAGING — MG MM SCREEN MAMMOGRAM BILATERAL
5 series · 5 of 5 positions shown · non-contrast
Comparison: none

DG SCREEN MAMMOGRAM BILATERAL
Bilateral CC and MLO view(s) were taken.

DIGITAL SCREENING MAMMOGRAM WITH CAD:
There are scattered fibroglandular densities.  No masses or malignant type calcifications are 
identified.  Compared with prior studies.
Images were processed with CAD.

[R CC]
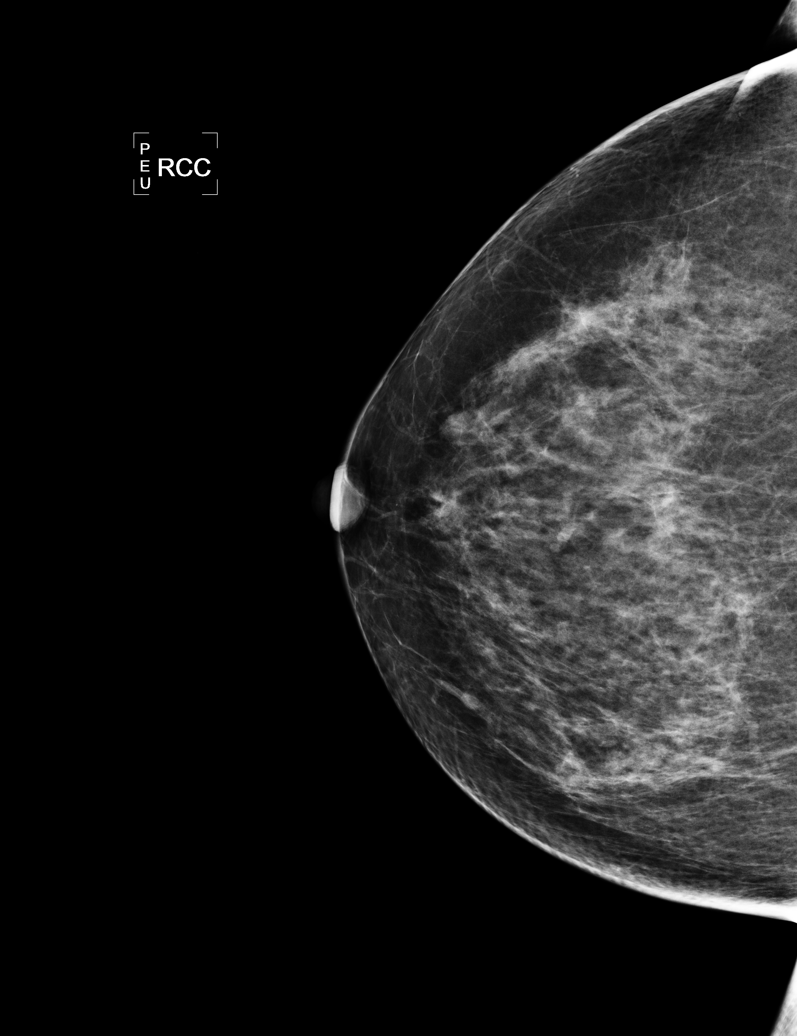

[L CC]
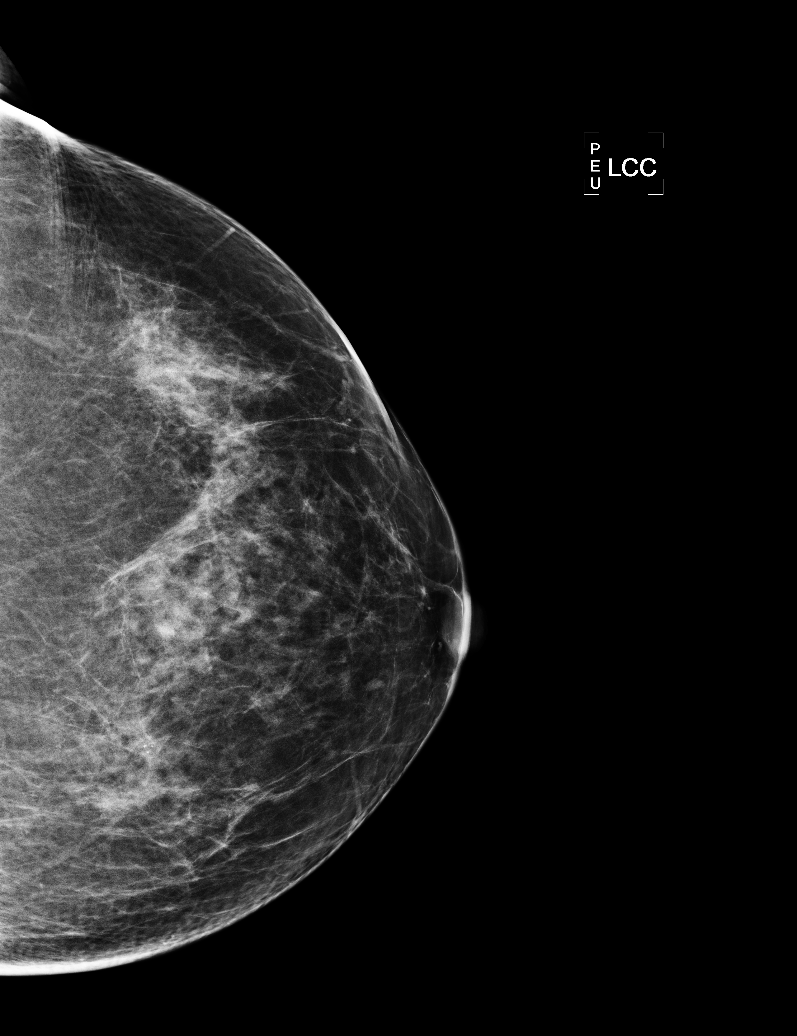

[L MLO]
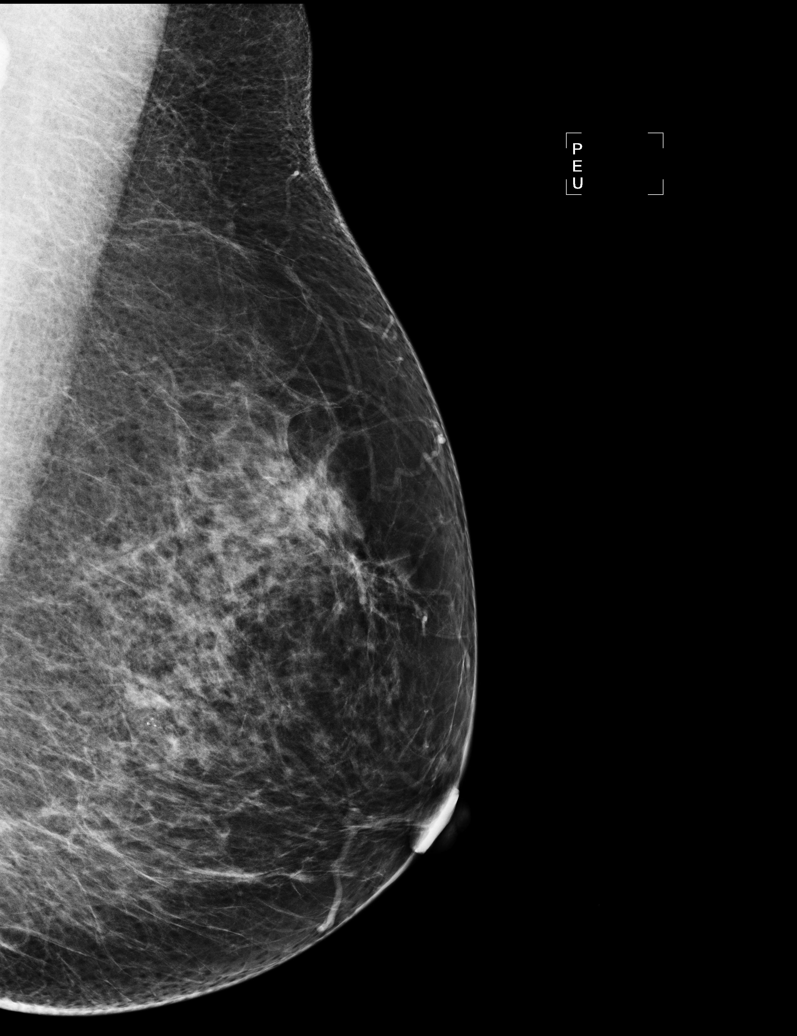

[R MLO (1 of 2)]
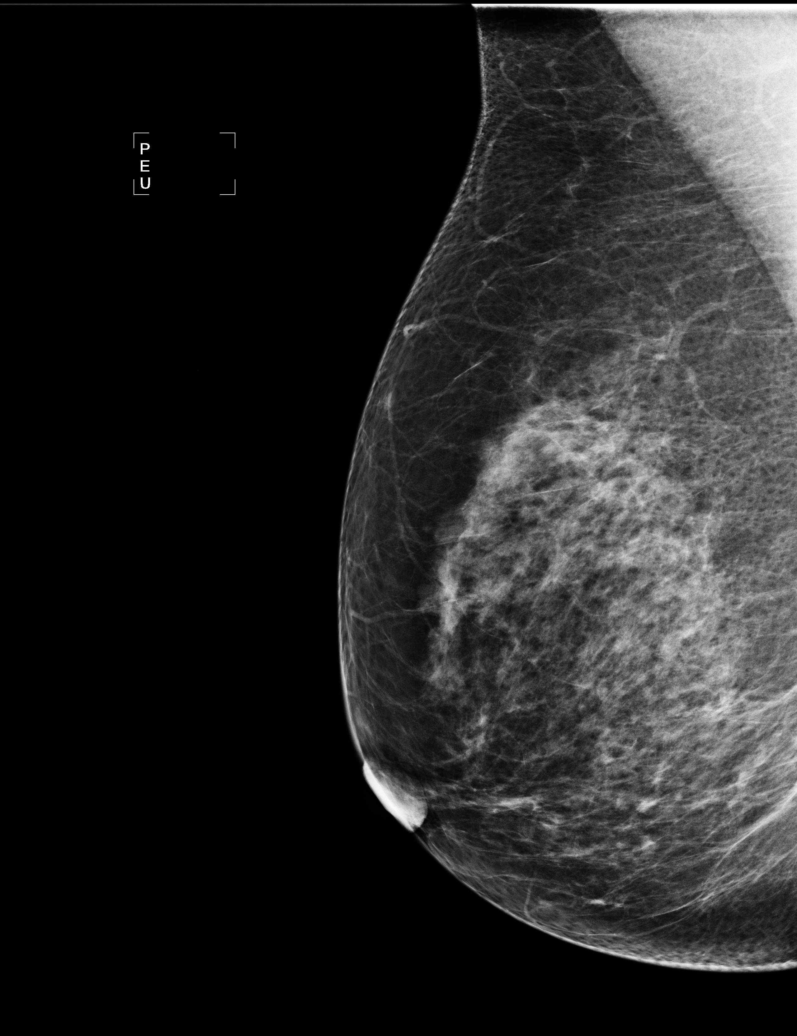

[R MLO (2 of 2)]
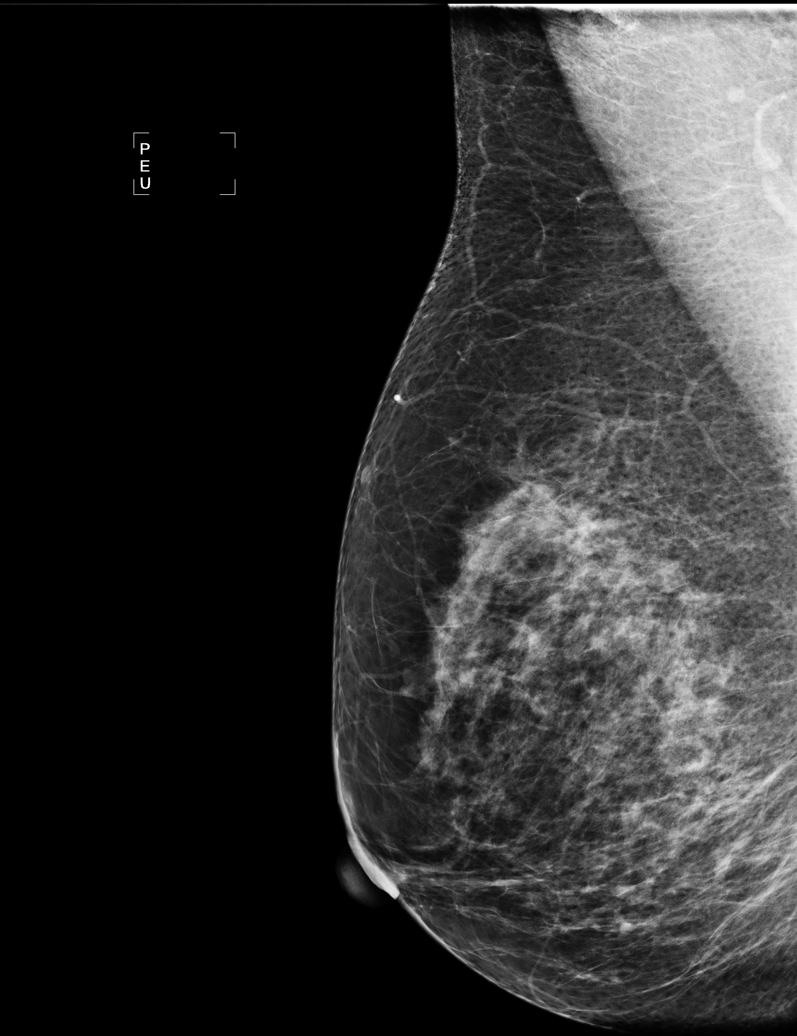

[5 of 5 positions shown; findings below may reference images not displayed]

IMPRESSION: No specific mammographic evidence of malignancy.  Next screening mammogram is recommended in one 
year.

A result letter of this screening mammogram will be mailed directly to the patient.

ASSESSMENT: Negative - BI-RADS 1

Screening mammogram in 1 year.
,

## 2010-05-27 ENCOUNTER — Encounter: Admission: RE | Admit: 2010-05-27 | Discharge: 2010-05-27 | Payer: Self-pay | Admitting: *Deleted

## 2010-05-27 IMAGING — MG MM DIGITAL SCREENING
4 series · 4 of 4 positions shown · non-contrast
Comparison: none

DG SCREEN MAMMOGRAM BILATERAL
Bilateral CC and MLO view(s) were taken.

DIGITAL SCREENING MAMMOGRAM WITH CAD:
There are scattered fibroglandular densities.  No masses or malignant type calcifications are 
identified.  Compared with prior studies.
Images were processed with CAD.

[R CC]
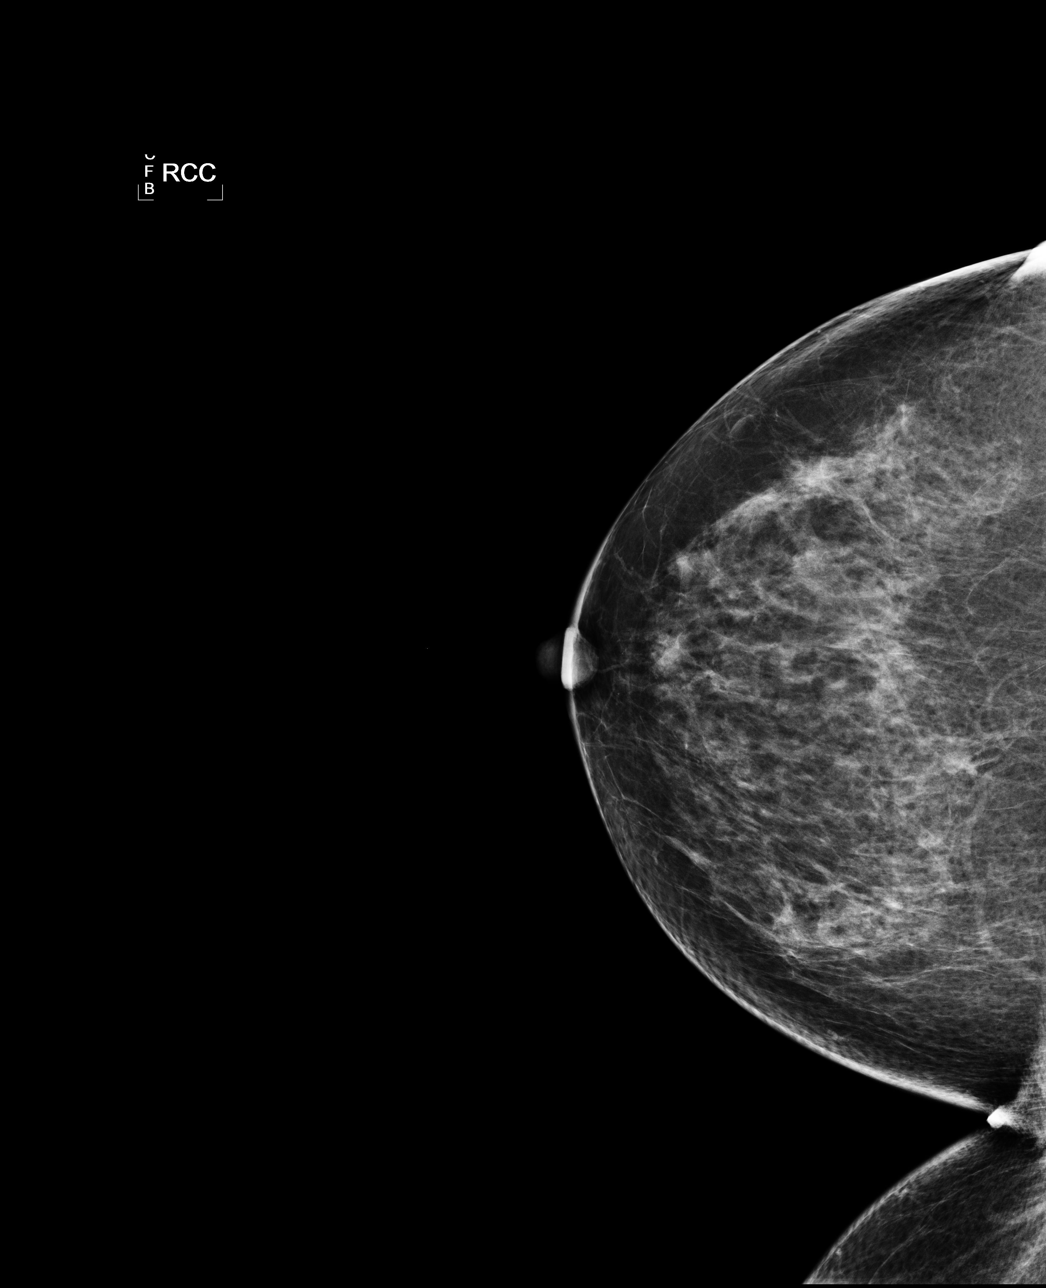

[L CC]
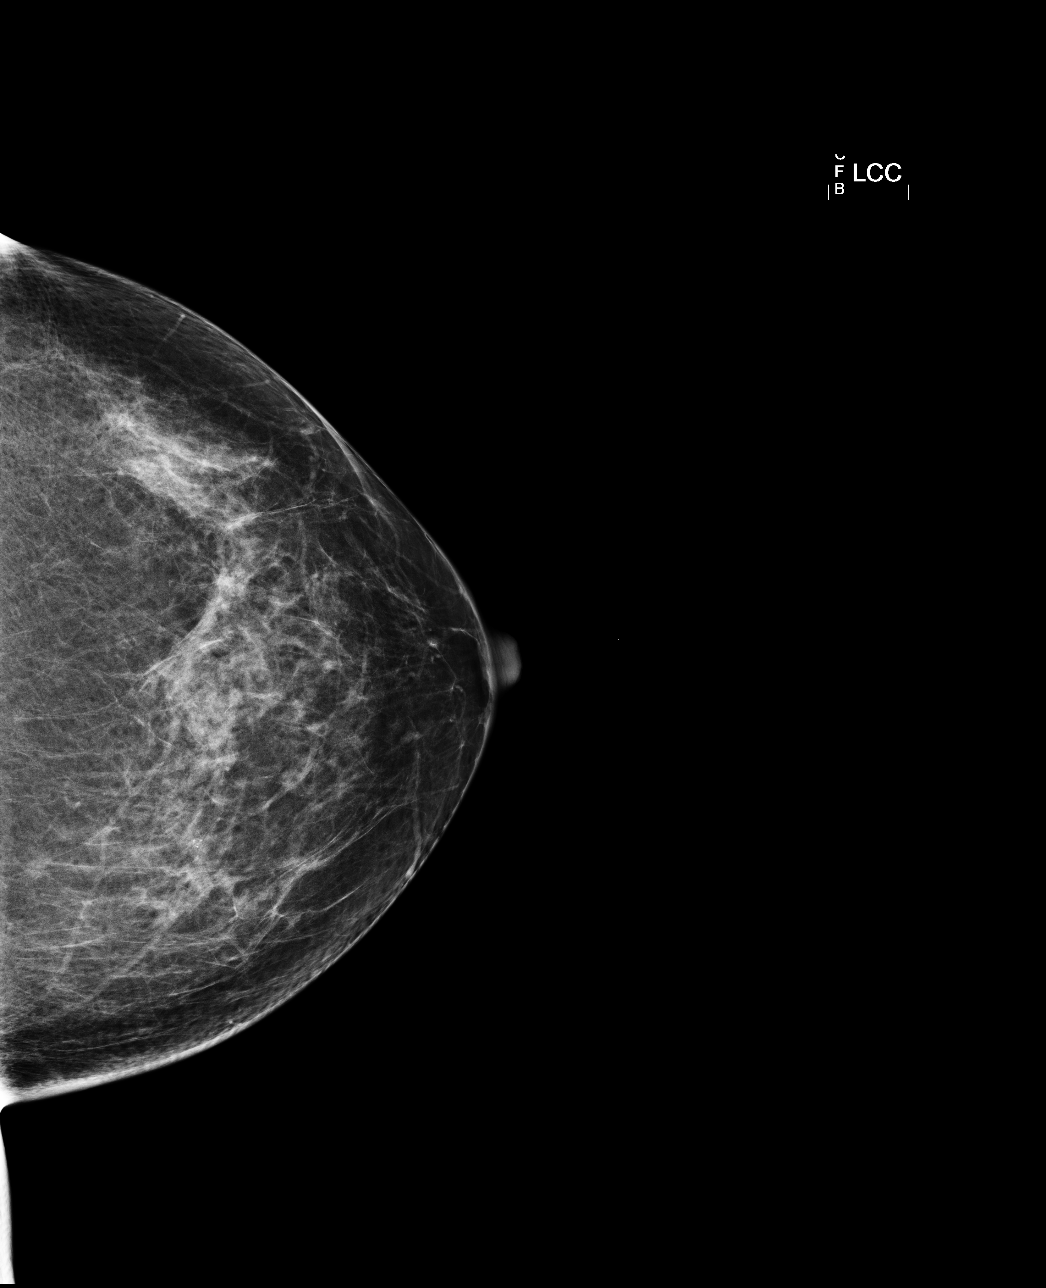

[L MLO]
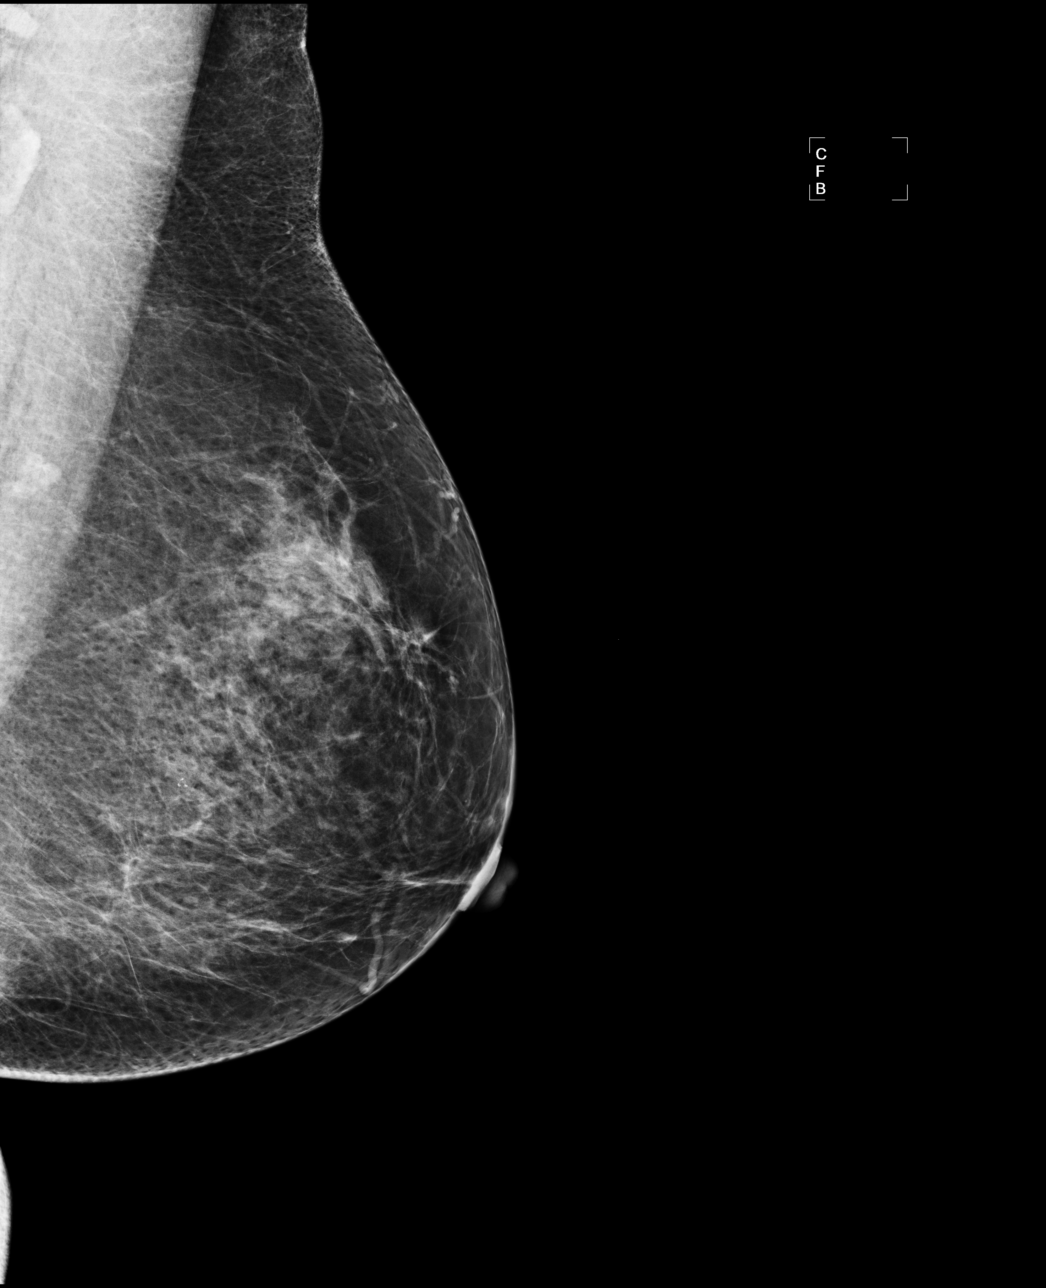

[R MLO]
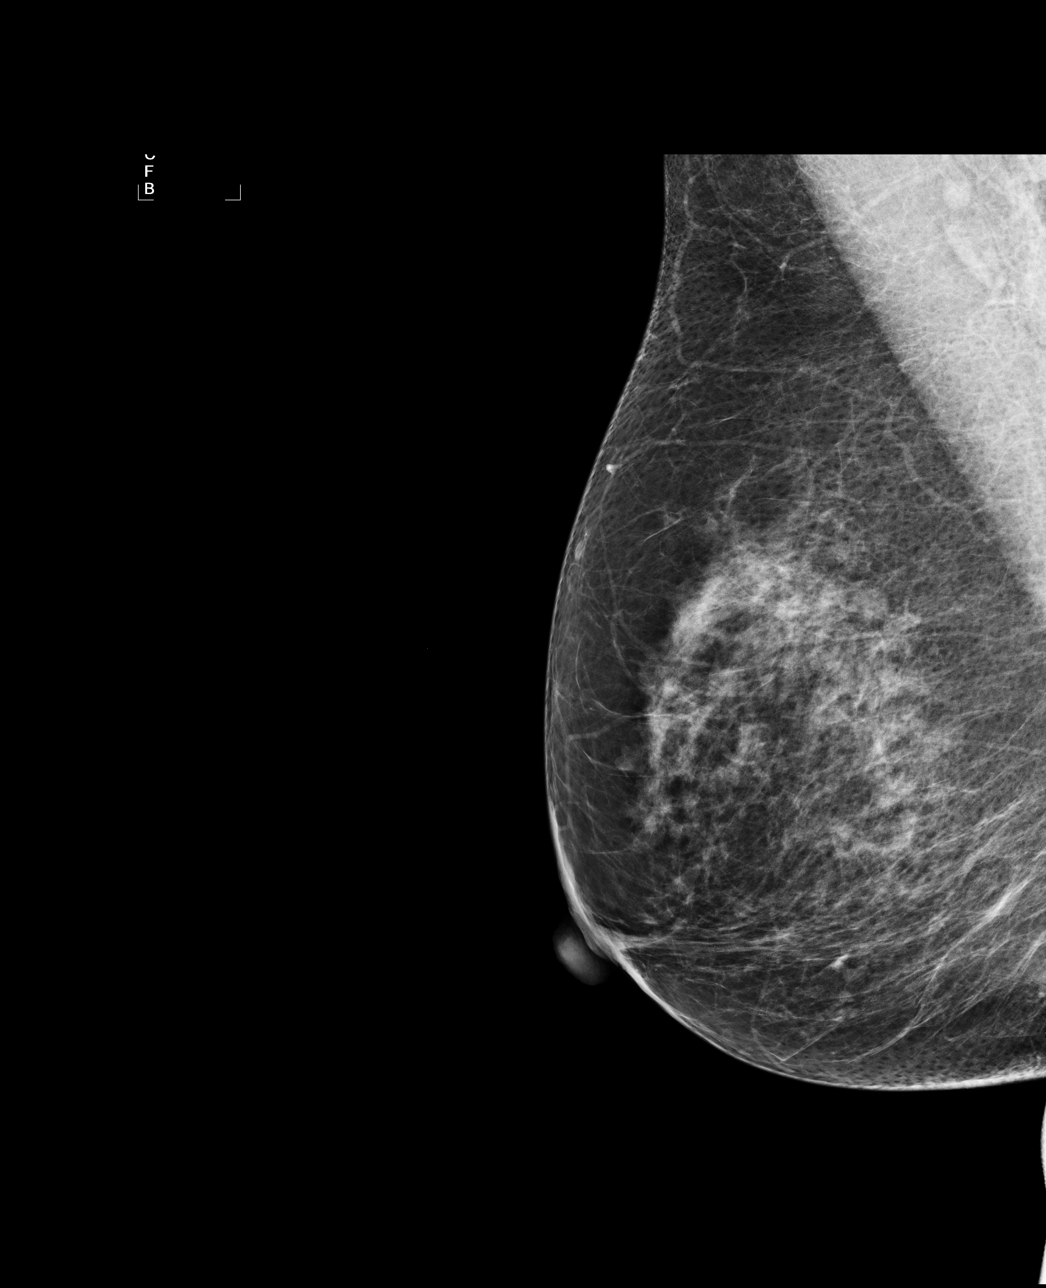

[4 of 4 positions shown; findings below may reference images not displayed]

IMPRESSION: No specific mammographic evidence of malignancy.  Next screening mammogram is recommended in one 
year.

A result letter of this screening mammogram will be mailed directly to the patient.

ASSESSMENT: Negative - BI-RADS 1

Screening mammogram in 1 year.
,

## 2010-11-16 LAB — URINALYSIS, ROUTINE W REFLEX MICROSCOPIC
Bilirubin Urine: NEGATIVE
Nitrite: NEGATIVE
Specific Gravity, Urine: 1.019 (ref 1.005–1.030)
pH: 6 (ref 5.0–8.0)

## 2010-11-16 LAB — COMPREHENSIVE METABOLIC PANEL
Alkaline Phosphatase: 50 U/L (ref 39–117)
BUN: 12 mg/dL (ref 6–23)
CO2: 25 mEq/L (ref 19–32)
Chloride: 105 mEq/L (ref 96–112)
Creatinine, Ser: 0.59 mg/dL (ref 0.4–1.2)
GFR calc non Af Amer: >60 mL/min (ref >60)
Glucose, Bld: 122 mg/dL — ABNORMAL HIGH (ref 70–99)
Potassium: 3.8 mEq/L (ref 3.5–5.1)
Total Bilirubin: 1.4 mg/dL — ABNORMAL HIGH (ref 0.3–1.2)

## 2010-11-16 LAB — URINE MICROSCOPIC-ADD ON

## 2010-11-16 LAB — CBC
HCT: 45.2 % (ref 36.0–46.0)
Hemoglobin: 16 g/dL — ABNORMAL HIGH (ref 12.0–15.0)
MCV: 86.3 fL (ref 78.0–100.0)
Platelets: 219 10*3/uL (ref 150–400)
RBC: 5.24 MIL/uL — ABNORMAL HIGH (ref 3.87–5.11)
WBC: 18.6 10*3/uL — ABNORMAL HIGH (ref 4.0–10.5)

## 2010-11-16 LAB — DIFFERENTIAL
Eosinophils Absolute: 0 10*3/uL (ref 0.0–0.7)
Eosinophils Relative: 0 % (ref 0–5)
Lymphs Abs: 1.2 10*3/uL (ref 0.7–4.0)
Monocytes Absolute: 0.7 10*3/uL (ref 0.1–1.0)
Monocytes Relative: 4 % (ref 3–12)
Neutrophils Relative %: 88 % — ABNORMAL HIGH (ref 43–77)

## 2010-11-16 LAB — PROTIME-INR: INR: 1.2 (ref 0.00–1.49)

## 2010-12-21 NOTE — Consult Note (Signed)
NAMESTEPHANIEANN, Kimberly Gay                 ACCOUNT NO.:  [id number]   MEDICAL RECORD NO.:  [id number]          PATIENT TYPE:  EMS   LOCATION:  ED                           FACILITY:  [facility]   PHYSICIAN:  Tia Alert, MD     DATE OF BIRTH:  [birth year]   DATE OF CONSULTATION:  01/04/2009  DATE OF DISCHARGE:  01/04/2009                                 CONSULTATION   CHIEF COMPLAINT:  Subarachnoid hemorrhage.   HISTORY OF PRESENT ILLNESS:  Ms. Kimberly Gay is a 70 year old female with a  history of hypertension who presents to emergency department with a 36-  hour history of severe headache.  She has had moderate headaches over  the last 2 weeks, but had a sudden onset of severe headache Friday  evening.  She has had no nausea and vomiting, but does notice some  double vision starting today.  No numbness, tingling, weakness.  No  seizure activity.  She came to the emergency department where a CT scan  showed diffuse subarachnoid hemorrhage and neurosurgical evaluation was  requested.   PAST MEDICAL HISTORY:  Hypertension, cholecystectomy, anterior cervical  diskectomy, hysterectomy, cancer of the colon in 1994.   MEDICATIONS:  Atenolol, calcium, multivitamin.   ALLERGIES:  DEMEROL.   SOCIAL HISTORY:  Denies use of tobacco or alcohol products.  She is  married and lives with his spouse.   PHYSICAL EXAMINATION:  VITAL SIGNS:  Temperature 98.2, blood pressure  148/77, pulse 55, respirations 16.  GENERAL:  A pleasant, cooperative female lying in a stretcher.  HEENT:  Normocephalic, atraumatic.  She has a left sixth nerve palsy.  Otherwise, her extraocular movements are intact.  Her pupils are equal,  round, and reactive.  Her visual fields are full to nonformal  confrontation.  Difficult to view her fundus.  Oropharynx is benign.  NECK:  Somewhat rigid.  HEART:  Regular rhythm.  EXTREMITIES:  No obvious deformities.  SKIN:  Fairly unremarkable.  NEUROLOGIC:  She is awake and alert.  She is  pleasant.  She is  interactive.  She has no aphasia.  She has fairly good attention span.  Her fund of knowledge and memory appear to be fairly appropriate.  No  facial asymmetry.  Her tongue protrudes at midline.  She puff out her  cheeks.  She has good shoulder shrug.  Normal facial sensation.  She  does have a left sixth nerve palsy.  Uvula elevates symmetrically.  She  has good strength throughout.  Good muscle tone and bulk.  Sensation is  intact.  Reflexes are okay.  Gait is not tested.  Coordination and  balance are not tested as we do not want to ambulate her at this point.   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  No significant hydrocephalus at this point.   ASSESSMENT AND PLAN:  This is a 70 year old female with a grade 2  subarachnoid hemorrhage.  She has a sixth nerve palsy on the left.  I  spent considerable time with the family discussing the diagnosis, the  typical causes of subarachnoid hemorrhage.  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  We have talked about  clipping versus coiling and the risk and benefits of each procedure.  We  have already spoken to Dr. Kerby Nora here at [facility] who plan on  seeing her as the first thing in the morning for the angiogram and  potential coiling, but after discussion of all these, they have asked  about the prospects of being transferred to [facility] given that they have fellowship-trained  neurovascular surgeons.  I do not think that is an unreasonable request.  I have called and spoken to Dr. Donette Larry who has accepted her  transfer, and therefore we will arrange transfer to [facility] via  EMS at the family's request.      Tia Alert, MD  Electronically Signed     DSJ/MEDQ  D:  01/04/2009  T:  01/04/2009  Job:  [id number]

## 2010-12-24 NOTE — H&P (Signed)
[place]. [facility]  Patient:    Kimberly Gay, MCCAFFERY Visit Number: [id number] MRN: [id number]          Service Type: MED Location: [phone] Attending Physician:  [name] Dictated by:   Hinda Glatter, M.D. Admit Date:  11/21/2001 Discharge Date: 11/22/2001                           History and Physical  DATE OF BIRTH: [birth year]  CHIEF COMPLAINT: Chest pain, shortness of breath, hematuria.  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  The patient initially presented to the clinic approximately one week ago, at which time she had had about a four to six day history of worsening gastroesophageal reflux symptoms. It was described as a burning sensation in her chest that was worse after meals as well as at night.  She also had increasing left shoulder discomfort in the area of a rather large left shoulder lipoma.  In the clinic her EKG was without acute ST-T wave changes and there was thought to be a primary gastroesophageal reflux component, and she was placed on Protonix 40 mg b.i.d. She had some mild tenderness of the left shoulder lipoma area and was referred for surgical evaluation for possible removal since it had increased in size. She presented back to the clinic the next day for follow-up and was really not any better, and repeat EKG showed no acute ST-T wave changes.  Several days later she was seen back in the clinic for follow-up and had a slight improvement in her reflux symptoms, but her shoulder continued to be bothersome.  She saw a physician for surgical evaluation and an MRI was scheduled for next week for further evaluation and possible surgical resection.  Today she presented to the clinic and  states that she is not feeling well, and noted gross hematuria several times this morning with accompanying dysuria as well as some urinary retention symptoms.  She also continues to have this chest discomfort that Protonix b.i.d. has not significantly alleviated her symptoms, and she states that she is short of breath when she gets the chest discomfort.  It is a chest discomfort in the center of her chest and is worsened after meals, but she often has it many hours after meals as well as occasionally at night, and sometimes it lasts for approximately 15 minutes and then goes away.  It is accompanied with shortness of breath many times.  There does not appear to be any exertional component to her symptoms, and she has no prior cardiac history to my knowledge.  After her gross hematuria this morning she had lower abdominal cramping that had resolved prior to her visit at the clinic.  Because of the failure to improve on reflux medicines, with her continuing chest discomfort and shortness of breath, as well as her gross hematuria, she is admitted for further evaluation and treatment.  PAST MEDICAL HISTORY:  1. Hypertension.  2. Colon cancer.  3. Gastroesophageal reflux disease.  4. Seasonal allergies.  5. Plantar fasciitis.  6. Left shoulder lipoma.  CURRENT MEDICATIONS:  1. Hydrochlorothiazide 12.5 mg q.d.  2. Protonix 40 mg b.i.d.  3. Allegra-D q.d. as needed.  4. Atenolol 50 mg q.d.  ALLERGIES: No known drug allergies.  PAST SURGICAL HISTORY:  1. Cholecystectomy.  2. Partial colectomy.  3. Herniated disk surgery.  4. Breast biopsy with benign results.  5. Hysterectomy secondary to fibroids.  FAMILY HISTORY: Father had coronary artery disease and diabetes, and was deceased at age 33 secondary to myocardial infarction.  SOCIAL HISTORY: The patient is married.  She denies tobacco use or alcohol use.  REVIEW OF SYSTEMS: Pertinent for scratchy throat and what is noted in  the History of Present Illness.  Otherwise, Review Of Systems is negative.  PHYSICAL EXAMINATION:  VITAL SIGNS: Blood pressure 160/76, pulse 72, respirations 12.  Afebrile. Weight 159 pounds.  GENERAL APPEARANCE: Well-developed, well-nourished middle-aged female, who is alert and oriented, and in mild discomfort.  In fact, it took the patient several minutes before she could begin to converse during our encounter secondary to discomfort.  HEENT: PERRL.  EOMI.  Sclerae nonicteric.  Tympanic membranes clear.  Nares clear.  Oral cavity, oropharynx clear.  No masses or lesions noted.  NECK: Supple.  No adenopathy.  No carotid bruits.  No thyromegaly.  No increased jugular venous distention.  CARDIOVASCULAR: Regular rate and rhythm.  S1 and S2 without murmurs, rubs, or gallops appreciated.  LUNGS: Clear to auscultation bilaterally with no rales, no wheezes.  ABDOMEN: Normoactive bowel sounds.  Soft.  There was mild tenderness in all four quadrants without rebound or guarding.  No palpable masses.  No hepatosplenomegaly appreciated.  RECTAL: Deferred.  EXTREMITIES: No clubbing, cyanosis, or edema.  Peripheral pulses 2+.  NEUROLOGIC: Grossly intact.  LABORATORY DATA: Urinalysis in clinic showed 3+ blood, positive leukocyte esterase, negative nitrites; 3+ albumin; pH 5.  EKG showed normal sinus rhythm with an occasional PAC, with no acute ST-T wave changes.  IMPRESSION: [age]-year-old female, with history of colon cancer, hypertension, and gastroesophageal reflux disease, admitted with chest discomfort, shortness of breath, hematuria, left shoulder pain, and urinary retention symptoms.  The patient has multiple complaints.  However, in regard to her chest discomfort there appears to be a predominant gastrointestinal component to her symptoms with worsening reflux symptoms.  However, it certainly is not improved significantly on b.i.d. Proton pump inhibitor therapy.  In light of  her left shoulder discomfort, even though that is near a known left lipoma it may be  beneficial to evaluate the patient with a cardiac stress test to ensure that this is not part of her underlying cause.  The patients hematuria is likely secondary to urinary tract infection.  However, less likely would be a possible nephrolithiasis.  The lipoma in the left shoulder appears to be continuing to bother the patient with pain as well as possible compression symptoms, and she is due to have an MRI done as an outpatient next week. However, we will try to do this as an inpatient for further evaluation.  PLAN:  1. The patient will be admitted to a regular bed.  2. Cardiovascular - the patient will be ruled out for myocardial infarction     with serial cardiac enzymes, and will be maintained on her beta-blocker as     well as being placed on 2 liters O2 nasal cannula.     a. Her diuretic will be held at the present time secondary to the patient        receiving IV fluid hydration.     b. It would be beneficial to have the patient undergo a cardiac stress        test to ensure that  this is indeed normal and for reassurance to the        patient.  3. Pulmonary - the patient will have a chest x-ray, PA and lateral, for     further evaluation of her shortness of breath.  Her examination is     unremarkable in regard to her lungs on auscultation and she did appear to     have a hyperventilation component during clinic.  There is very low     suspicion for pulmonary embolus/DVT at the present time.  4. ID - the patient will be placed on Ciprofloxacin 250 mg b.i.d. for what     appears to be a urinary tract infection.  She will have a CBC with     Differential done for further evaluation.  5. Urology - the patient will have a plain abdominal film to evaluate for     possible nephrolithiasis.  Should this be negative and the patient has     continuing symptoms she may benefit from a helical CT scan to  evaluate     for nephrolithiasis.  6. GI - the patient will be maintained on Protonix 40 mg b.i.d.  7. Lipoma - we will attempt to get the MRI of the left shoulder while she is     an inpatient for further evaluation of the lipoma.  8. The patient will receive IV fluid hydration.  9. Further management pending results of the prior-mentioned tests. Dictated by:   Hinda Glatter, M.D. Attending Physician:  [name] DD:  11/21/01 TD:  11/22/01 Job: [id number]

## 2010-12-24 NOTE — Discharge Summary (Signed)
[place]. [facility]  Patient:    Kimberly Gay, Kimberly Gay Visit Number: [id number] MRN: [id number]          Service Type: END Location: ENDO Attending Physician:  Kimberly Gay Dictated by:   Kimberly Gay, M.D. Admit Date:  12/13/2001 Discharge Date: 12/13/2001                             Discharge Summary  ADMISSION DIAGNOSES: 1. Atypical chest pain. 2. Left shoulder pain. 3. Hematuria. 4. Urinary retention. 5. Hypertension.  DISCHARGE DIAGNOSES: 1. Atypical chest pain felt secondary to gastroesophageal reflux disease. 2. Gastroesophageal reflux disease. 3. Hematuria. 4. Urinary retention felt secondary to urinary tract infection. 5. Hypertension. 6. Left shoulder pain with a history of left shoulder mass. Pain secondary    to subacromial, subdeltoid bursitis and supraspinatus tendonitis as well    as concern for anterior labrale tear of left shoulder. Left shoulder mass    secondary to lipoma. 7. Occult spinabifida, S-1 level.  PROCEDURES: 1. Dec 21, 2001 - MRI of the left shoulder. 2. Dec 21, 2001 - Abdominal and chest x-rays.  HISTORY OF PRESENT ILLNESS: Kimberly Gay is a 70 year old white female patient of Kimberly Gay with a history of Dukes B-Gay colon cancer, hypertension, GERD, left shoulder lipoma, admitted for some atypical chest discomfort for which she had been seen repeatedly in the past one week. The patient had been initiated on Protonix 40 mg p.o. b.i.d. as an outpatient and when she presented on the day of admission to Kimberly Gay, there was a concern that her left shoulder discomfort was actually a part of her chest discomfort with thought for cardiac etiology and she was admitted for rule out of MI on November 21, 2001. CPKs and troponin I were well within normal range. The patient also had had some hematuria in the last week, some gross hematuria on the day of admission with some urinary retention symptoms and  dysuria. Urinalysis that was ordered for this hospitalization was never obtained. I do not have records as to whether one was obtained in the office. Finally, there was a concern for her left shoulder discomfort and whether it may be related to the chest discomfort as noted above or a separate issue, particularly with the large, what has been felt to be a lipoma for some time posteriorly. The patient had been seen by orthopedic surgery and an MRI of the shoulder had been recommended but not yet done.  HOSPITAL COURSE:  #1 - ATYPICAL CHEST DISCOMFORT: On history, the morning after admission on November 22, 2001, the patient stated that her chest discomfort had actually improved significantly over the time period that she had started the Protonix. She felt that the left shoulder discomfort was unrelated to her chest discomfort. They did not occur at the same time generally. The patient had also complained of dysphagia with her chest pain previously and that had completely resolved on the Protonix at the time of admission. Her cardiac isoenzymes, troponin I were well within normal limits times two. Sats, EKG also did not show signs of ischemia. At the time of discharge, the patient was recommended to continue on her Protonix and it was also recommended that she follow-up with Kimberly Gay to be set up with GI specialist subsequently for probable EGD, based on her dysphagia.  #2 - GROSS HEMATURIA - Urinalysis just prior to admission on November 21, 2001 as an outpatient showed 1+ leukocytes, negative nitrates, 3+ blood. A culture, I do not believe, was obtained. The patients symptoms of dysuria and hematuria and urinary retention had resolved by the following morning after being started on Cipro 250 mg p.o. b.i.d. Repeat UA that had been ordered was never obtained. A KUB obtained on November 21, 2001 did not show signs of nephro or ureterolithiasis. Occult spinabifida at S1 level, however, was  noted.  #3 - LEFT SHOULDER DISCOMFORT - MRI showed possible tear of anterior labrum as well as supraspinatus tendonitis and associated subacromial subdeltoid bursitis. MRI results were to be forwarded to Kimberly Gay, her orthopedic surgery, regarding this for follow-up. She did have a follow-up appointment already scheduled with him. Also at the time of discharge, the patient complained that her allergies were bothering her and Allegra D was also ordered.  DISPOSITION: The patient was discharged in stable condition on November 22, 2001.  DISCHARGE MEDICATIONS: 1. Cipro 250 mg p.o. b.i.d. for four more days. 2. Protonix 40 mg twice a day. 3. Atenolol 50 mg p.o. daily for hypertension. 4. Allegra D one p.o. twice daily p.r.n.  SPECIAL INSTRUCTIONS: She is to avoid caffeine, chocolate, onions, tomatoes, anti-inflammatory medications. She is to call for increased chest pain.  FOLLOW-UP: With Kimberly Gay in two weeks. To call for an appointment. Dictated by:   Kimberly Gay, M.D. Attending Physician:  Kimberly Gay DD:  01/09/02 TD:  01/11/02 Job: [phone] [id number]

## 2010-12-24 NOTE — Op Note (Signed)
[facility]  Patient:    Kimberly Gay, Kimberly Gay Visit Number: [id number] MRN: [id number]          Service Type: DSU Location: DAY Attending Physician:  Marlowe Kays Page Dictated by:   Illene Labrador. Aplington, M.D. Proc. Date: 12/19/01 Admit Date:  12/19/2001 Discharge Date: 12/19/2001                             Operative Report  PREOPERATIVE DIAGNOSIS:  Suspected lipoma posterior left shoulder.  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  OPERATION PERFORMED:  Excision of suspected lipoma, posterior left shoulder.  SURGEON:  Illene Labrador. Aplington, M.D.  ASSISTANT:  Della Goo, P.A.-C  ANESTHESIA:  General.  PATHOLOGY AND JUSTIFICATION FOR PROCEDURE:  She has had a painful growing mass on the posterior left shoulder for about a year with some numbness occasionally down the posterior triceps area. I have obtained an MRI of this area and it was felt that is mostly like a lipoma and not a liposarcoma. She is here today for excision because it is painful and growing and is also quite large being about 9 cm in estimated length.  DESCRIPTION OF PROCEDURE:  Satisfactory general anesthesia, placed prone on rolls. The left posterior shoulder and arm were prepped with Duraprep and draped in a sterile field. The incision was marked out before applying an [place] and also infiltrated with 0.5% Marcaine with adrenaline. Collier Flowers was employed then followed by a four quadrant drape. An oblique incision was made going along transversely over the back to the upper arm area. Once through the skin and subcutaneous tissue what appeared to be a lipoma was demonstrated. I was able to dissect out some of it from the surrounding tissues finger dissection. Several small feeder vessels were identified and cauterized with the superior portion. A deep sensory nerve was identified and protected and dissected off the lipoma and presumably this is one of  the causing of the numbness she is having in the posterior arm. The lipoma was able to be excised in toto and sent to pathology for identification but it was not infiltrating and again appeared to be a benign lesion. The wound was irrigated with sterile saline and the soft tissue was infiltrated once again with 0.5% Marcaine with adrenaline. There did not appear to be a lot of dead space so I did not feel that a drain was needed. The deep subcutaneous tissue was closed with interrupted 2-0 Vicryl, superficial with interrupted 4-0 Vicryl and skin with Steri-Strips. A dry sterile and sling were applied. She tolerated the procedure well and at the time of this dictation was on her way to the recovery room in satisfactory condition with no known complications. Dictated by:   Illene Labrador. Aplington, M.D. Attending Physician:  Joaquin Courts DD:  12/19/01 TD:  12/20/01 Job: [id number]

## 2010-12-24 NOTE — Procedures (Signed)
[place]. [facility]  Patient:    Kimberly Gay, Kimberly Gay Visit Number: [id number] MRN: [id number]          Service Type: DSU Location: DAY Attending Physician:  Marlowe Kays Page Dictated by:   Verlin Grills, M.D. Proc. Date: 12/13/01 Admit Date:  12/19/2001 Discharge Date: 12/19/2001   CC:         Kimberly Gay, M.D.   Procedure Report  REFERRING PHYSICIAN:  Julieanne Gay, M.D.  PROCEDURE:  Esophagogastroduodenoscopy.  PROCEDURE INDICATION:  Ms. Kimberly Gay is a 70 year old female born 05/24/1941.  Ms. Kimberly Gay has chronic gastroesophageal reflux manifested by heartburn. She reports new-onset solid food dysphagia; she feels as though her food sticks in the midesophagus.  She was recently started on Protonix.  She has no difficulty swallowing liquids.  I discussed with Ms. Kimberly Gay the complications associated with esophagogastroduodenoscopy and Savary esophageal dilation, including intestinal bleeding and intestinal perforation.  Ms. Kimberly Gay has signed the operative permit.  MEDICATION ALLERGIES:  DEMEROL.  CHRONIC MEDICATIONS:  Atenolol, Protonix, Allegra, Rhinocort, Levsin.  PAST MEDICAL HISTORY:  Dukes B2 colon cancer removed approximately eight years ago.  Ms. Kimberly Gay tells me she has only seven inches of colon remaining. Hypertension.  Gastroesophageal reflux.  Seasonal allergies.  Postmenopausal. Plantar fasciitis.  PAST SURGICAL HISTORY:  Cholecystectomy.  Segmental colectomy.  Breast biopsy for benign breast mass.  Lumbar disk surgery.  Hysterectomy.  ENDOSCOPIST:  Verlin Grills, M.D.  PREMEDICATION:  Versed 7.5 mg, fentanyl 50 mcg.  ENDOSCOPE:  Olympus gastroscope.  DESCRIPTION OF PROCEDURE:  After obtaining informed consent, Ms. Kimberly Gay was placed in the left lateral decubitus position.  I administered intravenous fentanyl and intravenous Versed to achieve conscious sedation for the procedure.  The patients blood  pressure, oxygen saturation, and cardiac rhythm were monitored throughout the procedure and documented in the medical record.  The Olympus gastroscope was passed through the posterior hypopharynx into the proximal esophagus without difficulty.  The hypopharynx, larynx, and vocal cords appeared normal.  Esophagoscopy:  The proximal, mid-, and lower segments of the esophagus appeared completely normal.  Endoscopically there is no evidence for the presence of Barretts esophagus, esophageal mucosal scarring, esophageal stricture formation, erosive esophagitis, esophageal ulceration, or esophageal obstruction.  Gastroscopy:  There is a small hiatal hernia.  Retroflex view of the gastric cardia and fundus is normal.  The diaphragmatic hiatus is quite patulous.  The gastric body, antrum, and pylorus appear normal.  Duodenoscopy:  The duodenal bulb and descending duodenum appear normal.  ASSESSMENT:  Gastroesophageal reflux associated with a small hiatal hernia. There is no endoscopic evidence for the presence of Barretts esophagus, esophageal mucosal scarring, erosive esophagitis, esophageal ulceration, esophageal stricture formation, or esophageal obstruction.  Esophageal dilation was not performed.  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 Dictated by:   Verlin Grills, M.D. Attending Physician:  Joaquin Courts DD:  12/13/01 TD:  12/15/01 Job: [id number]

## 2010-12-24 NOTE — Op Note (Signed)
   NAMENAELA, NODAL                             ACCOUNT NO.:  [id number]   MEDICAL RECORD NO.:  [id number]                   PATIENT TYPE:   LOCATION:                                       FACILITY:   PHYSICIAN:  Danise Edge, M.D.                DATE OF BIRTH:  1940/09/15   DATE OF PROCEDURE:  06/12/2002  DATE OF DISCHARGE:                                 OPERATIVE REPORT   PROCEDURE:  Flexible proctosigmoidoscopy with snare polypectomy.   INDICATIONS:  The patient is a 70 year old female born 04/05/1941.  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  Her ileocolonic anastomosis is noted at 25 cm from the anal verge.  The  patient is undergoing screening proctocolonoscopy with polypectomy to  prevent colon cancer.   ENDOSCOPIST:  Danise Edge, M.D.   PREMEDICATION:  Versed 5 mg, Demerol 50 mg.   ENDOSCOPIST:  Olympus pediatric colonoscope.   DESCRIPTION OF PROCEDURE:  After obtaining informed consent, the patient was  placed in the left lateral decubitus position.  I administered intravenous  Demerol and intravenous Versed to achieve conscious sedation for the  procedure.  The patient's blood pressure, oxygen saturation, and cardiac  rhythm were monitored throughout the procedure and documented in the medical  record.   Anal inspection was normal.  Digital rectal exam was normal.  The Olympus  pediatric video colonoscope was introduced into the rectum and at 25 cm from  the anal verge, her ileo-left colonic surgical anastomosis is noted.  At 20  cm from the anal verge, two 3 mm sessile polyps were lifted with submucosal  saline injection and removed with the electrocautery snare.  The remainder  of the sigmoid colon and rectum appeared otherwise normal.   RECOMMENDATIONS:  Repeat proctosigmoidoscopy in approximately three years  pending pathology from the removed polyps.                                               Danise Edge, M.D.    MJ/MEDQ  D:  06/12/2002  T:  06/12/2002  Job:  [id number]   cc:   Ike Bene, M.D.  [address]  [place]  [place] [address]  Fax: [phone]

## 2011-06-17 ENCOUNTER — Other Ambulatory Visit: Payer: Self-pay | Admitting: Family Medicine

## 2011-06-17 DIAGNOSIS — Z1231 Encounter for screening mammogram for malignant neoplasm of breast: Secondary | ICD-10-CM

## 2011-07-21 ENCOUNTER — Ambulatory Visit
Admission: RE | Admit: 2011-07-21 | Discharge: 2011-07-21 | Disposition: A | Discharge status: Home / Self Care | Payer: Medicare Other | Source: Ambulatory Visit | Attending: Family Medicine | Admitting: Family Medicine

## 2011-07-21 DIAGNOSIS — Z1231 Encounter for screening mammogram for malignant neoplasm of breast: Secondary | ICD-10-CM

## 2011-07-21 IMAGING — MG MM DIGITAL SCREENING BILAT
5 series · 5 of 5 positions shown · non-contrast
Comparison: none

DG SCREEN MAMMOGRAM BILATERAL
Bilateral CC and MLO view(s) were taken.

DIGITAL SCREENING MAMMOGRAM WITH CAD:
There are scattered fibroglandular densities.  A possible mass is noted in the right breast.  Spot 
compression views and possibly sonography are recommended for further evaluation.  In the left 
breast, no masses or malignant type calcifications are identified.  Compared with prior studies.
Images were processed with CAD.

[R CC]
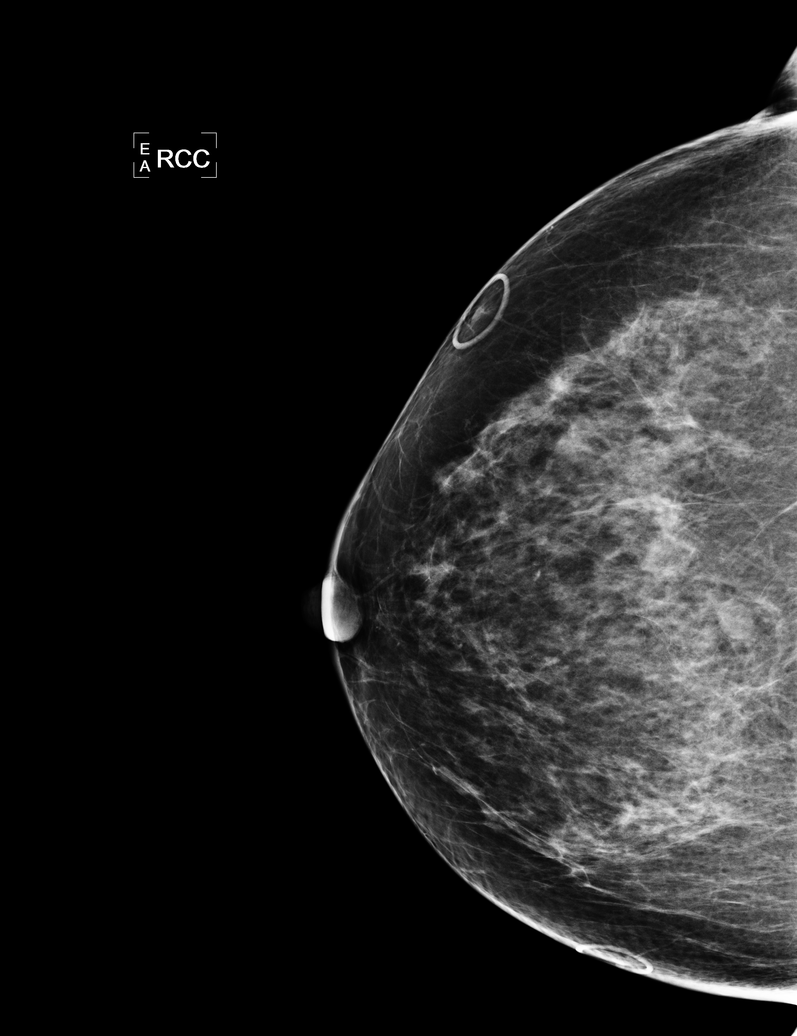

[L CC]
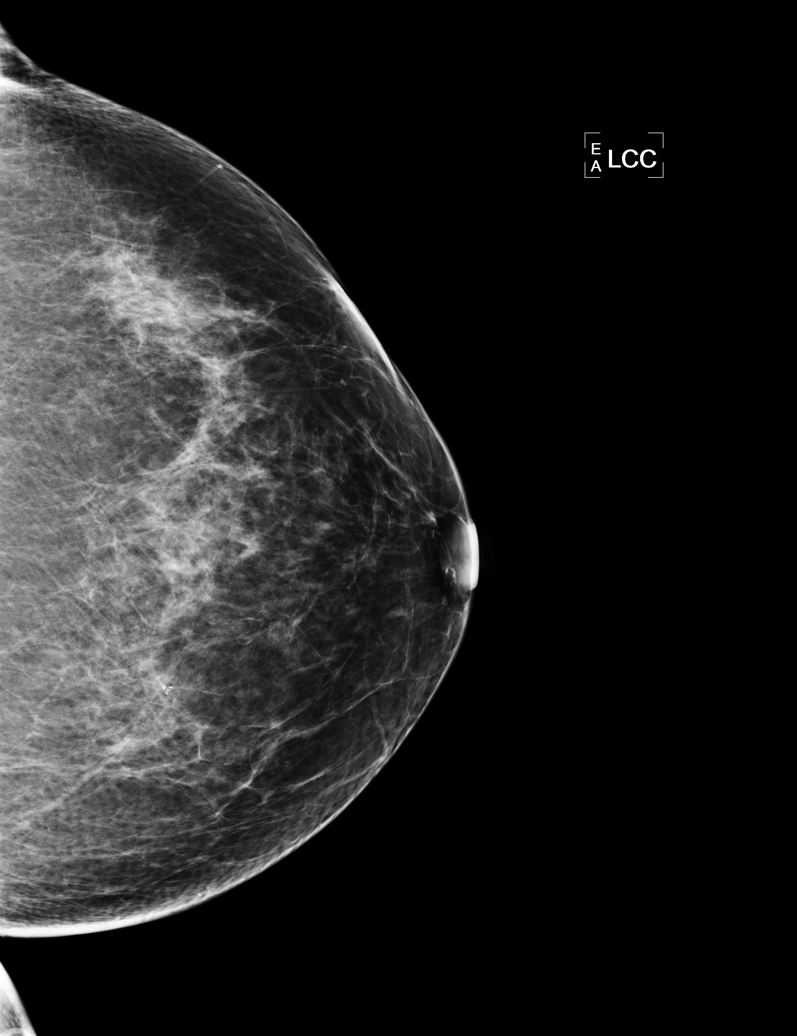

[L MLO]
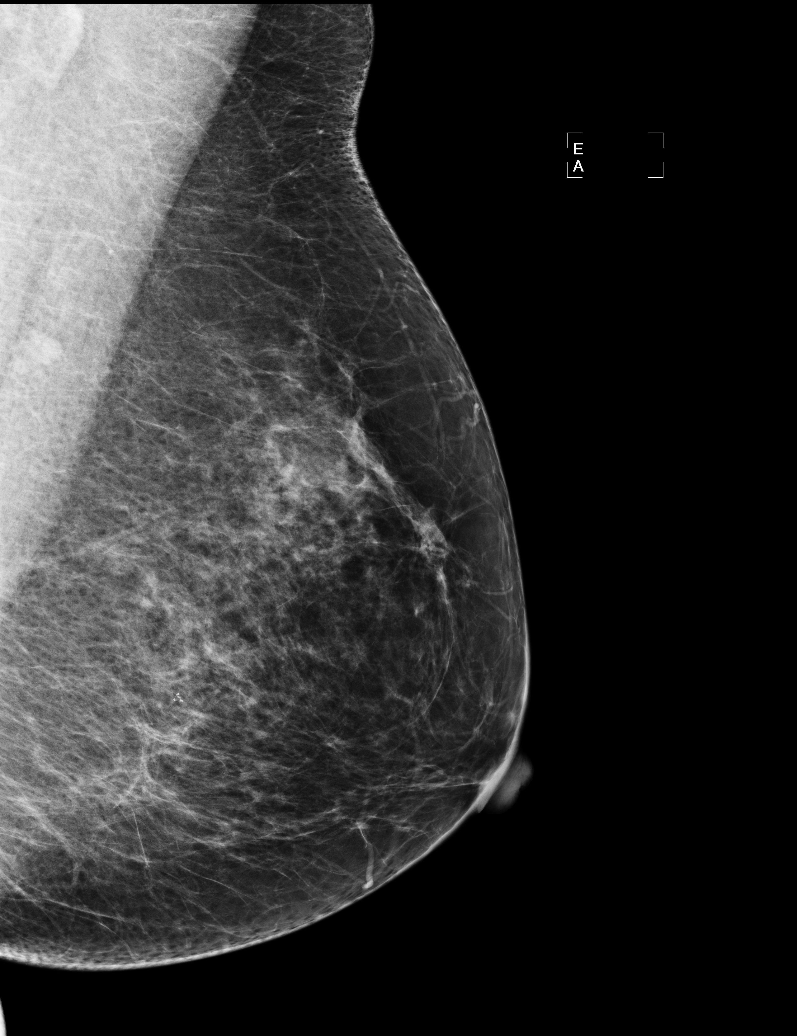

[R MLO (1 of 2)]
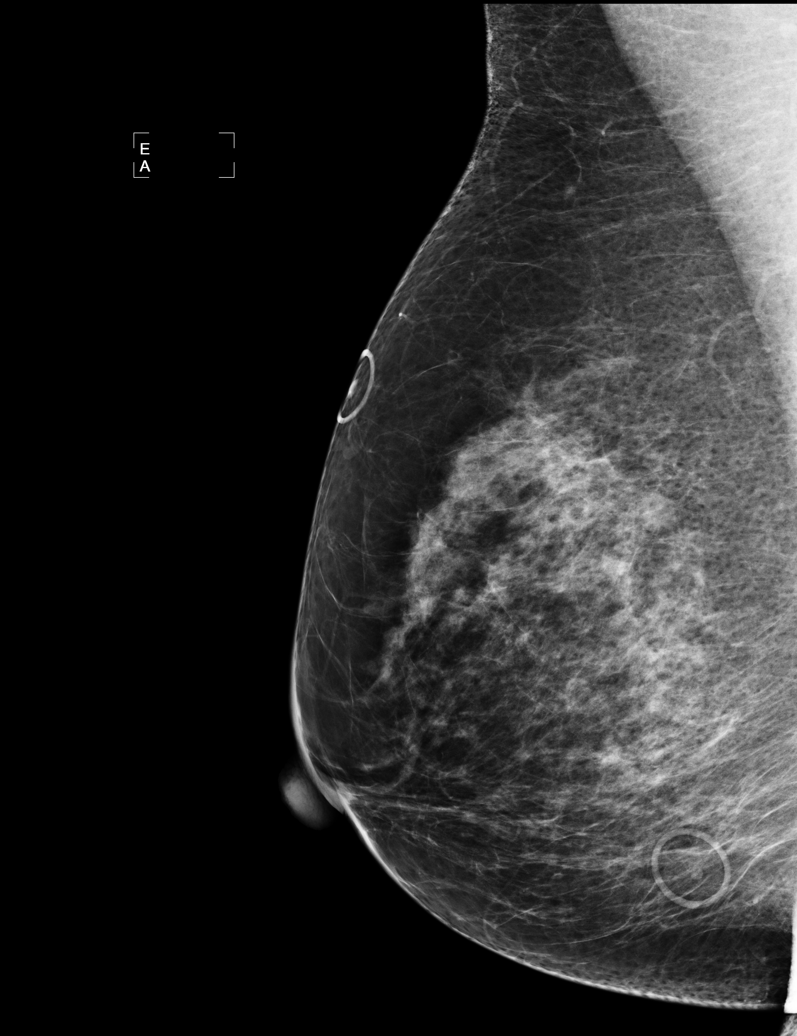

[R MLO (2 of 2)]
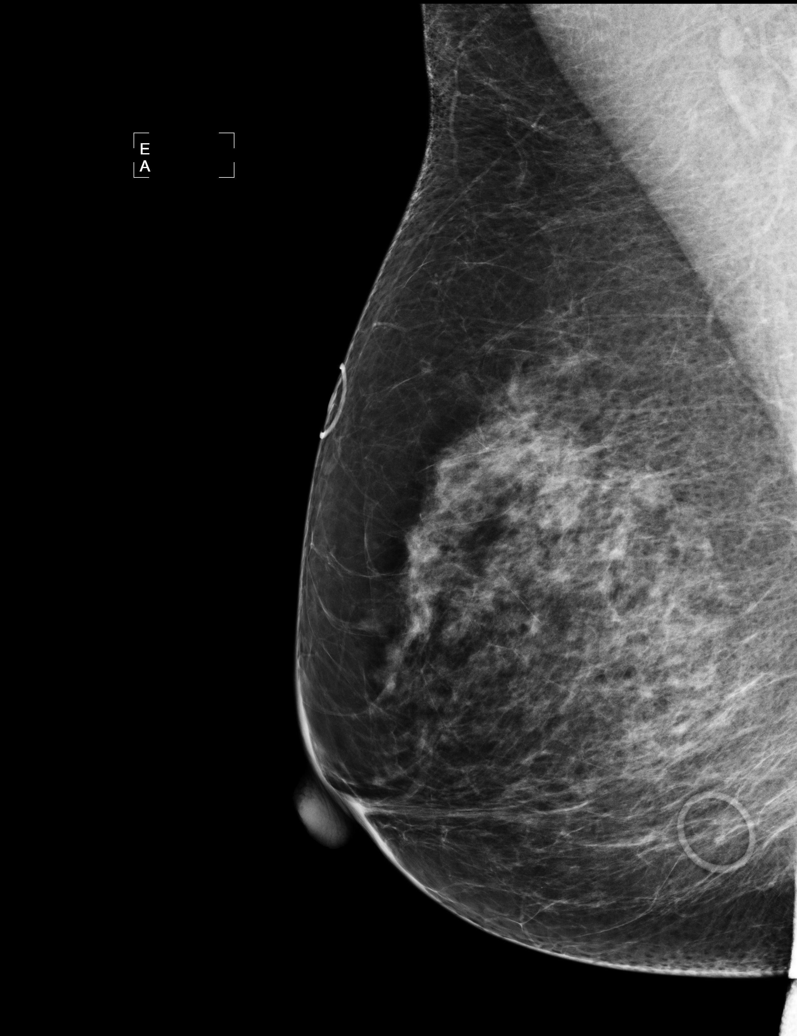

[5 of 5 positions shown; findings below may reference images not displayed]

IMPRESSION: Possible mass, right breast.  Additional evaluation is indicated.  The patient will be contacted 
for additional studies and a supplementary report will follow.  No specific mammographic evidence 
of malignancy, left breast.

ASSESSMENT: Need additional imaging evaluation and/or prior mammograms for comparison - BI-RADS 0

Further imaging of the right breast.
,

## 2011-07-22 ENCOUNTER — Other Ambulatory Visit: Payer: Self-pay | Admitting: Family Medicine

## 2011-07-22 DIAGNOSIS — R928 Other abnormal and inconclusive findings on diagnostic imaging of breast: Secondary | ICD-10-CM

## 2011-08-08 ENCOUNTER — Ambulatory Visit
Admission: RE | Admit: 2011-08-08 | Discharge: 2011-08-08 | Disposition: A | Discharge status: Home / Self Care | Payer: Medicare Other | Source: Ambulatory Visit | Attending: Family Medicine | Admitting: Family Medicine

## 2011-08-08 DIAGNOSIS — R928 Other abnormal and inconclusive findings on diagnostic imaging of breast: Secondary | ICD-10-CM

## 2011-08-08 IMAGING — US US BREAST R
1 series · 4 of 4 positions shown · non-contrast
Comparison: Multiple priors

CLINICAL DATA: Abnormal screening, right breast

DIGITAL DIAGNOSTIC RIGHT MAMMOGRAM WITHOUT CAD AND RIGHT BREAST
ULTRASOUND:

[Series 1: us breast right · 4 of 4 slices shown]
[im 1/4]
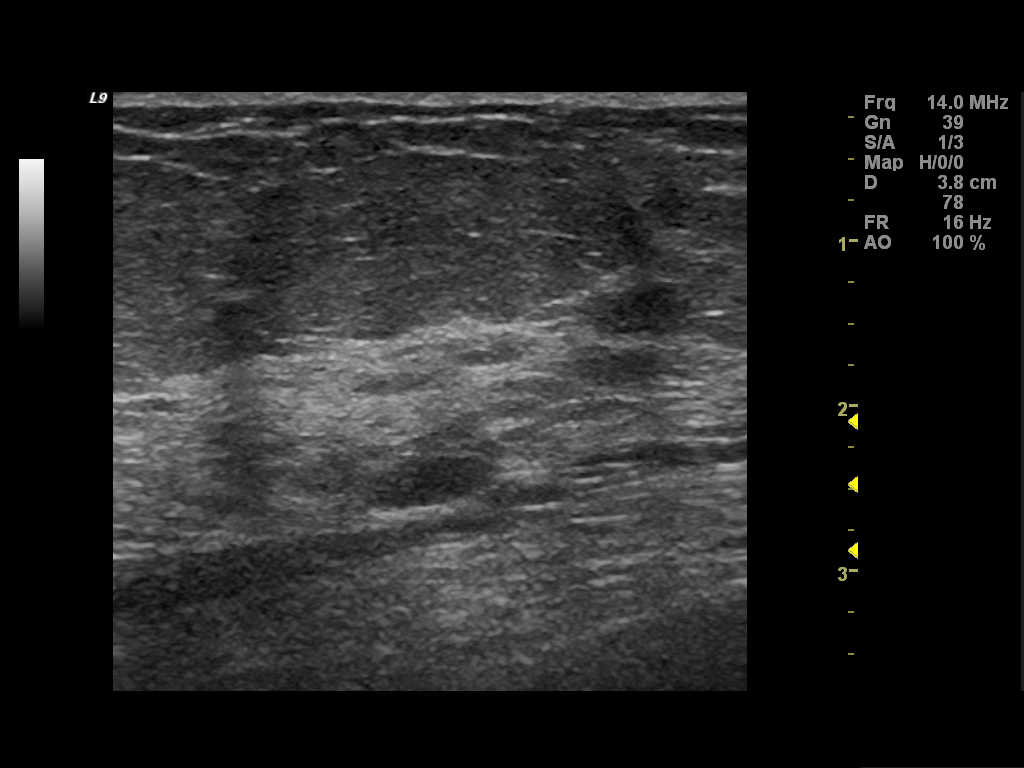
[im 2/4]
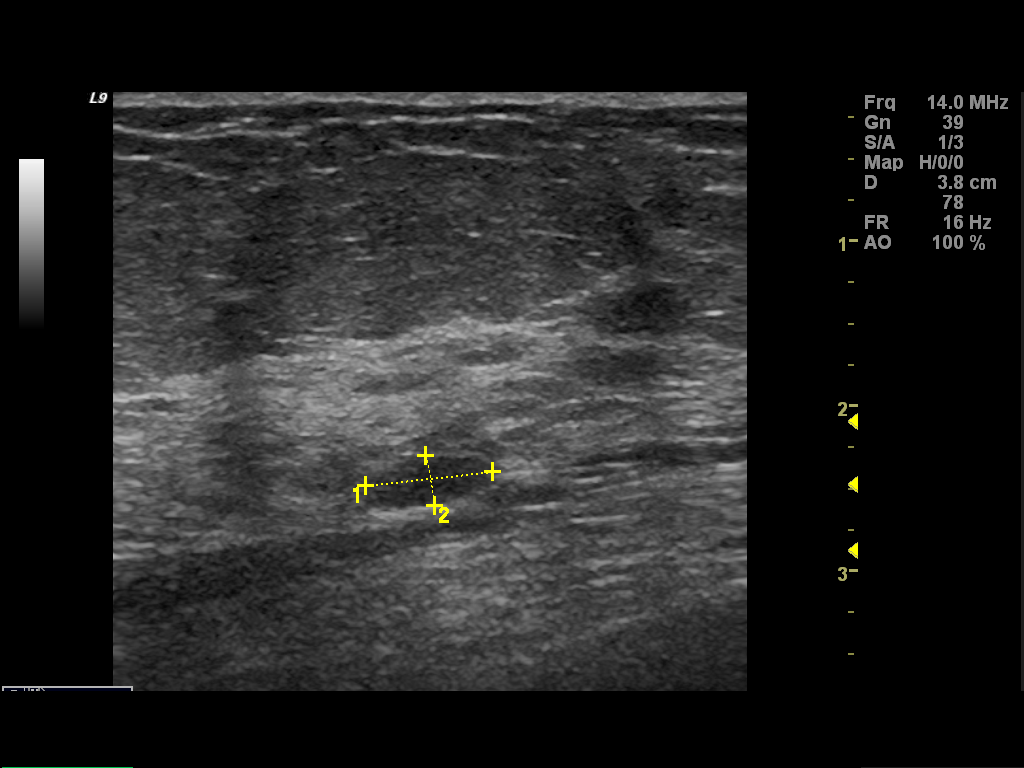
[im 3/4]
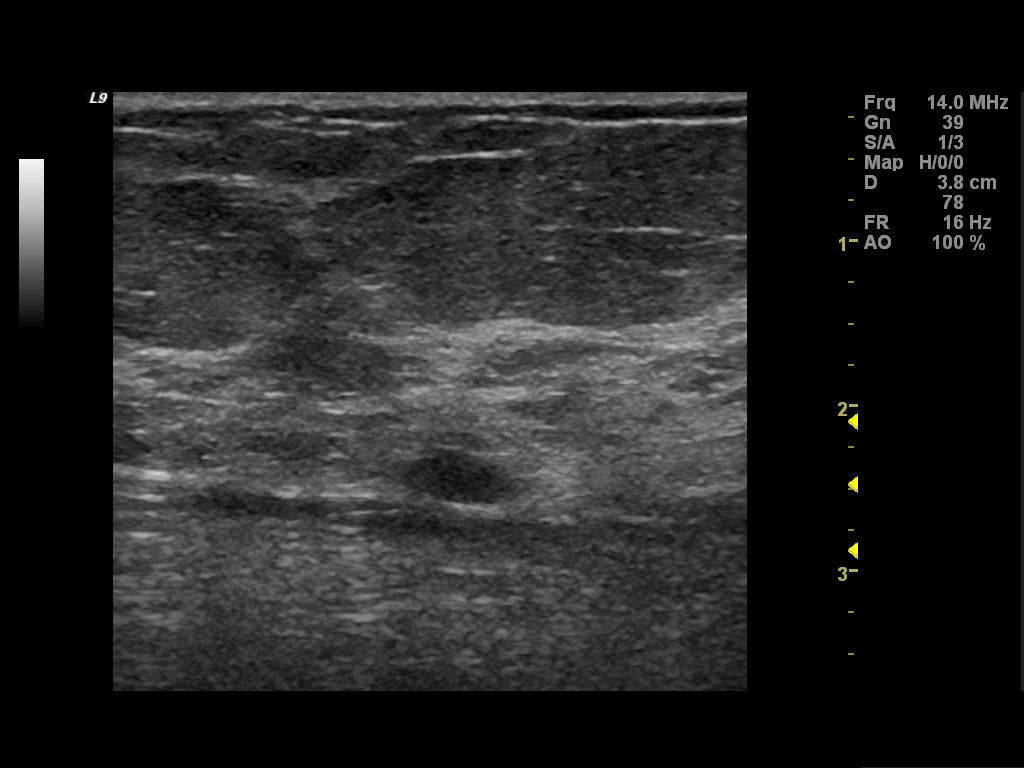
[im 4/4]
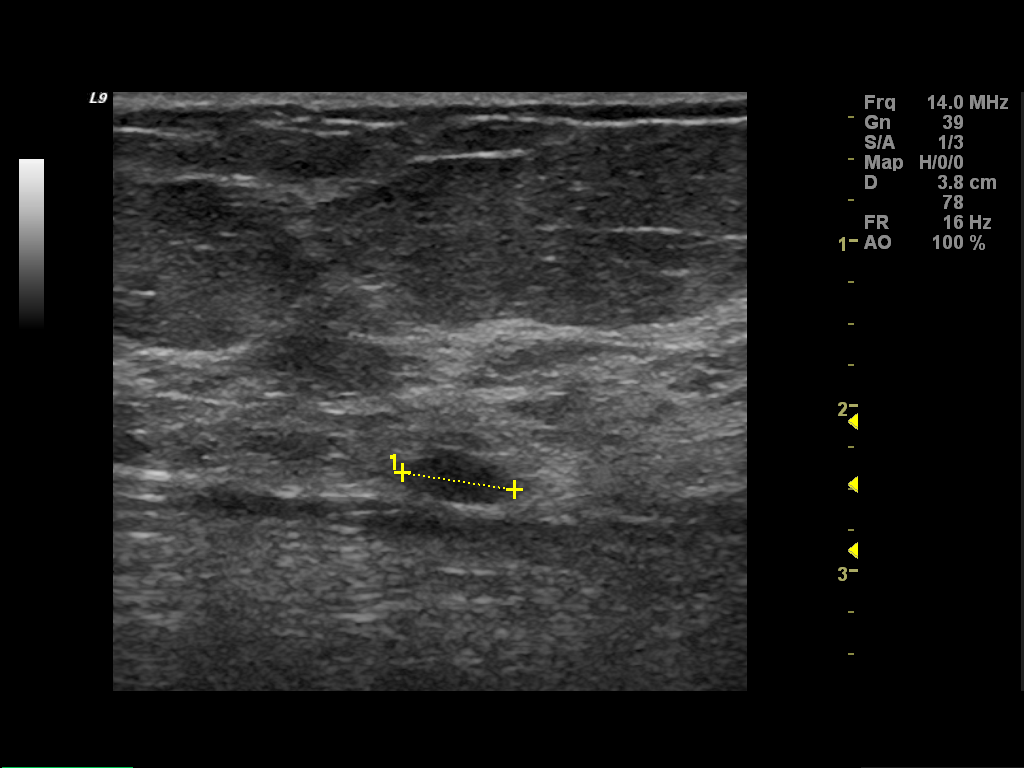

[4 of 4 positions shown; findings below may reference images not displayed]

FINDINGS: Spot compression views in the upper inner quadrant of
the right breast, posteriorly confirm the presence of an oval low
density mass with partially circumscribed and partially obscured
margins.  Compared to priors.

On physical exam, I palpate normal tissue in the upper-inner
quadrant of the right breast.

Ultrasound is performed, showing a cyst in the 12 o'clock position,
4 cm from the nipple measuring 0.8 x 0.3 x 0.7 cm.  This
corresponds to the mass seen mammographically.
IMPRESSION: Cyst, right breast.  Recommend screening mammography in 1 year.

BI-RADS CATEGORY 2:  Benign finding(s).

## 2011-08-08 IMAGING — MG MM DIGITAL DIAGNOSTIC LIMITED*R*
2 series · 2 of 2 positions shown · non-contrast
Comparison: Multiple priors

CLINICAL DATA: Abnormal screening, right breast

DIGITAL DIAGNOSTIC RIGHT MAMMOGRAM WITHOUT CAD AND RIGHT BREAST
ULTRASOUND:

[R CC]
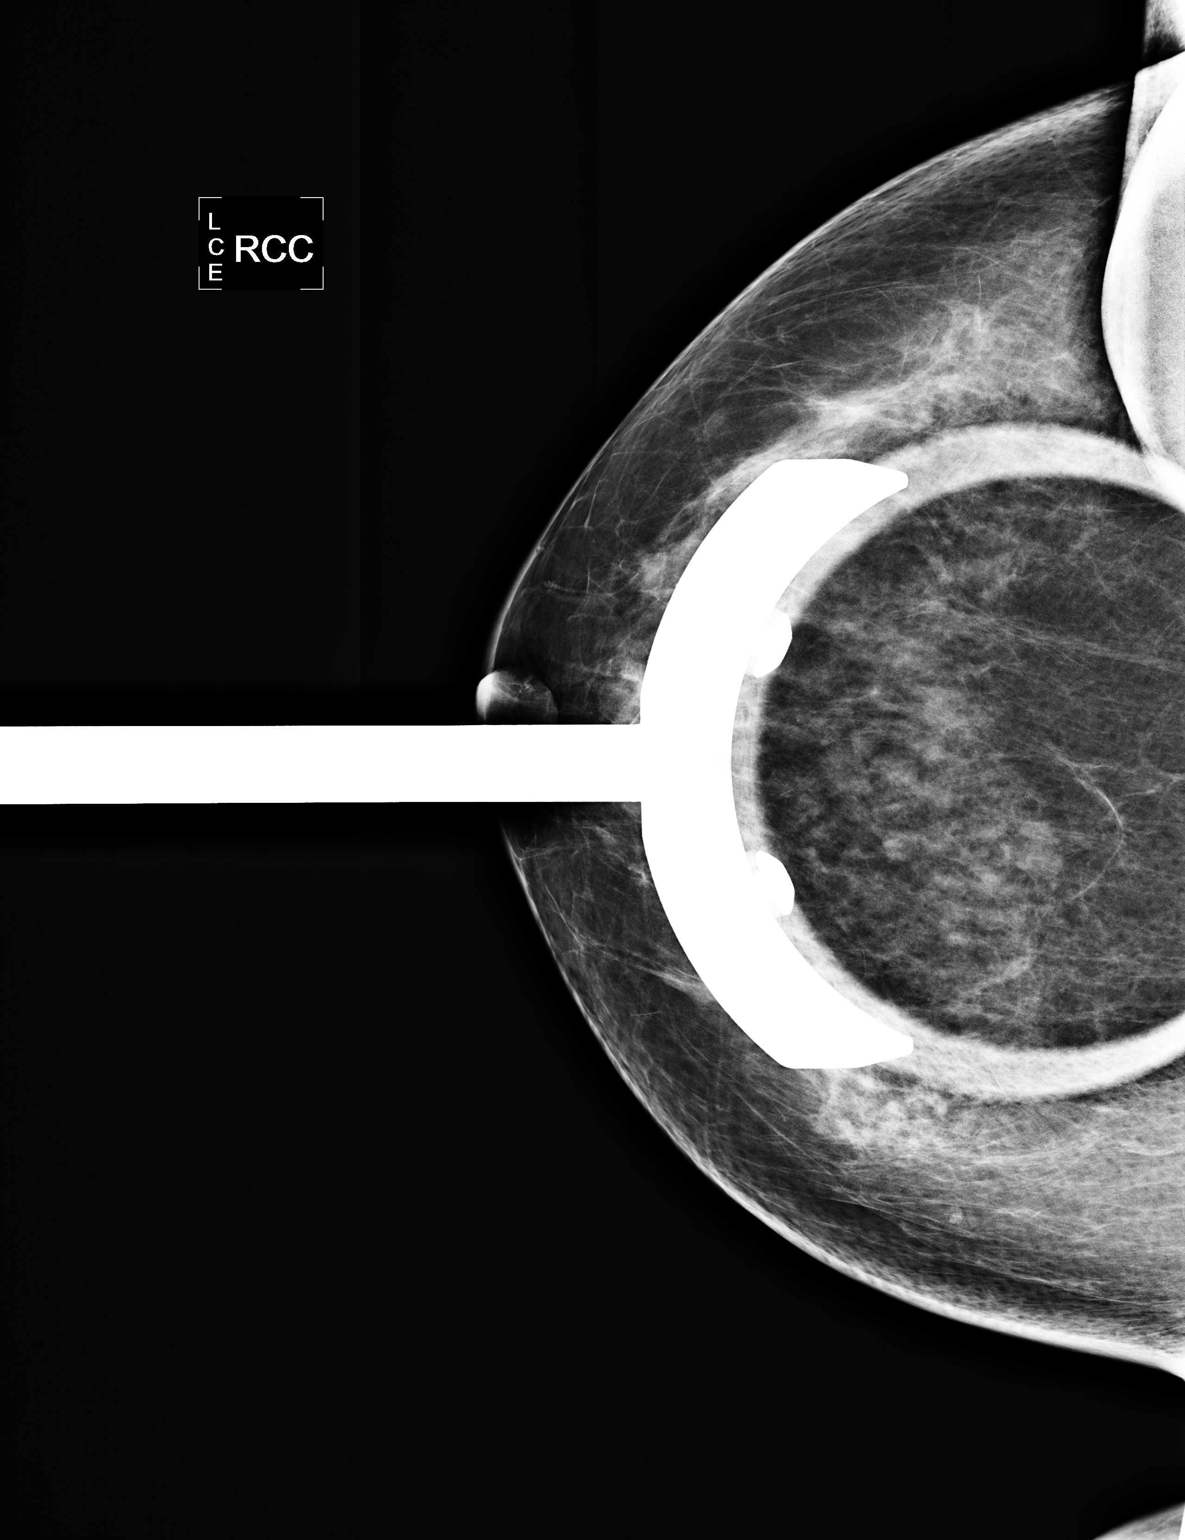

[R MLO]
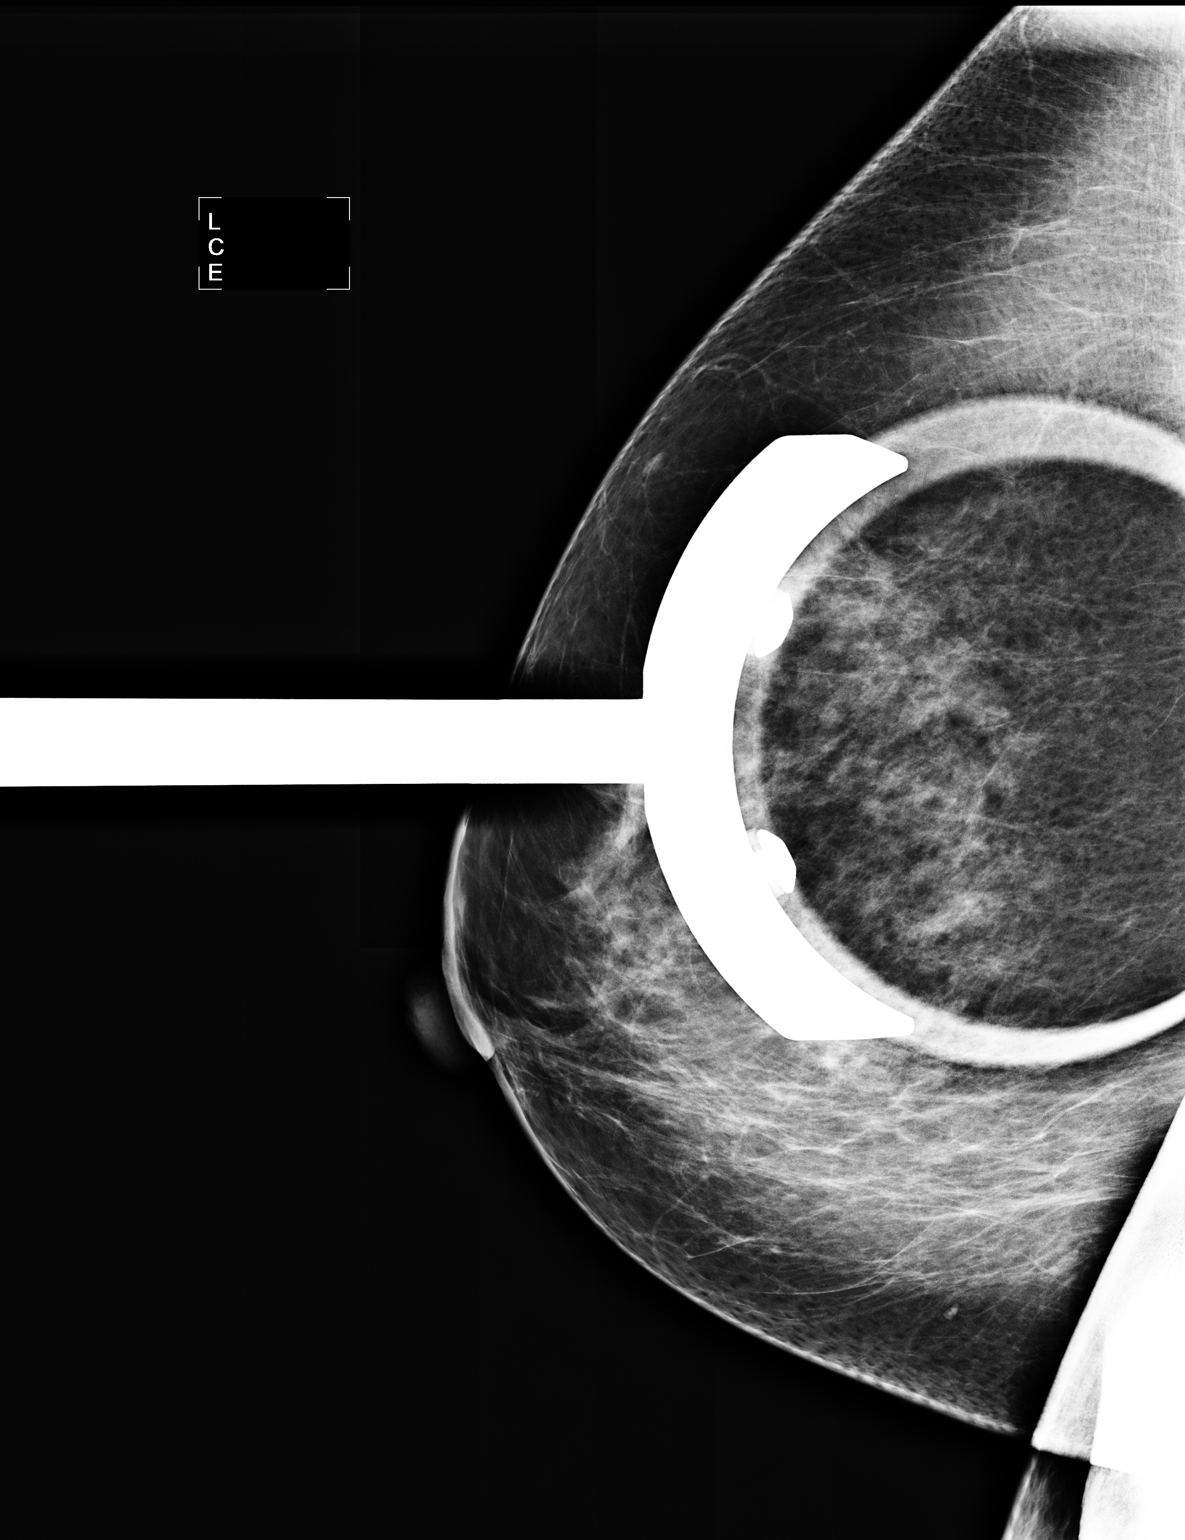

[2 of 2 positions shown; findings below may reference images not displayed]

FINDINGS: Spot compression views in the upper inner quadrant of
the right breast, posteriorly confirm the presence of an oval low
density mass with partially circumscribed and partially obscured
margins.  Compared to priors.

On physical exam, I palpate normal tissue in the upper-inner
quadrant of the right breast.

Ultrasound is performed, showing a cyst in the 12 o'clock position,
4 cm from the nipple measuring 0.8 x 0.3 x 0.7 cm.  This
corresponds to the mass seen mammographically.
IMPRESSION: Cyst, right breast.  Recommend screening mammography in 1 year.

BI-RADS CATEGORY 2:  Benign finding(s).

## 2012-05-01 ENCOUNTER — Other Ambulatory Visit: Payer: Self-pay | Admitting: Family Medicine

## 2012-05-01 DIAGNOSIS — Z1231 Encounter for screening mammogram for malignant neoplasm of breast: Secondary | ICD-10-CM

## 2012-05-01 DIAGNOSIS — Z780 Asymptomatic menopausal state: Secondary | ICD-10-CM

## 2012-05-01 DIAGNOSIS — M8580 Other specified disorders of bone density and structure, unspecified site: Secondary | ICD-10-CM

## 2012-06-20 ENCOUNTER — Other Ambulatory Visit: Payer: Self-pay | Admitting: Urology

## 2012-08-28 ENCOUNTER — Encounter (HOSPITAL_BASED_OUTPATIENT_CLINIC_OR_DEPARTMENT_OTHER): Payer: Self-pay | Admitting: *Deleted

## 2012-08-28 NOTE — Progress Notes (Addendum)
To [facility] AT 0815- istat,Ekg on arrival-Npo after Mn-will take atenolol with small amt water that am-guidelines for RCC reviewed.reviewed with Dr Council Mechanic CXR unnecessary.

## 2012-08-31 ENCOUNTER — Ambulatory Visit
Admission: RE | Admit: 2012-08-31 | Discharge: 2012-08-31 | Disposition: A | Discharge status: Home / Self Care | Payer: Medicare Other | Source: Ambulatory Visit | Attending: Family Medicine | Admitting: Family Medicine

## 2012-08-31 DIAGNOSIS — Z1231 Encounter for screening mammogram for malignant neoplasm of breast: Secondary | ICD-10-CM

## 2012-08-31 DIAGNOSIS — M8580 Other specified disorders of bone density and structure, unspecified site: Secondary | ICD-10-CM

## 2012-08-31 DIAGNOSIS — Z780 Asymptomatic menopausal state: Secondary | ICD-10-CM

## 2012-09-03 ENCOUNTER — Encounter (HOSPITAL_BASED_OUTPATIENT_CLINIC_OR_DEPARTMENT_OTHER)
Admission: RE | Disposition: A | Discharge status: Home / Self Care | Payer: Self-pay | Source: Ambulatory Visit | Attending: Urology

## 2012-09-03 ENCOUNTER — Ambulatory Visit (HOSPITAL_BASED_OUTPATIENT_CLINIC_OR_DEPARTMENT_OTHER)
Admission: RE | Admit: 2012-09-03 | Discharge: 2012-09-04 | Disposition: A | Discharge status: Home / Self Care | Payer: Medicare Other | Source: Ambulatory Visit | Attending: Urology | Admitting: Urology

## 2012-09-03 ENCOUNTER — Encounter (HOSPITAL_BASED_OUTPATIENT_CLINIC_OR_DEPARTMENT_OTHER): Payer: Self-pay | Admitting: Anesthesiology

## 2012-09-03 ENCOUNTER — Ambulatory Visit (HOSPITAL_BASED_OUTPATIENT_CLINIC_OR_DEPARTMENT_OTHER): Payer: Medicare Other | Admitting: Anesthesiology

## 2012-09-03 ENCOUNTER — Encounter (HOSPITAL_BASED_OUTPATIENT_CLINIC_OR_DEPARTMENT_OTHER): Payer: Self-pay

## 2012-09-03 DIAGNOSIS — Z90710 Acquired absence of both cervix and uterus: Secondary | ICD-10-CM | POA: Insufficient documentation

## 2012-09-03 DIAGNOSIS — N393 Stress incontinence (female) (male): Secondary | ICD-10-CM | POA: Insufficient documentation

## 2012-09-03 DIAGNOSIS — Z79899 Other long term (current) drug therapy: Secondary | ICD-10-CM | POA: Insufficient documentation

## 2012-09-03 DIAGNOSIS — N3941 Urge incontinence: Secondary | ICD-10-CM | POA: Insufficient documentation

## 2012-09-03 DIAGNOSIS — I10 Essential (primary) hypertension: Secondary | ICD-10-CM | POA: Insufficient documentation

## 2012-09-03 DIAGNOSIS — K219 Gastro-esophageal reflux disease without esophagitis: Secondary | ICD-10-CM | POA: Insufficient documentation

## 2012-09-03 HISTORY — DX: Malignant (primary) neoplasm, unspecified: C80.1

## 2012-09-03 HISTORY — DX: Pain in unspecified knee: M25.569

## 2012-09-03 HISTORY — PX: PUBOVAGINAL SLING: SHX1035

## 2012-09-03 HISTORY — DX: Cerebral aneurysm, nonruptured: I67.1

## 2012-09-03 HISTORY — DX: Urgency of urination: R39.15

## 2012-09-03 HISTORY — PX: CYSTOSCOPY: SHX5120

## 2012-09-03 HISTORY — DX: Essential (primary) hypertension: I10

## 2012-09-03 HISTORY — DX: Unspecified urinary incontinence: R32

## 2012-09-03 HISTORY — DX: Gastro-esophageal reflux disease without esophagitis: K21.9

## 2012-09-03 LAB — POCT I-STAT 4, (NA,K, GLUC, HGB,HCT)
HCT: 47 % — ABNORMAL HIGH (ref 36.0–46.0)
Sodium: 143 mEq/L (ref 135–145)

## 2012-09-03 SURGERY — CREATION, PUBOVAGINAL SLING
Anesthesia: General | Site: Bladder

## 2012-09-03 MED ORDER — ONDANSETRON HCL 4 MG/2ML IJ SOLN
INTRAMUSCULAR | Status: DC | PRN
  Administered 2012-09-03: 4 mg via INTRAVENOUS

## 2012-09-03 MED ORDER — SODIUM CHLORIDE 0.9 % IR SOLN
Status: DC | PRN
  Administered 2012-09-03: 10:00:00

## 2012-09-03 MED ORDER — STERILE WATER FOR IRRIGATION IR SOLN
Status: DC | PRN
  Administered 2012-09-03: 3000 mL

## 2012-09-03 MED ORDER — OXYBUTYNIN CHLORIDE 5 MG PO TABS
5.0000 mg | ORAL_TABLET | Freq: Three times a day (TID) | ORAL | Status: DC | PRN
  Filled 2012-09-03: qty 1

## 2012-09-03 MED ORDER — LACTATED RINGERS IV SOLN
INTRAVENOUS | Status: DC
  Administered 2012-09-03 (×3): via INTRAVENOUS
  Filled 2012-09-03: qty 1000

## 2012-09-03 MED ORDER — PROMETHAZINE HCL 25 MG/ML IJ SOLN
6.2500 mg | INTRAMUSCULAR | Status: DC | PRN
  Filled 2012-09-03: qty 1

## 2012-09-03 MED ORDER — BACITRACIN-NEOMYCIN-POLYMYXIN 400-5-5000 EX OINT
1.0000 "application " | TOPICAL_OINTMENT | Freq: Three times a day (TID) | CUTANEOUS | Status: DC | PRN
  Filled 2012-09-03: qty 1

## 2012-09-03 MED ORDER — ACETAMINOPHEN 10 MG/ML IV SOLN
INTRAVENOUS | Status: DC | PRN
  Administered 2012-09-03: 1000 mg via INTRAVENOUS

## 2012-09-03 MED ORDER — DEXTROSE-NACL 5-0.45 % IV SOLN
INTRAVENOUS | Status: DC
  Administered 2012-09-03: via INTRAVENOUS
  Filled 2012-09-03: qty 1000

## 2012-09-03 MED ORDER — INDIGOTINDISULFONATE SODIUM 8 MG/ML IJ SOLN
INTRAMUSCULAR | Status: DC | PRN
  Administered 2012-09-03: 5 mL via INTRAVENOUS

## 2012-09-03 MED ORDER — CIPROFLOXACIN HCL 500 MG PO TABS
500.0000 mg | ORAL_TABLET | Freq: Two times a day (BID) | ORAL | Status: DC
  Administered 2012-09-03 – 2012-09-04 (×2): 500 mg via ORAL
  Filled 2012-09-03: qty 1

## 2012-09-03 MED ORDER — GLYCOPYRROLATE 0.2 MG/ML IJ SOLN
INTRAMUSCULAR | Status: DC | PRN
  Administered 2012-09-03: 0.2 mg via INTRAVENOUS

## 2012-09-03 MED ORDER — CIPROFLOXACIN HCL 500 MG PO TABS
500.0000 mg | ORAL_TABLET | Freq: Two times a day (BID) | ORAL | Status: DC
  Administered 2012-09-03: 500 mg via ORAL
  Filled 2012-09-03: qty 1

## 2012-09-03 MED ORDER — DIPHENHYDRAMINE HCL 50 MG/ML IJ SOLN
12.5000 mg | Freq: Four times a day (QID) | INTRAMUSCULAR | Status: DC | PRN
  Filled 2012-09-03: qty 0.25

## 2012-09-03 MED ORDER — ZOLPIDEM TARTRATE 5 MG PO TABS
5.0000 mg | ORAL_TABLET | Freq: Every evening | ORAL | Status: DC | PRN
  Filled 2012-09-03: qty 1

## 2012-09-03 MED ORDER — ONDANSETRON HCL 4 MG/2ML IJ SOLN
4.0000 mg | INTRAMUSCULAR | Status: DC | PRN
  Filled 2012-09-03: qty 2

## 2012-09-03 MED ORDER — BELLADONNA ALKALOIDS-OPIUM 16.2-60 MG RE SUPP
RECTAL | Status: DC | PRN
  Administered 2012-09-03: 1 via RECTAL

## 2012-09-03 MED ORDER — FENTANYL CITRATE 0.05 MG/ML IJ SOLN
25.0000 ug | INTRAMUSCULAR | Status: DC | PRN
  Filled 2012-09-03: qty 1

## 2012-09-03 MED ORDER — FENTANYL CITRATE 0.05 MG/ML IJ SOLN
INTRAMUSCULAR | Status: DC | PRN
  Administered 2012-09-03 (×4): 25 ug via INTRAVENOUS
  Administered 2012-09-03: 50 ug via INTRAVENOUS
  Administered 2012-09-03 (×4): 25 ug via INTRAVENOUS

## 2012-09-03 MED ORDER — BUPIVACAINE-EPINEPHRINE 0.5% -1:200000 IJ SOLN
INTRAMUSCULAR | Status: DC | PRN
  Administered 2012-09-03: 10 mL

## 2012-09-03 MED ORDER — LIDOCAINE HCL (CARDIAC) 20 MG/ML IV SOLN
INTRAVENOUS | Status: DC | PRN
  Administered 2012-09-03: 50 mg via INTRAVENOUS

## 2012-09-03 MED ORDER — BISACODYL 5 MG PO TBEC
5.0000 mg | DELAYED_RELEASE_TABLET | Freq: Every day | ORAL | Status: DC | PRN
  Filled 2012-09-03: qty 1

## 2012-09-03 MED ORDER — MIDAZOLAM HCL 5 MG/5ML IJ SOLN
INTRAMUSCULAR | Status: DC | PRN
  Administered 2012-09-03: 1 mg via INTRAVENOUS

## 2012-09-03 MED ORDER — HYDROMORPHONE HCL PF 1 MG/ML IJ SOLN
0.5000 mg | INTRAMUSCULAR | Status: DC | PRN
  Filled 2012-09-03: qty 1

## 2012-09-03 MED ORDER — DIPHENHYDRAMINE HCL 12.5 MG/5ML PO ELIX
12.5000 mg | ORAL_SOLUTION | Freq: Four times a day (QID) | ORAL | Status: DC | PRN
  Filled 2012-09-03: qty 5

## 2012-09-03 MED ORDER — CEFAZOLIN SODIUM-DEXTROSE 2-3 GM-% IV SOLR
2.0000 g | INTRAVENOUS | Status: AC
  Administered 2012-09-03: 2 g via INTRAVENOUS
  Filled 2012-09-03: qty 50

## 2012-09-03 MED ORDER — PROPOFOL 10 MG/ML IV BOLUS
INTRAVENOUS | Status: DC | PRN
  Administered 2012-09-03: 150 mg via INTRAVENOUS

## 2012-09-03 MED ORDER — EPHEDRINE SULFATE 50 MG/ML IJ SOLN
INTRAMUSCULAR | Status: DC | PRN
  Administered 2012-09-03: 5 mg via INTRAVENOUS
  Administered 2012-09-03 (×2): 10 mg via INTRAVENOUS

## 2012-09-03 MED ORDER — ACETAMINOPHEN 10 MG/ML IV SOLN
1000.0000 mg | Freq: Four times a day (QID) | INTRAVENOUS | Status: DC
  Administered 2012-09-03 – 2012-09-04 (×3): 1000 mg via INTRAVENOUS
  Filled 2012-09-03: qty 100

## 2012-09-03 MED ORDER — HYDROCODONE-ACETAMINOPHEN 5-325 MG PO TABS
1.0000 | ORAL_TABLET | ORAL | Status: DC | PRN
  Filled 2012-09-03: qty 2

## 2012-09-03 MED ORDER — DEXAMETHASONE SODIUM PHOSPHATE 4 MG/ML IJ SOLN
INTRAMUSCULAR | Status: DC | PRN
  Administered 2012-09-03: 10 mg via INTRAVENOUS

## 2012-09-03 SURGICAL SUPPLY — 62 items
ADH SKN CLS APL DERMABOND .7 (GAUZE/BANDAGES/DRESSINGS) ×2
BAG URINE DRAINAGE (UROLOGICAL SUPPLIES) ×5 IMPLANT
BLADE SURG 10 STRL SS (BLADE) ×3 IMPLANT
BLADE SURG 15 STRL LF DISP TIS (BLADE) ×2 IMPLANT
BLADE SURG 15 STRL SS (BLADE) ×3
BLADE SURG ROTATE 9660 (MISCELLANEOUS) ×3 IMPLANT
BOOTIES KNEE HIGH SLOAN (MISCELLANEOUS) ×3 IMPLANT
BOSTON SCIENTIFIC LYNX SYSTEM ×1 IMPLANT
CANISTER SUCTION 1200CC (MISCELLANEOUS) IMPLANT
CANISTER SUCTION 2500CC (MISCELLANEOUS) ×6 IMPLANT
CATH FOLEY 2WAY SLVR  5CC 16FR (CATHETERS) ×1
CATH FOLEY 2WAY SLVR 5CC 16FR (CATHETERS) ×2 IMPLANT
CLOTH BEACON ORANGE TIMEOUT ST (SAFETY) ×3 IMPLANT
COVER MAYO STAND STRL (DRAPES) ×3 IMPLANT
COVER TABLE BACK 60X90 (DRAPES) ×3 IMPLANT
DERMABOND ADVANCED (GAUZE/BANDAGES/DRESSINGS) ×1
DERMABOND ADVANCED .7 DNX12 (GAUZE/BANDAGES/DRESSINGS) ×1 IMPLANT
DEVICE CAPIO SLIM BOX (INSTRUMENTS) IMPLANT
DISSECTOR ROUND CHERRY 3/8 STR (MISCELLANEOUS) IMPLANT
DRAPE CAMERA CLOSED 9X96 (DRAPES) ×3 IMPLANT
DRAPE UNDERBUTTOCKS STRL (DRAPE) ×3 IMPLANT
FLOSEAL 10ML (HEMOSTASIS) IMPLANT
GAUZE SPONGE 4X4 16PLY XRAY LF (GAUZE/BANDAGES/DRESSINGS) ×1 IMPLANT
GLOVE BIO SURGEON STRL SZ7.5 (GLOVE) ×4 IMPLANT
GLOVE BIO SURGEON STRL SZ8.5 (GLOVE) ×2 IMPLANT
GOWN PREVENTION PLUS LG XLONG (DISPOSABLE) ×3 IMPLANT
GOWN PREVENTION PLUS XXLARGE (GOWN DISPOSABLE) ×2 IMPLANT
GOWN STRL REIN XL XLG (GOWN DISPOSABLE) ×3 IMPLANT
HOOK RETRACTION 12 ELAST STAY (MISCELLANEOUS) IMPLANT
NDL 1/2 CIR CATGUT .05X1.09 (NEEDLE) IMPLANT
NEEDLE 1/2 CIR CATGUT .05X1.09 (NEEDLE) IMPLANT
NEEDLE HYPO 22GX1.5 SAFETY (NEEDLE) ×4 IMPLANT
PACKING VAGINAL (PACKING) ×3 IMPLANT
PENCIL BUTTON HOLSTER BLD 10FT (ELECTRODE) ×3 IMPLANT
PLUG CATH AND CAP STER (CATHETERS) ×3 IMPLANT
RETRACTOR LONRSTAR 16.6X16.6CM (MISCELLANEOUS) ×2 IMPLANT
RETRACTOR STAY HOOK 5MM (MISCELLANEOUS) ×3 IMPLANT
RETRACTOR STER APS 16.6X16.6CM (MISCELLANEOUS) ×3
SET IRRIG Y TYPE TUR BLADDER L (SET/KITS/TRAYS/PACK) ×3 IMPLANT
SHEET LAVH (DRAPES) ×3 IMPLANT
SLING LYNX SUPRAPUBIC BX (SLING) IMPLANT
SLING SOLYX SYSTEM SIS BX (SLING) IMPLANT
SPONGE LAP 4X18 X RAY DECT (DISPOSABLE) ×3 IMPLANT
SUCTION FRAZIER TIP 10 FR DISP (SUCTIONS) ×3 IMPLANT
SUT ABS MONO DBL WITH NDL 48IN (SUTURE) IMPLANT
SUT ETHILON 2 0 PS N (SUTURE) IMPLANT
SUT MON AB 2-0 SH 27 (SUTURE)
SUT MON AB 2-0 SH27 (SUTURE) IMPLANT
SUT NONABSORB MONO DB W/NDL 48 (SUTURE) IMPLANT
SUT PDS AB 3-0 SH 27 (SUTURE) IMPLANT
SUT SILK 3 0 PS 1 (SUTURE) ×1 IMPLANT
SUT VIC AB 0 CT1 36 (SUTURE) ×2 IMPLANT
SUT VIC AB 2-0 CT1 27 (SUTURE)
SUT VIC AB 2-0 CT1 TAPERPNT 27 (SUTURE) IMPLANT
SUT VIC AB 2-0 UR6 27 (SUTURE) ×3 IMPLANT
SYR BULB IRRIGATION 50ML (SYRINGE) ×3 IMPLANT
SYR CONTROL 10ML LL (SYRINGE) ×3 IMPLANT
SYRINGE 10CC LL (SYRINGE) ×3 IMPLANT
TRAY DSU PREP LF (CUSTOM PROCEDURE TRAY) ×3 IMPLANT
TUBE CONNECTING 12X1/4 (SUCTIONS) ×6 IMPLANT
WATER STERILE IRR 500ML POUR (IV SOLUTION) ×6 IMPLANT
YANKAUER SUCT BULB TIP NO VENT (SUCTIONS) ×1 IMPLANT

## 2012-09-03 NOTE — Progress Notes (Signed)
Assessment completed Patient denies any pain or discomfort. Vaginal packing in place peri pad changed small amount of bloody drainage. Taking fluids ok. Call light in reach. Will continue to monitor.

## 2012-09-03 NOTE — Transfer of Care (Signed)
Immediate Anesthesia Transfer of Care Note  Patient: Kimberly Gay  Procedure(s) Performed: Procedure(s) (LRB): PUBO-VAGINAL SLING (N/A) CYSTOSCOPY (N/A)  Patient Location: PACU  Anesthesia Type: General  Level of Consciousness: sleepy  Airway & Oxygen Therapy: Patient Spontanous Breathing and Patient connected to face mask oxygen  Post-op Assessment: Report given to PACU RN and Post -op Vital signs reviewed and stable  Post vital signs: Reviewed and stable  Complications: No apparent anesthesia complications

## 2012-09-03 NOTE — H&P (Signed)
Chief Complaint  cc:  Cain Saupe, MD   Active Problems Problems  1. 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 Urge Incontinence Of Urine 788.31      73 yo female referred by Dr. Jillyn Hidden for further evaluation of stress incontinence.   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 She is post hysterectomy, and is P-0. Flonnie Hailstone has + family hx of SUI ( mother , and sister). No tobacco history. No Asthma. She is planning to go back to the Gym for "silver Sneakers" exercises in January.   Past Medical History Problems  1. History of  Cancer 199.1 2. History of  Esophageal Reflux 530.81 3. History of  Hypertension 401.9  Surgical History Problems  1. History of  Cholecystectomy 2. History of  Hysterectomy V45.77 3. History of  Neck Surgery 4. History of  Rotator Cuff Repair  Current Meds 1. Alendronate Sodium 70 MG Oral Tablet; Therapy: 19May2013 to 2. Atenolol 50 MG Oral Tablet; Therapy: 09May2013 to 3. Calcium TABS; Therapy: (Recorded:08Nov2013) to 4. Hydrochlorothiazide 25 MG Oral Tablet; Therapy: 05Sep2012 to 5. Mucinex 600 MG Oral Tablet Extended Release 12 Hour; Therapy: (Recorded:08Nov2013) to 6. Vitamin D3 TABS; Therapy: (Recorded:08Nov2013) to 7. Zyrtec TABS; Therapy: (Recorded:08Nov2013) to  Allergies Medication  1. No Known Drug Allergies  Family History Problems  1. Paternal history of  Cardiac Failure 2. Paternal history of  Diabetes Mellitus V18.0 3. Family history of  Father Deceased At Age 45 heart failure 4. Family history of  Mother Deceased At Age 85 natural causes 5. Maternal history of  Transient Ischemic Attack  Social History Problems    Caffeine Use   Marital History - Widowed   Never A Smoker   Occupation: Runner, broadcasting/film/video Denied    History of  Alcohol Use  Review of  Systems Genitourinary, constitutional, skin, eye, otolaryngeal, hematologic/lymphatic, cardiovascular, pulmonary, endocrine, musculoskeletal, gastrointestinal, neurological and psychiatric system(s) were reviewed and pertinent findings if present are noted.  Genitourinary: incontinence.  Gastrointestinal: diarrhea.  Eyes: blurred vision.    Vitals Vital Signs [Data Includes: Last 1 Day]  08Nov2013 02:51PM  BMI Calculated: 31.76 BSA Calculated: 1.9 Height: 5 ft 4 in Weight: 186 lb  Blood Pressure: 161 / 83 Temperature: 98 F Heart Rate: 59  Physical Exam Genitourinary:. + Marshall and + Q-tip test.  Chaperone Present: .  Examination of the external genitalia shows normal female external genitalia and no lesions. The urethra is normal in appearance and not tender. There is no urethral mass. Vaginal exam demonstrates no abnormalities. The adnexa are palpably normal. The bladder is non tender and not distended. The anus is normal on inspection. The perineum is normal on inspection.    Results/Data Urine [Data Includes: Last 1 Day]   08Nov2013  COLOR YELLOW   APPEARANCE CLEAR   SPECIFIC GRAVITY 1.030   pH 5.5   GLUCOSE NEG mg/dL  BILIRUBIN NEG   KETONE NEG mg/dL  BLOOD TRACE   PROTEIN NEG mg/dL  UROBILINOGEN 0.2 mg/dL  NITRITE NEG   LEUKOCYTE ESTERASE NEG   SQUAMOUS EPITHELIAL/HPF RARE   WBC 0-2 WBC/hpf  RBC 0-2 RBC/hpf  BACTERIA RARE   CRYSTALS NONE SEEN   CASTS NONE SEEN    Procedure     Gaynell Face test:  PVR - 100cc and instilled 350cc of sterile water. + Marshall and + Q-tip test.  Assessment Assessed  1. 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 Urge Incontinence Of Urine 788.31      72 yo female with cough, laugh, sneeze incontinence, or leaks when exerting hieself in the garden. She has had problem for >1 yr, but now must wear a pad for protection. She has occasional urge incontinence. Pt would like to have [place] sling.   Plan Female Stress Incontinence (625.6)  1.  Follow-up Schedule Surgery Office  Follow-up  Requested for: 08Nov2013 Health Maintenance (V70.0)  2. UA With REFLEX  Done: 08Nov2013 02:15PM   Pt will have Lynx sling. She will have her surgery on a Monday and stay overnight.   Signatures Electronically signed by : Jethro Bolus, M.D.; Jun 15 2012  4:57PM

## 2012-09-03 NOTE — Interval H&P Note (Signed)
History and Physical Interval Note:  09/03/2012 9:53 AM  Kimberly Gay  has presented today for surgery, with the diagnosis of STRESS INCONTINENCE   The various methods of treatment have been discussed with the patient and family. After consideration of risks, benefits and other options for treatment, the patient has consented to  Procedure(s) (LRB) with comments: PUBO-VAGINAL SLING (N/A) - LYNX SLING  as a surgical intervention .  The patient's history has been reviewed, patient examined, no change in status, stable for surgery.  I have reviewed the patient's chart and labs.  Questions were answered to the patient's satisfaction.     Jethro Bolus I

## 2012-09-03 NOTE — Anesthesia Preprocedure Evaluation (Signed)
Anesthesia Evaluation  Patient identified by MRN, date of birth, ID band Patient awake    Reviewed: Allergy & Precautions, H&P , NPO status , Patient's Chart, lab work & pertinent test results, reviewed documented beta blocker date and time   Airway Mallampati: II TM Distance: >3 FB Neck ROM: full    Dental No notable dental hx.    Pulmonary neg pulmonary ROS,  breath sounds clear to auscultation  Pulmonary exam normal       Cardiovascular Exercise Tolerance: Good hypertension, On Medications and On Home Beta Blockers + Peripheral Vascular Disease Rhythm:regular Rate:Normal  H/O cerebral aneurysm, s/p coiling.   Neuro/Psych negative neurological ROS  negative psych ROS   GI/Hepatic Neg liver ROS, GERD-  ,  Endo/Other  negative endocrine ROS  Renal/GU negative Renal ROS  negative genitourinary   Musculoskeletal   Abdominal   Peds  Hematology negative hematology ROS (+)   Anesthesia Other Findings   Reproductive/Obstetrics negative OB ROS                           Anesthesia Physical Anesthesia Plan  ASA: II  Anesthesia Plan: General LMA   Post-op Pain Management:    Induction:   Airway Management Planned:   Additional Equipment:   Intra-op Plan:   Post-operative Plan:   Informed Consent: I have reviewed the patients History and Physical, chart, labs and discussed the procedure including the risks, benefits and alternatives for the proposed anesthesia with the patient or authorized representative who has indicated his/her understanding and acceptance.   Dental Advisory Given  Plan Discussed with: CRNA  Anesthesia Plan Comments:         Anesthesia Quick Evaluation

## 2012-09-03 NOTE — Progress Notes (Signed)
Patient up walking x 3 with help

## 2012-09-03 NOTE — Op Note (Signed)
Pre-operative diagnosis :   Stress urinary incontinence  Postoperative diagnosis:  Same  Operation:  [employer] Rx retropubic pubovaginal sling  Surgeon:  S. Patsi Sears, MD  First assistant: Venita Sheffield. PA- S2  Anesthesia:  general  Preparation:  After appropriate preanesthesia, the patient was brought to the operating room, placed on the operating table in the dorsal supine position where general LMA anesthesia was introduced. Armband was double checked. The pubis was shaven, prepped with Betadine solution, and draped in usual fashion. She was replaced in the dorsal lithotomy position. The incision points for the pubic incisions were outlined with a blue marking pen. The bladder neck was marked with marking pen. Vaginal inspection revealed urethrocele, with hypermobile urethra. The [facility] retractor was placed.  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 She is post hysterectomy, and is P-0. Flonnie Hailstone has + family hx of SUI ( mother , and sister). No tobacco history. No Asthma. She is planning to go back to the Gym for "silver Sneakers" exercises in January. + Marshall test, + Q-tip test.  Past Medical History  Problems  1. History of Cancer 199.1  2. History of Esophageal Reflux 530.81  3. History of Hypertension 401.9    Statement of  Likelihood of Success: Excellent. TIME-OUT observed.:  Procedure:  10 cc of Marcaine 0.5 with epinephrine 1-2 and thousand was injected in the periurethral space after the midline urethra was marked. 10 more cc was injected through a separate needle suprapubically, in the area of suprapubic incision.  Using a 10 blade, 2 separate 5 mm incisions are made 2 fingerbreadths above the pubic tubercle and 2 fingerbreadths lateral to the pubic tubercle, coinciding with  previously placed blue marks. Following this, a fresh 15 blade was used to make a 2 cm incision in the vaginal epithelium in the mid urethral area. Subcutaneous tissue was dissected with the Strully scissors bilaterally, to the level the pubis.  Bilateral retropubic [employer] Rx needles were then placed, with an empty bladder, and the patient in slight Trendelenburg. Cystoscopy was accomplished with indigocarmine given. The urethra appeared normal but hypermobile. There was no evidence of needles within the urethra or the bladder, either with the 70 lens, or the 30 lens. Jets of blue dye were seen from both ureteral orifices, which were normally located on the trigone. There was no evidence of bladder stone, tumor, or diverticular formation.  Cystoscope was removed and the Foley catheter was replaced in the bladder drained. The [place] mesh was then placed, with a right angle opened, for tensioning purposes. This was placed behind the mesh in the midline. The Of the mesh was then cut, and removed in standard fashion. The   plastic sleeves were then removed simultaneously, with the right angle clamp used to protect the tensioning in the midline behind the mesh. The mesh arms were then cut below the skin line at the level of pubis.  Bleeding was noted from the vaginal epithelium. The vaginal cream was closed with running and interrupted 2-0 Vicryl suture, which stopped the bleeding. Irrigation was accomplished. Vaginal packing was placed. The suprapubic incisions were closed with Dermabond. The patient was awakened and taken to recovery room in good condition.

## 2012-09-03 NOTE — Anesthesia Postprocedure Evaluation (Signed)
  Anesthesia Post-op Note  Patient: Kimberly Gay  Procedure(s) Performed: Procedure(s) (LRB): PUBO-VAGINAL SLING (N/A) CYSTOSCOPY (N/A)  Patient Location: PACU  Anesthesia Type: General  Level of Consciousness: awake and alert   Airway and Oxygen Therapy: Patient Spontanous Breathing  Post-op Pain: mild  Post-op Assessment: Post-op Vital signs reviewed, Patient's Cardiovascular Status Stable, Respiratory Function Stable, Patent Airway and No signs of Nausea or vomiting  Last Vitals:  Filed Vitals:   09/03/12 1200  BP: 131/65  Pulse: 69  Temp:   Resp: 13    Post-op Vital Signs: stable   Complications: No apparent anesthesia complications

## 2012-09-03 NOTE — Progress Notes (Signed)
Report received from [facility] in recovery.  Patient transported to [facility] room 2 for overnight stay.

## 2012-09-04 ENCOUNTER — Encounter (HOSPITAL_BASED_OUTPATIENT_CLINIC_OR_DEPARTMENT_OTHER): Payer: Self-pay | Admitting: Urology

## 2012-09-04 MED ORDER — CIPROFLOXACIN HCL 500 MG PO TABS: 500.0000 mg | ORAL_TABLET | Freq: Two times a day (BID) | ORAL | Status: DC

## 2012-09-04 MED ORDER — OXYCODONE-ACETAMINOPHEN 5-325 MG PO TABS: 1.0000 | ORAL_TABLET | ORAL | Status: DC | PRN

## 2012-09-04 NOTE — Progress Notes (Signed)
Urology Progress Note  1 Day Post-Op   Subjective: Post op [place] pubovaginal sling. No problems overnight. + void this AM. No pain.     No acute urologic events overnight. Ambulation:   positive Flatus:    positive Bowel movement  negative  Pain: complete resolution  Objective:  Blood pressure 126/65, pulse 59, temperature 97.2 F (36.2 C), temperature source Oral, resp. rate 16, height 5\' 5"  (1.651 m), weight 82.243 kg (181 lb 5 oz), SpO2 94.00%.  Physical Exam:  General:  No acute distress, awake Resp: clear to auscultation bilaterally Genitourinary:  slt bruising.  Foley:out    I/O last 3 completed shifts: In: 5010 [P.O.:2660; I.V.:2350] Out: 3925 [Urine:3725; Blood:200]  Recent Labs  [facility] 09/03/12 0902   HGB 16.0*   WBC --   PLT --    Recent Labs  [facility] 09/03/12 0902   NA 143   K 3.5   CL --   CO2 --   BUN --   CREATININE --   CALCIUM --   GFRNONAA --   GFRAA --     No results found for this basename: PT:2,INR:2,APTT:2 in the last 72 hours   No components found with this basename: ABG:2  Assessment/Plan:  Catheter removed. Pt is to increase activities as tolerated. Return to light duty in 1 week. Follow up in 1 week.

## 2012-09-04 NOTE — Progress Notes (Signed)
Dr.Tannenbaum in to see patient and her sister.  He discussed post op instuctions and discharged patient home

## 2012-09-04 NOTE — Progress Notes (Signed)
Vaginal packing and Foley D/C;d patient tolerated procedure well Taking P.O fluids well. No complaints of pain or discomfort this far.

## 2012-12-26 ENCOUNTER — Other Ambulatory Visit: Payer: Self-pay | Admitting: Gastroenterology

## 2013-08-09 ENCOUNTER — Emergency Department (HOSPITAL_COMMUNITY)
Admission: EM | Admit: 2013-08-09 | Discharge: 2013-08-09 | Disposition: A | Discharge status: Home / Self Care | Payer: Medicare Other | Attending: Emergency Medicine | Admitting: Emergency Medicine

## 2013-08-09 ENCOUNTER — Encounter (HOSPITAL_COMMUNITY): Payer: Self-pay | Admitting: Emergency Medicine

## 2013-08-09 DIAGNOSIS — Z792 Long term (current) use of antibiotics: Secondary | ICD-10-CM | POA: Insufficient documentation

## 2013-08-09 DIAGNOSIS — Z79899 Other long term (current) drug therapy: Secondary | ICD-10-CM | POA: Insufficient documentation

## 2013-08-09 DIAGNOSIS — T783XXA Angioneurotic edema, initial encounter: Secondary | ICD-10-CM | POA: Insufficient documentation

## 2013-08-09 DIAGNOSIS — Z87448 Personal history of other diseases of urinary system: Secondary | ICD-10-CM | POA: Insufficient documentation

## 2013-08-09 DIAGNOSIS — T7840XA Allergy, unspecified, initial encounter: Secondary | ICD-10-CM

## 2013-08-09 DIAGNOSIS — T360X5A Adverse effect of penicillins, initial encounter: Secondary | ICD-10-CM | POA: Insufficient documentation

## 2013-08-09 DIAGNOSIS — Z8719 Personal history of other diseases of the digestive system: Secondary | ICD-10-CM | POA: Insufficient documentation

## 2013-08-09 DIAGNOSIS — Z85038 Personal history of other malignant neoplasm of large intestine: Secondary | ICD-10-CM | POA: Insufficient documentation

## 2013-08-09 DIAGNOSIS — I10 Essential (primary) hypertension: Secondary | ICD-10-CM | POA: Insufficient documentation

## 2013-08-09 MED ORDER — METHYLPREDNISOLONE SODIUM SUCC 125 MG IJ SOLR
125.0000 mg | Freq: Once | INTRAMUSCULAR | Status: AC
  Administered 2013-08-09: 125 mg via INTRAVENOUS
  Filled 2013-08-09: qty 2

## 2013-08-09 MED ORDER — DIPHENHYDRAMINE HCL 25 MG PO TABS: 25.0000 mg | ORAL_TABLET | Freq: Four times a day (QID) | ORAL | Status: DC

## 2013-08-09 MED ORDER — EPINEPHRINE 0.3 MG/0.3ML IJ SOAJ
0.3000 mg | Freq: Once | INTRAMUSCULAR | Status: AC
  Administered 2013-08-09: 0.3 mg via INTRAMUSCULAR

## 2013-08-09 MED ORDER — FAMOTIDINE IN NACL 20-0.9 MG/50ML-% IV SOLN
20.0000 mg | Freq: Once | INTRAVENOUS | Status: AC
  Administered 2013-08-09: 20 mg via INTRAVENOUS
  Filled 2013-08-09: qty 50

## 2013-08-09 MED ORDER — DIPHENHYDRAMINE HCL 50 MG/ML IJ SOLN
25.0000 mg | Freq: Once | INTRAMUSCULAR | Status: AC
  Administered 2013-08-09: 25 mg via INTRAVENOUS
  Filled 2013-08-09: qty 1

## 2013-08-09 MED ORDER — PREDNISONE 20 MG PO TABS: 60.0000 mg | ORAL_TABLET | Freq: Every day | ORAL | Status: DC

## 2013-08-09 MED ORDER — EPINEPHRINE 0.3 MG/0.3ML IJ SOAJ: 0.3000 mg | Freq: Once | INTRAMUSCULAR | Status: AC

## 2013-08-09 MED ORDER — EPINEPHRINE 0.3 MG/0.3ML IJ SOAJ
INTRAMUSCULAR | Status: AC
  Administered 2013-08-09: 0.3 mg via INTRAMUSCULAR
  Filled 2013-08-09: qty 0.3

## 2013-08-09 MED ORDER — FAMOTIDINE 20 MG PO TABS: 20.0000 mg | ORAL_TABLET | Freq: Two times a day (BID) | ORAL | Status: DC

## 2013-08-09 NOTE — ED Notes (Addendum)
Pt reports eating a chicken sandwich, trail mix, and fruit cake yesterday. Pt does not know if reaction related to intake. Pt reports decrease in swelling around eyes and lips. Notable swelling around lips, right side of check nearest lip, and slight swelling beneath eyes at present time. Pt denies swelling on tongue. Pt denies pain and SOB.

## 2013-08-09 NOTE — ED Notes (Signed)
Pt reports swelling decreasing. Pt denies SOB.

## 2013-08-09 NOTE — Discharge Instructions (Signed)
Allergies °Allergies may happen from anything your body is sensitive to. This may be food, medicines, pollens, chemicals, and nearly anything around you in everyday life that produces allergens. An allergen is anything that causes an allergy producing substance. Heredity is often a factor in causing these problems. This means you may have some of the same allergies as your parents. °Food allergies happen in all age groups. Food allergies are some of the most severe and life threatening. Some common food allergies are cow's milk, seafood, eggs, nuts, wheat, and soybeans. °SYMPTOMS  °· Swelling around the mouth. °· An itchy red rash or hives. °· Vomiting or diarrhea. °· Difficulty breathing. °SEVERE ALLERGIC REACTIONS ARE LIFE-THREATENING. °This reaction is called anaphylaxis. It can cause the mouth and throat to swell and cause difficulty with breathing and swallowing. In severe reactions only a trace amount of food (for example, peanut oil in a salad) may cause death within seconds. °Seasonal allergies occur in all age groups. These are seasonal because they usually occur during the same season every year. They may be a reaction to molds, grass pollens, or tree pollens. Other causes of problems are house dust mite allergens, pet dander, and mold spores. The symptoms often consist of nasal congestion, a runny itchy nose associated with sneezing, and tearing itchy eyes. There is often an associated itching of the mouth and ears. The problems happen when you come in contact with pollens and other allergens. Allergens are the particles in the air that the body reacts to with an allergic reaction. This causes you to release allergic antibodies. Through a chain of events, these eventually cause you to release histamine into the blood stream. Although it is meant to be protective to the body, it is this release that causes your discomfort. This is why you were given anti-histamines to feel better.  If you are unable to  pinpoint the offending allergen, it may be determined by skin or blood testing. Allergies cannot be cured but can be controlled with medicine. °Hay fever is a collection of all or some of the seasonal allergy problems. It may often be treated with simple over-the-counter medicine such as diphenhydramine. Take medicine as directed. Do not drink alcohol or drive while taking this medicine. Check with your caregiver or package insert for child dosages. °If these medicines are not effective, there are many new medicines your caregiver can prescribe. Stronger medicine such as nasal spray, eye drops, and corticosteroids may be used if the first things you try do not work well. Other treatments such as immunotherapy or desensitizing injections can be used if all else fails. Follow up with your caregiver if problems continue. These seasonal allergies are usually not life threatening. They are generally more of a nuisance that can often be handled using medicine. °HOME CARE INSTRUCTIONS  °· If unsure what causes a reaction, keep a diary of foods eaten and symptoms that follow. Avoid foods that cause reactions. °· If hives or rash are present: °· Take medicine as directed. °· You may use an over-the-counter antihistamine (diphenhydramine) for hives and itching as needed. °· Apply cold compresses (cloths) to the skin or take baths in cool water. Avoid hot baths or showers. Heat will make a rash and itching worse. °· If you are severely allergic: °· Following a treatment for a severe reaction, hospitalization is often required for closer follow-up. °· Wear a medic-alert bracelet or necklace stating the allergy. °· You and your family must learn how to give adrenaline or use   an anaphylaxis kit. °· If you have had a severe reaction, always carry your anaphylaxis kit or EpiPen® with you. Use this medicine as directed by your caregiver if a severe reaction is occurring. Failure to do so could have a fatal outcome. °SEEK MEDICAL  CARE IF: °· You suspect a food allergy. Symptoms generally happen within 30 minutes of eating a food. °· Your symptoms have not gone away within 2 days or are getting worse. °· You develop new symptoms. °· You want to retest yourself or your child with a food or drink you think causes an allergic reaction. Never do this if an anaphylactic reaction to that food or drink has happened before. Only do this under the care of a caregiver. °SEEK IMMEDIATE MEDICAL CARE IF:  °· You have difficulty breathing, are wheezing, or have a tight feeling in your chest or throat. °· You have a swollen mouth, or you have hives, swelling, or itching all over your body. °· You have had a severe reaction that has responded to your anaphylaxis kit or an EpiPen®. These reactions may return when the medicine has worn off. These reactions should be considered life threatening. °MAKE SURE YOU:  °· Understand these instructions. °· Will watch your condition. °· Will get help right away if you are not doing well or get worse. °Document Released: 10/18/2002 Document Revised: 11/19/2012 Document Reviewed: 03/24/2008 °[employer]® Patient Information ©2014 [employer]. ° °Epinephrine Injection °Epinephrine is a medicine given by injection to temporarily treat an emergency allergic reaction. It is also used to treat severe asthmatic attacks and other lung problems. The medicine helps to enlarge (dilate) the small breathing tubes of the lungs. A life-threatening, sudden allergic reaction that involves the whole body is called anaphylaxis. Because of potential side effects, epinephrine should only be used as directed by your caregiver. °RISKS AND COMPLICATIONS °Possible side effects of epinephrine injections include: °· Chest pain. °· Irregular or rapid heartbeat. °· Shortness of breath. °· Nausea. °· Vomiting. °· Abdominal pain or cramping. °· Sweating. °· Dizziness. °· Weakness. °· Headache. °· Nervousness. °Report all side effects to your  caregiver. °HOW TO GIVE AN EPINEPHRINE INJECTION °Give the epinephrine injection immediately when symptoms of a severe reaction begin. Inject the medicine into the outer thigh or any available, large muscle. Your caregiver can teach you how to do this. You do not need to remove any clothing. After the injection, call your local emergency services (911 in [place] Even if you improve after the injection, you need to be examined at a hospital emergency department. Epinephrine works quickly, but it also wears off quickly. Delayed reactions can occur. A delayed reaction may be as serious and dangerous as the initial reaction. °HOME CARE INSTRUCTIONS °· Make sure you and your family know how to give an epinephrine injection. °· Use epinephrine injections as directed by your caregiver. Do not use this medicine more often or in larger doses than prescribed. °· Always carry your epinephrine injection or anaphylaxis kit with you. This can be lifesaving if you have a severe reaction. °· Store the medicine in a cool, dry place. If the medicine becomes discolored or cloudy, dispose of it properly and replace it with new medicine. °· Check the expiration date on your medicine. It may be unsafe to use medicines past their expiration date. °· Tell your caregiver about any other medicines you are taking. Some medicines can react badly with epinephrine. °· Tell your caregiver about any medical conditions you have, such as   diabetes, high blood pressure (hypertension), heart disease, irregular heartbeats, or if you are pregnant. SEEK IMMEDIATE MEDICAL CARE IF:  You have used an epinephrine injection. Call your local emergency services (911 in [place] Even if you improve after the injection, you need to be examined at a hospital emergency department to make sure your allergic reaction is under control. You will also be monitored for adverse effects from the medicine.  You have chest pain.  You have irregular or fast  heartbeats.  You have shortness of breath.  You have severe headaches.  You have severe nausea, vomiting, or abdominal cramps.  You have severe pain, swelling, or redness in the area where you gave the injection. Document Released: 07/22/2000 Document Revised: 10/17/2011 Document Reviewed: 04/13/2011 [facility] Patient Information 2014 [place], [place].   Angioedema Angioedema (AE) is a sudden swelling of the eyelids, lips, lobes of ears, external genitalia, skin, and other parts of the body. AE can happen by itself. It usually begins during the night and is found on awakening. It can happen with hives and other allergic reactions. Attacks can be mild and annoying, or life-threatening if the air passages swell. AE generally occurs in a short time period (over minutes to hours) and gets better in 24 to 48 hours. It usually does not cause any serious problems.  There are 2 different kinds of AE:   Allergic AE.  Nonallergic AE.  There may be an overreaction or direct stimulation of cells that are a part of the immune system (mast cells).  There may be problems with the release of chemicals made by the body that cause swelling and inflammation (kinins). AE due to kinins can be inherited from parents (hereditary), or it can develop on its own (acquired). Acquired AE either shows up before, or along with, certain diseases or is due to the body's immune system attacking parts of the body's own cells (autoimmune). CAUSES  Allergic  AE due to allergic reactions are caused by something that causes the body to react (trigger). Common triggers include:  Foods.  Medicines.  Latex.  Direct contact with certain fruits, vegetables, or animal saliva.  Insect stings. Nonallergic  Mast cell stimulation may be caused by:  Medicines.  Dyes used in X-rays.  The body's own immune system reactions to parts of the body (autoimmune disease).  Possibly, some virus infections.  AE due to problems  with kinins can be hereditary or acquired. Attacks are triggered by:  Mild injury.  Dental work or any surgery.  Stress.  Sudden changes in temperature.  Exercise.  Medicines.  AE due to problems with kinins can also be due to certain medicines, especially blood pressure medicines like angiotensin-converting enzyme (ACE) inhibitors. African Americans are at nearly 5 times greater risk of developing AE than Caucasians from ACE inhibitors. SYMPTOMS  Allergic symptoms:  Non-itchy swelling of the skin. Often the swelling is on the face and lips, but any area of the skin can swell. Sometimes, the swelling can be painful. If hives are present, there is intense itching.  Breathing problems if the air passages swell. Nonallergic symptoms:  If internal organs are involved, there may be:  Nausea.  Abdominal pain.  Vomiting.  Difficulty swallowing.  Difficulty passing urine.  Breathing problems if the air passages swell. Depending on the cause of AE, episodes may:  Only happen once (if triggers are removed or avoided).  Come back in unpredictable patterns.  Repeat for several years and then gradually fade away. DIAGNOSIS  AE  is diagnosed by:   Asking questions to find out how fast the symptoms began.  Taking a family history.  Physical exam.  Diagnostic tests. Tests could include:  Allergy skin tests to see if the problem is allergic.  Blood tests to diagnose hereditary and some acquired types of AE.  Other tests to see if there is a hidden disease leading to the AE. TREATMENT  Treatment depends on the type and cause (if any) of the AE. Allergic  Allergic types of AE are treated with:  Immediate removal of the trigger or medicine (if any).  Epinephrine injection.  Steroids.  Antihistamines.  Hospitalization for severe attacks. Nonallergic  Mast cell stimulation types of AE are treated with:  Immediate removal of the trigger or medicine (if  any).  Epinephrine injection.  Steroids.  Antihistamines.  Hospitalization for severe attacks.  Hereditary AE is treated with:  Medicines to prevent and treat attacks. There is little response to antihistamines, epinephrine, or steroids.  Preventive medicines before dental work or surgery.  Removing or avoiding medicines that trigger attacks.  Hospitalization for severe attacks.  Acquired AE is treated with:  Treating underlying disease (if any).  Medicines to prevent and treat attacks. HOME CARE INSTRUCTIONS   Always carry your emergency allergy treatment medicines with you.  Wear a medical bracelet.  Avoid known triggers. SEEK MEDICAL CARE IF:   You get repeat attacks.  Your attacks are more frequent or more severe despite preventive measures.  You have hereditary AE and are considering having children. It is important to discuss the risks of passing this on to your children. SEEK IMMEDIATE MEDICAL CARE IF:   You have difficulty breathing.  You have difficulty swallowing.  You experience fainting. This condition should be treated immediately. It can be life-threatening if it involves throat swelling. Document Released: 10/03/2001 Document Revised: 10/17/2011 Document Reviewed: 03/18/2013 [facility] Patient Information 2014 [place], [place].

## 2013-08-09 NOTE — ED Notes (Signed)
Pt states she is having an allergic reaction  Pt states last night around 10 pm she started having swelling to her lips  Pt states she has been on amoxicillin for the past 10 days  No other changes in her routine  Pt denies difficulty breathing or swallowing  Pt has significant swelling to her lips and under her eyes

## 2013-08-09 NOTE — Progress Notes (Signed)
   CARE MANAGEMENT ED NOTE 08/09/2013  Patient:  [facility]   Account Number:  [id number]  Date Initiated:  08/09/2013  Documentation initiated by:  Jackelyn Poling  Subjective/Objective Assessment:   73 yr old female [employer] medicare complete pt with allergic reaction states pcp is cammie fulp     Subjective/Objective Assessment Detail:     Action/Plan:   EPIC updated   Action/Plan Detail:   Anticipated DC Date:       Status Recommendation to Physician:   Result of Recommendation:    Other ED [place]  Other  PCP issues  Outpatient Services - Pt will follow up    Choice offered to / List presented to:            Status of service:  Completed, signed off  ED Comments:   ED Comments Detail:

## 2013-08-09 NOTE — ED Provider Notes (Signed)
CSN: [id number]     Arrival date & time 08/09/13  0535 History   First MD Initiated Contact with Patient 08/09/13 [phone]     Chief Complaint  Patient presents with  . Allergic Reaction   (Consider location/radiation/quality/duration/timing/severity/associated sxs/prior Treatment) HPI 73 year old female presents to emergency department from home with complaint of allergic reaction.  Patient reports around 10 PM last night, she broke out in a rash followed by swelling to her lips.  Patient has been on amoxicillin for last 10 days.  No prior history of allergic reaction.  Patient was eating nuts last night, has never had allergic reaction to nuts.  No other known food allergens, no new soaps, detergents.  Pt is not on ACE-I.     Patient denies any shortness of breath.  She reports she has a sensation that her throat is closing up.  No prior history of same.  She was hoping to make it until her doctor's office opened, but reports symptoms are getting worse.  Past Medical History  Diagnosis Date  . Cerebral aneurysm, nonruptured 2010    not treated due to location-monitor yearly  . Cancer 1994    colon-resection  . Hypertension     well controlled  . GERD (gastroesophageal reflux disease)     infrequent  . Incontinence of urine   . Urgency of urination   . Joint pain, knee    Past Surgical History  Procedure Laterality Date  . Cholecystectomy    . Abdominal hysterectomy    . Laminectomy    . Lysis of adhesion  2000's    with cholecystectomy procedure  . Pilonidal cyst excision    . Aneurysm coiling  2010    cerebral coiling-no deficits  . Pubovaginal sling  09/03/2012    Procedure: Gaynelle Arabian;  Surgeon: Ailene Rud, MD;  Location: [facility];  Service: Urology;  Laterality: N/A;  LYNX SLING   . Cystoscopy  09/03/2012    Procedure: CYSTOSCOPY;  Surgeon: Ailene Rud, MD;  Location: [facility];  Service: Urology;  Laterality: N/A;    Family History  Problem Relation Age of Onset  . Diabetes Other    History  Substance Use Topics  . Smoking status: Never Smoker   . Smokeless tobacco: Not on file  . Alcohol Use: No   OB History   Grav Para Term Preterm Abortions TAB SAB Ect Mult Living                 Review of Systems  See History of Present Illness; otherwise all other systems are reviewed and negative  Allergies  Meperidine and related  Home Medications   Current Outpatient Rx  Name  Route  Sig  Dispense  Refill  . acetaminophen (TYLENOL) 500 MG tablet   Oral   Take 500 mg by mouth every 6 (six) hours as needed.         Marland Kitchen alendronate (FOSAMAX) 70 MG tablet   Oral   Take 70 mg by mouth every 14 (fourteen) days. Take with a full glass of water on an empty stomach.         Marland Kitchen amoxicillin (AMOXIL) 500 MG capsule   Oral   Take 500 mg by mouth 2 (two) times daily.         Marland Kitchen atenolol (TENORMIN) 50 MG tablet   Oral   Take 50 mg by mouth daily.         . calcium-vitamin D (  OSCAL WITH D) 500-200 MG-UNIT per tablet   Oral   Take 1 tablet by mouth daily. 600 mg         . cetirizine (ZYRTEC) 10 MG tablet   Oral   Take 10 mg by mouth daily.         . cholecalciferol (VITAMIN D) 1000 UNITS tablet   Oral   Take 1,000 Units by mouth 2 (two) times daily.         . hydrochlorothiazide (HYDRODIURIL) 25 MG tablet   Oral   Take 25 mg by mouth daily.          BP 151/68  Pulse 66  Temp(Src) 98.3 F (36.8 C) (Oral)  Resp 18  SpO2 95% Physical Exam  Nursing note and vitals reviewed. Constitutional: She is oriented to person, place, and time. She appears well-developed and well-nourished. No distress.  HENT:  Head: Normocephalic and atraumatic.  Right Ear: External ear normal.  Left Ear: External ear normal.  Nose: Nose normal.  Mouth/Throat: Oropharynx is clear and moist.  She with angioedema of upper lip, and lower lip, upper lip greater.  She is noted to have a hoarse voice.   Eyes: Conjunctivae and EOM are normal. Pupils are equal, round, and reactive to light.  Neck: Normal range of motion. Neck supple. No JVD present. No tracheal deviation present. No thyromegaly present.  No stridor, no difficulties swallowing  Cardiovascular: Normal rate, regular rhythm, normal heart sounds and intact distal pulses.  Exam reveals no gallop and no friction rub.   No murmur heard. Pulmonary/Chest: Effort normal and breath sounds normal. No stridor. No respiratory distress. She has no wheezes. She has no rales. She exhibits no tenderness.  Abdominal: Soft. Bowel sounds are normal. She exhibits no distension and no mass. There is no tenderness. There is no rebound and no guarding.  Musculoskeletal: Normal range of motion. She exhibits no edema and no tenderness.  Lymphadenopathy:    She has no cervical adenopathy.  Neurological: She is alert and oriented to person, place, and time. She exhibits normal muscle tone. Coordination normal.  Skin: Skin is warm and dry. Rash noted. No erythema. No pallor.  Patient has scattered maculopapular rash to face, arms.  No hives noted  Psychiatric: She has a normal mood and affect. Her behavior is normal. Judgment and thought content normal.    ED Course  Procedures (including critical care time) Labs Review Labs Reviewed - No data to display Imaging Review No results found.  EKG Interpretation   None       MDM   1. Allergic reaction, initial encounter   2. Angioedema of lips, initial encounter    Reaction.  Given that she is having the sensation that her throat is closing up, we'll add EpiPen to regimen of steroids, Benadryl, Pepcid.  Will observe for 4 hours past.  EpiPen.  Patient will need followup with a allergist.    Kalman Drape, MD 08/09/13 [phone]

## 2013-08-09 NOTE — ED Provider Notes (Signed)
  11:20AM Pt feels better.  Swelling improved during ED course.  No respiratory complaints.  Stable for discharge.  Clinical Impression: 1. Allergic reaction, initial encounter   2. Angioedema of lips, initial encounter       Houston Siren, MD 08/09/13 1212

## 2013-08-09 NOTE — ED Notes (Signed)
Ice water given per pt request. EDP aware and approve.

## 2013-08-09 NOTE — ED Notes (Signed)
Pt ambulated to nearby restroom to void.  

## 2013-08-12 ENCOUNTER — Other Ambulatory Visit: Payer: Self-pay

## 2013-08-12 DIAGNOSIS — Z1231 Encounter for screening mammogram for malignant neoplasm of breast: Secondary | ICD-10-CM

## 2013-09-04 ENCOUNTER — Ambulatory Visit
Admission: RE | Admit: 2013-09-04 | Discharge: 2013-09-04 | Disposition: A | Discharge status: Home / Self Care | Payer: Medicare Other | Source: Ambulatory Visit

## 2013-09-04 DIAGNOSIS — Z1231 Encounter for screening mammogram for malignant neoplasm of breast: Secondary | ICD-10-CM

## 2014-01-12 ENCOUNTER — Emergency Department (HOSPITAL_COMMUNITY)
Admission: EM | Admit: 2014-01-12 | Discharge: 2014-01-12 | Disposition: A | Discharge status: Home / Self Care | Payer: Medicare Other | Attending: Emergency Medicine | Admitting: Emergency Medicine

## 2014-01-12 ENCOUNTER — Emergency Department (HOSPITAL_COMMUNITY): Payer: Medicare Other

## 2014-01-12 ENCOUNTER — Encounter (HOSPITAL_COMMUNITY): Payer: Self-pay | Admitting: Emergency Medicine

## 2014-01-12 DIAGNOSIS — I10 Essential (primary) hypertension: Secondary | ICD-10-CM | POA: Insufficient documentation

## 2014-01-12 DIAGNOSIS — Z79899 Other long term (current) drug therapy: Secondary | ICD-10-CM | POA: Insufficient documentation

## 2014-01-12 DIAGNOSIS — Z85038 Personal history of other malignant neoplasm of large intestine: Secondary | ICD-10-CM | POA: Insufficient documentation

## 2014-01-12 DIAGNOSIS — W1809XA Striking against other object with subsequent fall, initial encounter: Secondary | ICD-10-CM | POA: Insufficient documentation

## 2014-01-12 DIAGNOSIS — K219 Gastro-esophageal reflux disease without esophagitis: Secondary | ICD-10-CM | POA: Insufficient documentation

## 2014-01-12 DIAGNOSIS — S79919A Unspecified injury of unspecified hip, initial encounter: Secondary | ICD-10-CM | POA: Insufficient documentation

## 2014-01-12 DIAGNOSIS — S79929A Unspecified injury of unspecified thigh, initial encounter: Secondary | ICD-10-CM

## 2014-01-12 DIAGNOSIS — R52 Pain, unspecified: Secondary | ICD-10-CM

## 2014-01-12 DIAGNOSIS — Z792 Long term (current) use of antibiotics: Secondary | ICD-10-CM | POA: Insufficient documentation

## 2014-01-12 DIAGNOSIS — Z880 Allergy status to penicillin: Secondary | ICD-10-CM | POA: Insufficient documentation

## 2014-01-12 DIAGNOSIS — Y939 Activity, unspecified: Secondary | ICD-10-CM | POA: Insufficient documentation

## 2014-01-12 DIAGNOSIS — Y929 Unspecified place or not applicable: Secondary | ICD-10-CM | POA: Insufficient documentation

## 2014-01-12 DIAGNOSIS — IMO0002 Reserved for concepts with insufficient information to code with codable children: Secondary | ICD-10-CM | POA: Insufficient documentation

## 2014-01-12 DIAGNOSIS — W19XXXA Unspecified fall, initial encounter: Secondary | ICD-10-CM

## 2014-01-12 LAB — URINALYSIS, ROUTINE W REFLEX MICROSCOPIC
Bilirubin Urine: NEGATIVE
GLUCOSE, UA: NEGATIVE mg/dL
Ketones, ur: NEGATIVE mg/dL
LEUKOCYTES UA: NEGATIVE
NITRITE: NEGATIVE
PH: 5.5 (ref 5.0–8.0)
Protein, ur: NEGATIVE mg/dL
SPECIFIC GRAVITY, URINE: 1.018 (ref 1.005–1.030)
Urobilinogen, UA: 0.2 mg/dL (ref 0.0–1.0)

## 2014-01-12 LAB — URINE MICROSCOPIC-ADD ON

## 2014-01-12 IMAGING — CR DG LUMBAR SPINE COMPLETE 4+V
5 series · 5 of 5 positions shown · non-contrast
Comparison: None.

CLINICAL DATA: Pain post trauma

EXAM:
LUMBAR SPINE - COMPLETE 4+ VIEW

[t lumbar spine ap]
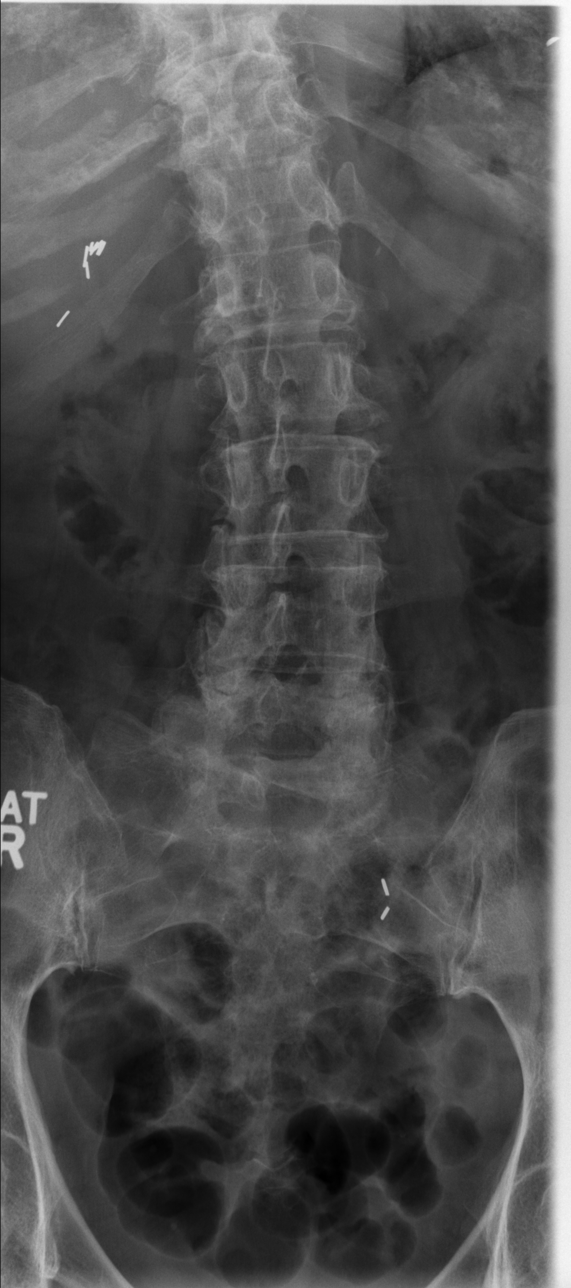

[t lumbar spine obl (1 of 2)]
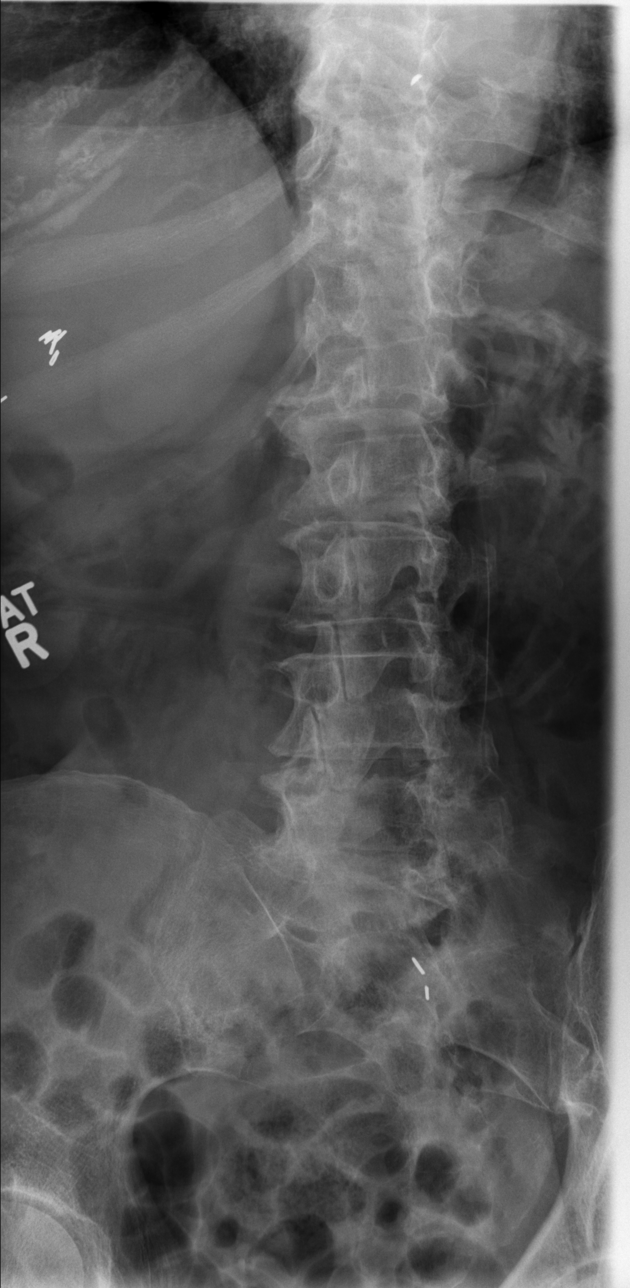

[t lumbar spine obl (2 of 2)]
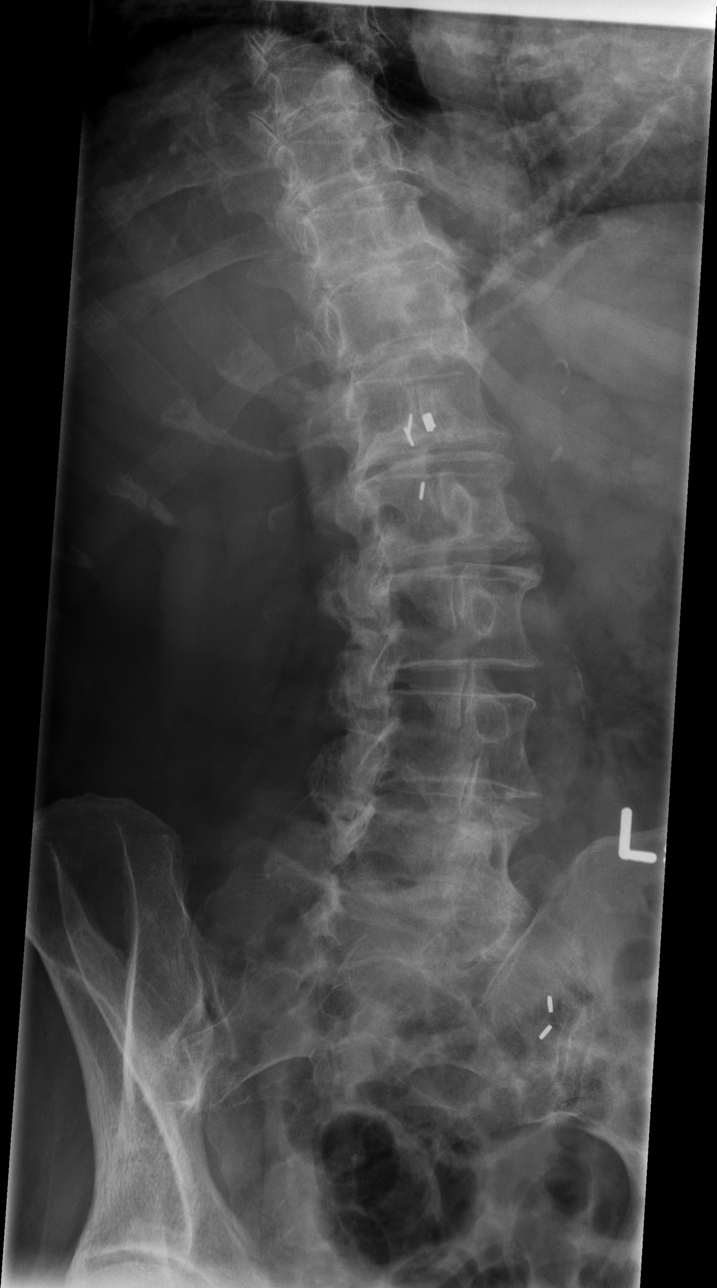

[t lumbar spine lat]
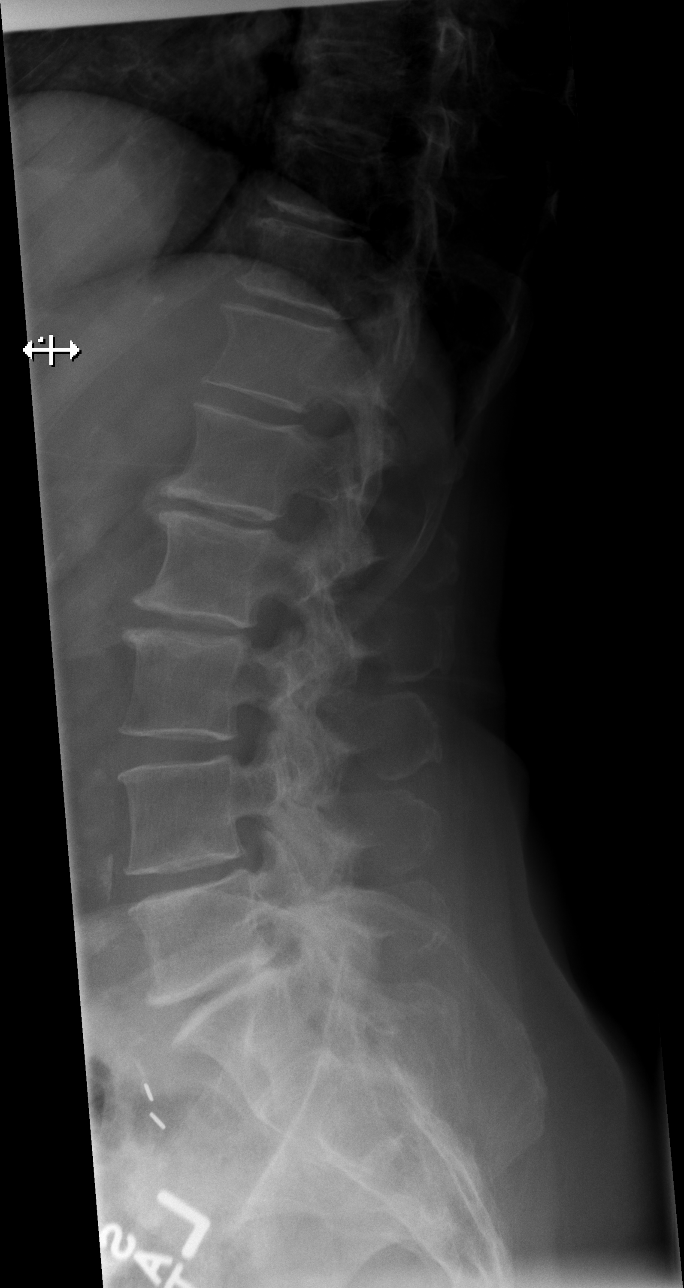

[t lumbar l-5 s-1 spot]
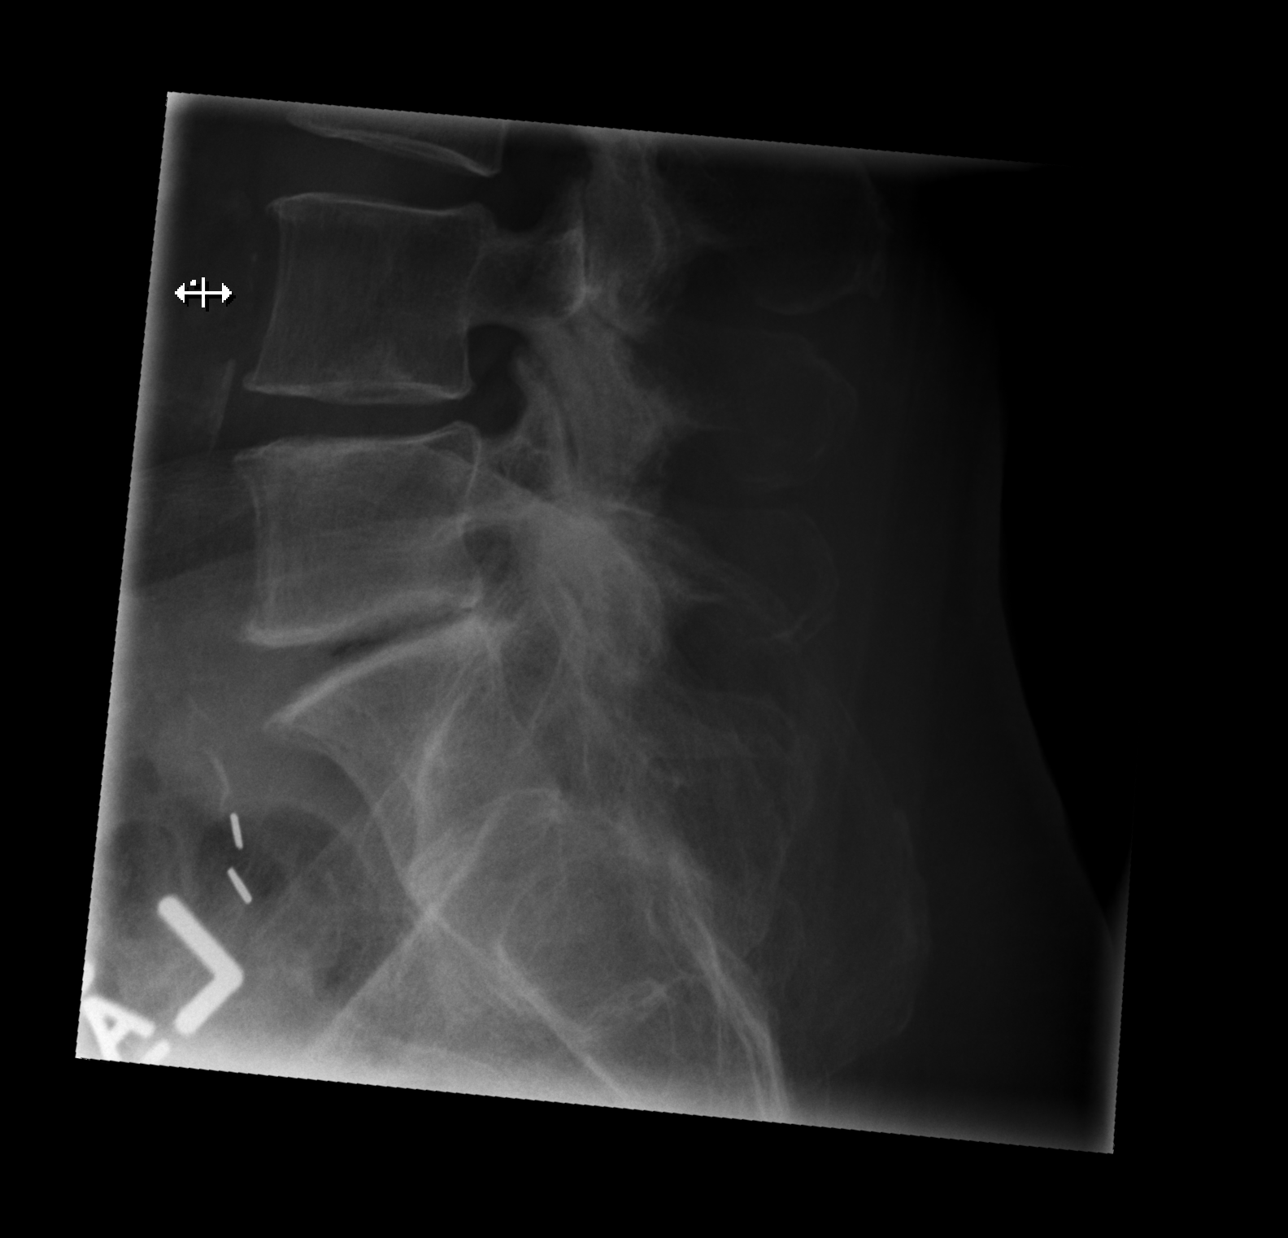

[5 of 5 positions shown; findings below may reference images not displayed]

FINDINGS: Frontal, lateral, spot lumbosacral lateral, and bilateral oblique
views were obtained. There are 5 non-rib-bearing lumbar type
vertebral bodies. There is lumbar levoscoliosis. There is no
fracture or spondylolisthesis. There is moderately severe disc space
narrowing at L1-2, L2-3, and L5-S1. There is moderate narrowing at
L3-4. There is facet osteoarthritic change at L4-5 and L5-S1
bilaterally. There is also osteoarthritic change on the right at
L3-4.
IMPRESSION: Multilevel osteoarthritic change. Scoliosis. No fracture or
spondylolisthesis.

## 2014-01-12 IMAGING — CR DG PELVIS 1-2V
1 series · 1 of 1 positions shown · non-contrast
Comparison: None.

CLINICAL DATA: Fall, pain

EXAM:
PELVIS - 1-2 VIEW

[t pelvis ap]
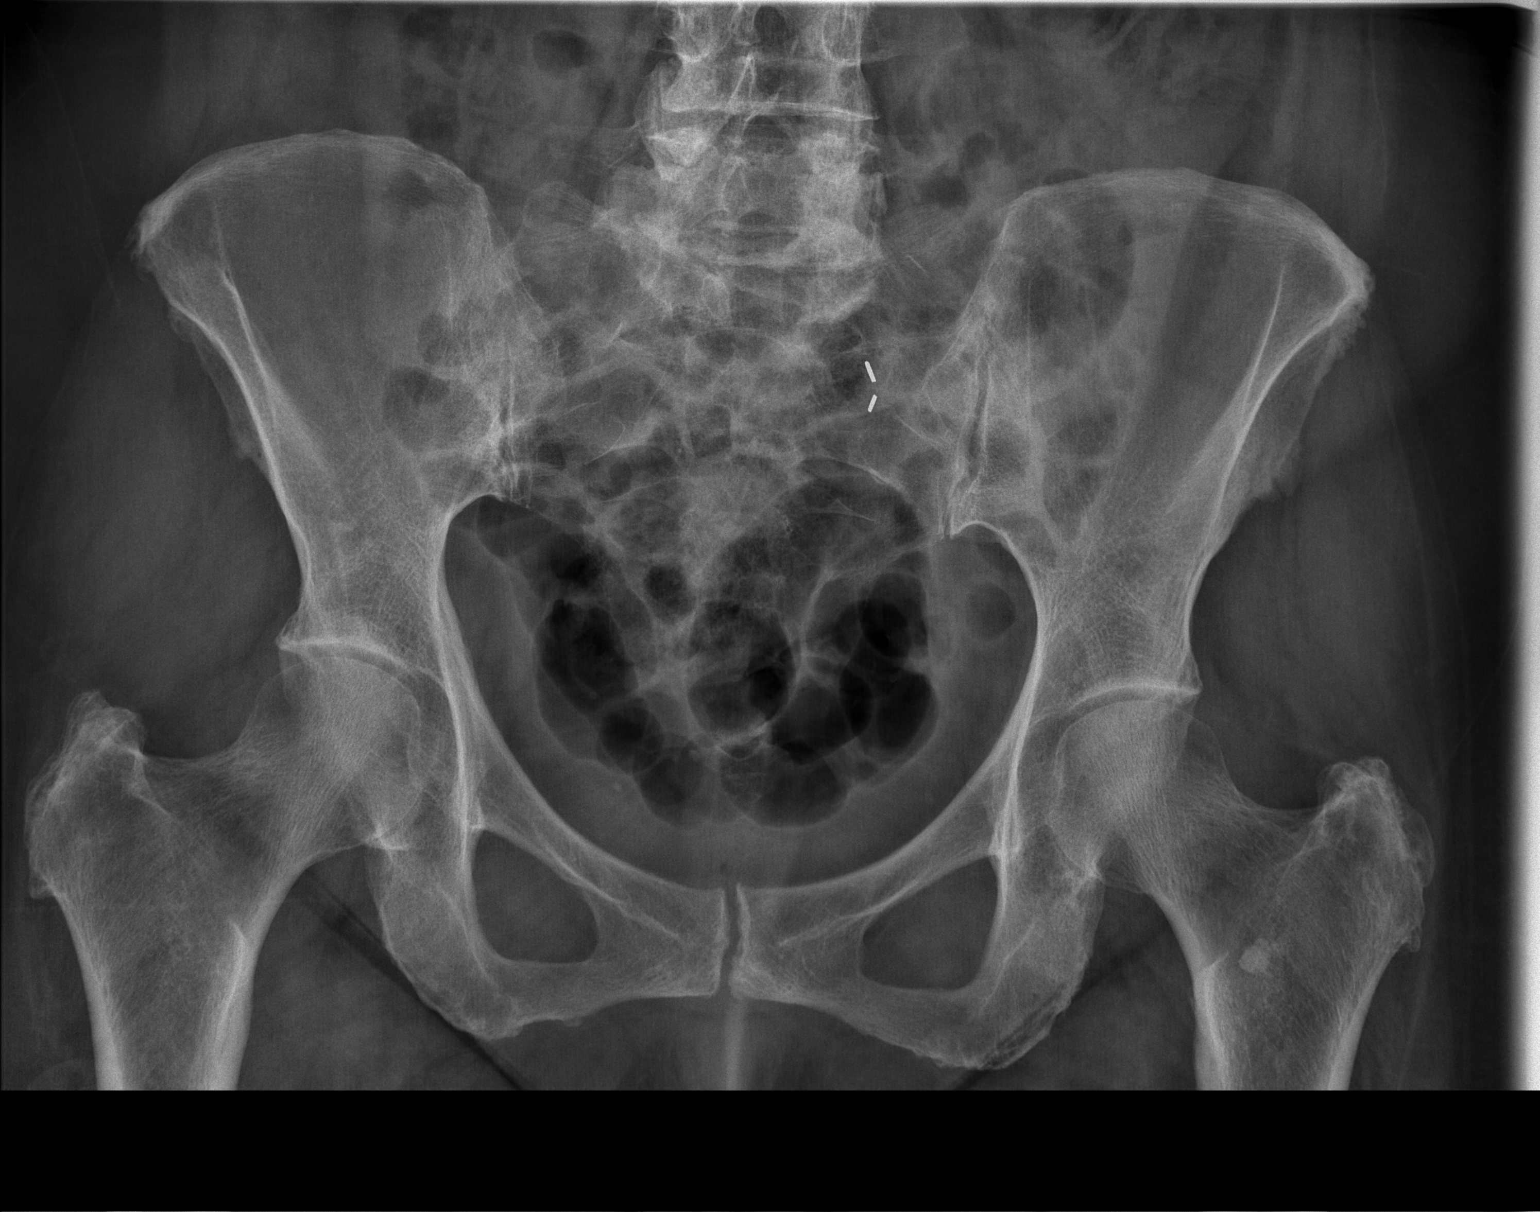

[1 of 1 positions shown; findings below may reference images not displayed]

FINDINGS: There is no evidence of pelvic fracture or diastasis. No other
pelvic bone lesions are seen.
IMPRESSION: Negative.

## 2014-01-12 MED ORDER — IBUPROFEN 800 MG PO TABS: 800.0000 mg | ORAL_TABLET | Freq: Three times a day (TID) | ORAL | Status: DC

## 2014-01-12 MED ORDER — HYDROCODONE-ACETAMINOPHEN 5-325 MG PO TABS: 1.0000 | ORAL_TABLET | ORAL | Status: DC | PRN

## 2014-01-12 MED ORDER — HYDROCODONE-ACETAMINOPHEN 5-325 MG PO TABS
1.0000 | ORAL_TABLET | Freq: Once | ORAL | Status: AC
  Administered 2014-01-12: 1 via ORAL
  Filled 2014-01-12: qty 1

## 2014-01-12 NOTE — ED Notes (Signed)
Pt assisted to the restroom with steady in attempt to collect urine sample.

## 2014-01-12 NOTE — Discharge Instructions (Signed)
Cryotherapy Cryotherapy means treatment with cold. Ice or gel packs can be used to reduce both pain and swelling. Ice is the most helpful within the first 24 to 48 hours after an injury or flareup from overusing a muscle or joint. Sprains, strains, spasms, burning pain, shooting pain, and aches can all be eased with ice. Ice can also be used when recovering from surgery. Ice is effective, has very few side effects, and is safe for most people to use. PRECAUTIONS  Ice is not a safe treatment option for people with:  Raynaud's phenomenon. This is a condition affecting small blood vessels in the extremities. Exposure to cold may cause your problems to return.  Cold hypersensitivity. There are many forms of cold hypersensitivity, including:  Cold urticaria. Red, itchy hives appear on the skin when the tissues begin to warm after being iced.  Cold erythema. This is a red, itchy rash caused by exposure to cold.  Cold hemoglobinuria. Red blood cells break down when the tissues begin to warm after being iced. The hemoglobin that carry oxygen are passed into the urine because they cannot combine with blood proteins fast enough.  Numbness or altered sensitivity in the area being iced. If you have any of the following conditions, do not use ice until you have discussed cryotherapy with your caregiver:  Heart conditions, such as arrhythmia, angina, or chronic heart disease.  High blood pressure.  Healing wounds or open skin in the area being iced.  Current infections.  Rheumatoid arthritis.  Poor circulation.  Diabetes. Ice slows the blood flow in the region it is applied. This is beneficial when trying to stop inflamed tissues from spreading irritating chemicals to surrounding tissues. However, if you expose your skin to cold temperatures for too long or without the proper protection, you can damage your skin or nerves. Watch for signs of skin damage due to cold. HOME CARE INSTRUCTIONS Follow  these tips to use ice and cold packs safely.  Place a dry or damp towel between the ice and skin. A damp towel will cool the skin more quickly, so you may need to shorten the time that the ice is used.  For a more rapid response, add gentle compression to the ice.  Ice for no more than 10 to 20 minutes at a time. The bonier the area you are icing, the less time it will take to get the benefits of ice.  Check your skin after 5 minutes to make sure there are no signs of a poor response to cold or skin damage.  Rest 20 minutes or more in between uses.  Once your skin is numb, you can end your treatment. You can test numbness by very lightly touching your skin. The touch should be so light that you do not see the skin dimple from the pressure of your fingertip. When using ice, most people will feel these normal sensations in this order: cold, burning, aching, and numbness.  Do not use ice on someone who cannot communicate their responses to pain, such as small children or people with dementia. HOW TO MAKE AN ICE PACK Ice packs are the most common way to use ice therapy. Other methods include ice massage, ice baths, and cryo-sprays. Muscle creams that cause a cold, tingly feeling do not offer the same benefits that ice offers and should not be used as a substitute unless recommended by your caregiver. To make an ice pack, do one of the following:  Place crushed ice or  a bag of frozen vegetables in a sealable plastic bag. Squeeze out the excess air. Place this bag inside another plastic bag. Slide the bag into a pillowcase or place a damp towel between your skin and the bag.  Mix 3 parts water with 1 part rubbing alcohol. Freeze the mixture in a sealable plastic bag. When you remove the mixture from the freezer, it will be slushy. Squeeze out the excess air. Place this bag inside another plastic bag. Slide the bag into a pillowcase or place a damp towel between your skin and the bag. SEEK MEDICAL  CARE IF:  You develop white spots on your skin. This may give the skin a blotchy (mottled) appearance.  Your skin turns blue or pale.  Your skin becomes waxy or hard.  Your swelling gets worse. MAKE SURE YOU:   Understand these instructions.  Will watch your condition.  Will get help right away if you are not doing well or get worse. Document Released: 03/21/2011 Document Revised: 10/17/2011 Document Reviewed: 03/21/2011 [facility] Patient Information 2014 [place], [place].  Musculoskeletal Pain Musculoskeletal pain is muscle and boney aches and pains. These pains can occur in any part of the body. Your caregiver may treat you without knowing the cause of the pain. They may treat you if blood or urine tests, X-rays, and other tests were normal.  CAUSES There is often not a definite cause or reason for these pains. These pains may be caused by a type of germ (virus). The discomfort may also come from overuse. Overuse includes working out too hard when your body is not fit. Boney aches also come from weather changes. Bone is sensitive to atmospheric pressure changes. HOME CARE INSTRUCTIONS   Ask when your test results will be ready. Make sure you get your test results.  Only take over-the-counter or prescription medicines for pain, discomfort, or fever as directed by your caregiver. If you were given medications for your condition, do not drive, operate machinery or power tools, or sign legal documents for 24 hours. Do not drink alcohol. Do not take sleeping pills or other medications that may interfere with treatment.  Continue all activities unless the activities cause more pain. When the pain lessens, slowly resume normal activities. Gradually increase the intensity and duration of the activities or exercise.  During periods of severe pain, bed rest may be helpful. Lay or sit in any position that is comfortable.  Putting ice on the injured area.  Put ice in a bag.  Place a towel  between your skin and the bag.  Leave the ice on for 15 to 20 minutes, 3 to 4 times a day.  Follow up with your caregiver for continued problems and no reason can be found for the pain. If the pain becomes worse or does not go away, it may be necessary to repeat tests or do additional testing. Your caregiver may need to look further for a possible cause. SEEK IMMEDIATE MEDICAL CARE IF:  You have pain that is getting worse and is not relieved by medications.  You develop chest pain that is associated with shortness or breath, sweating, feeling sick to your stomach (nauseous), or throw up (vomit).  Your pain becomes localized to the abdomen.  You develop any new symptoms that seem different or that concern you. MAKE SURE YOU:   Understand these instructions.  Will watch your condition.  Will get help right away if you are not doing well or get worse. Document Released: 07/25/2005 Document  Revised: 10/17/2011 Document Reviewed: 03/29/2013 [facility] Patient Information 2014 [place], [place].

## 2014-01-12 NOTE — ED Provider Notes (Signed)
Medical screening examination/treatment/procedure(s) were conducted as a shared visit with non-physician practitioner(s) and myself.  I personally evaluated the patient during the encounter.  Pt had a mechanical fall.  Landed on left side but states pain is in her right back and pelvis now.  Lumbar spine and pelvic xrays ordered.  Xrays are normal.  Pt is amulatory.  C/w soft tissue injury.  Doubt occult fracture  Dorie Rank, MD 01/12/14 1840

## 2014-01-12 NOTE — ED Provider Notes (Signed)
CSN: [id number]     Arrival date & time 01/12/14  1557 History   First MD Initiated Contact with Patient 01/12/14 1613     Chief Complaint  Patient presents with  . Back Pain  . Hip Pain     (Consider location/radiation/quality/duration/timing/severity/associated sxs/prior Treatment) Patient is a 73 y.o. female presenting with hip pain and flank pain. The history is provided by the patient. No language interpreter was used.  Hip Pain Pertinent negatives include no abdominal pain, chest pain, fever, headaches or neck pain.  Flank Pain This is a new problem. The current episode started today. The problem occurs constantly. The problem has been gradually worsening. Pertinent negatives include no abdominal pain, chest pain, fever, headaches or neck pain. Associated symptoms comments: She fell today after slipping on gravel surface, landing on her left hip. She was able to ambulate after the fall. Since then, she reports increasing pain in the right flank, no left hip pain, that radiates to the right pelvis. No abdominal pain. She denies hitting her head, current headache or neck pain. .    Past Medical History  Diagnosis Date  . Cerebral aneurysm, nonruptured 2010    not treated due to location-monitor yearly  . Cancer 1994    colon-resection  . Hypertension     well controlled  . GERD (gastroesophageal reflux disease)     infrequent  . Incontinence of urine   . Urgency of urination   . Joint pain, knee    Past Surgical History  Procedure Laterality Date  . Cholecystectomy    . Abdominal hysterectomy    . Laminectomy    . Lysis of adhesion  2000's    with cholecystectomy procedure  . Pilonidal cyst excision    . Aneurysm coiling  2010    cerebral coiling-no deficits  . Pubovaginal sling  09/03/2012    Procedure: Gaynelle Arabian;  Surgeon: Ailene Rud, MD;  Location: [facility];  Service: Urology;  Laterality: N/A;  LYNX SLING   . Cystoscopy  09/03/2012     Procedure: CYSTOSCOPY;  Surgeon: Ailene Rud, MD;  Location: [facility];  Service: Urology;  Laterality: N/A;   Family History  Problem Relation Age of Onset  . Diabetes Other    History  Substance Use Topics  . Smoking status: Never Smoker   . Smokeless tobacco: Not on file  . Alcohol Use: No   OB History   Grav Para Term Preterm Abortions TAB SAB Ect Mult Living                 Review of Systems  Constitutional: Negative for fever.  Respiratory: Negative for shortness of breath.   Cardiovascular: Negative for chest pain.  Gastrointestinal: Negative for abdominal pain.  Genitourinary: Positive for flank pain. Negative for dysuria.  Musculoskeletal: Positive for back pain. Negative for neck pain.  Skin: Negative for wound.  Neurological: Negative for headaches.      Allergies  Amoxicillin and Meperidine and related  Home Medications   Prior to Admission medications   Medication Sig Start Date End Date Taking? Authorizing Provider  acetaminophen (TYLENOL) 500 MG tablet Take 500 mg by mouth every 6 (six) hours as needed.    Historical Provider, MD  alendronate (FOSAMAX) 70 MG tablet Take 70 mg by mouth every 14 (fourteen) days. Take with a full glass of water on an empty stomach.    Historical Provider, MD  amoxicillin (AMOXIL) 500 MG capsule Take 500  mg by mouth 2 (two) times daily. 07/29/13   Historical Provider, MD  atenolol (TENORMIN) 50 MG tablet Take 50 mg by mouth daily.    Historical Provider, MD  calcium-vitamin D (OSCAL WITH D) 500-200 MG-UNIT per tablet Take 1 tablet by mouth daily. 600 mg    Historical Provider, MD  cetirizine (ZYRTEC) 10 MG tablet Take 10 mg by mouth daily.    Historical Provider, MD  cholecalciferol (VITAMIN D) 1000 UNITS tablet Take 1,000 Units by mouth 2 (two) times daily.    Historical Provider, MD  diphenhydrAMINE (BENADRYL) 25 MG tablet Take 1 tablet (25 mg total) by mouth every 6 (six) hours. 08/09/13   Kalman Drape, MD  EPINEPHrine (EPIPEN) 0.3 mg/0.3 mL SOAJ injection Inject 0.3 mLs (0.3 mg total) into the muscle once. 08/09/13   Kalman Drape, MD  famotidine (PEPCID) 20 MG tablet Take 1 tablet (20 mg total) by mouth 2 (two) times daily. 08/09/13   Kalman Drape, MD  hydrochlorothiazide (HYDRODIURIL) 25 MG tablet Take 25 mg by mouth daily.    Historical Provider, MD  predniSONE (DELTASONE) 20 MG tablet Take 3 tablets (60 mg total) by mouth daily. 08/09/13   Kalman Drape, MD   BP 165/69  Pulse 60  Temp(Src) 98.1 F (36.7 C) (Oral)  Resp 18  SpO2 95% Physical Exam  Constitutional: She is oriented to person, place, and time. She appears well-developed and well-nourished.  Neck: Normal range of motion.  Pulmonary/Chest: Effort normal.  Abdominal: Soft. There is no tenderness. There is no rebound and no guarding.  Genitourinary:  Right CVA tenderness.   Musculoskeletal:  Full ROM all joints of extremities. No left hip tenderness. Right hip minimally tender without loss of strength or range of motion. No midline lumbar or thoracic tenderness.   Neurological: She is alert and oriented to person, place, and time.  Skin: Skin is warm and dry.    ED Course  Procedures (including critical care time) Labs Review Labs Reviewed  URINALYSIS, ROUTINE W REFLEX MICROSCOPIC   Results for orders placed during the hospital encounter of 01/12/14  URINALYSIS, ROUTINE W REFLEX MICROSCOPIC      Result Value Ref Range   Color, Urine YELLOW  YELLOW   APPearance CLEAR  CLEAR   Specific Gravity, Urine 1.018  1.005 - 1.030   pH 5.5  5.0 - 8.0   Glucose, UA NEGATIVE  NEGATIVE mg/dL   Hgb urine dipstick TRACE (*) NEGATIVE   Bilirubin Urine NEGATIVE  NEGATIVE   Ketones, ur NEGATIVE  NEGATIVE mg/dL   Protein, ur NEGATIVE  NEGATIVE mg/dL   Urobilinogen, UA 0.2  0.0 - 1.0 mg/dL   Nitrite NEGATIVE  NEGATIVE   Leukocytes, UA NEGATIVE  NEGATIVE  URINE MICROSCOPIC-ADD ON      Result Value Ref Range   WBC, UA 0-2  <3  WBC/hpf   Dg Lumbar Spine Complete  01/12/2014   CLINICAL DATA:  Pain post trauma  EXAM: LUMBAR SPINE - COMPLETE 4+ VIEW  COMPARISON:  None.  FINDINGS: Frontal, lateral, spot lumbosacral lateral, and bilateral oblique views were obtained. There are 5 non-rib-bearing lumbar type vertebral bodies. There is lumbar levoscoliosis. There is no fracture or spondylolisthesis. There is moderately severe disc space narrowing at L1-2, L2-3, and L5-S1. There is moderate narrowing at L3-4. There is facet osteoarthritic change at L4-5 and L5-S1 bilaterally. There is also osteoarthritic change on the right at L3-4.  IMPRESSION: Multilevel osteoarthritic change. Scoliosis. No fracture or spondylolisthesis.  Electronically Signed   By: Lowella Grip M.D.   On: 01/12/2014 17:34   Dg Pelvis 1-2 Views  01/12/2014   CLINICAL DATA:  Fall, pain  EXAM: PELVIS - 1-2 VIEW  COMPARISON:  None.  FINDINGS: There is no evidence of pelvic fracture or diastasis. No other pelvic bone lesions are seen.  IMPRESSION: Negative.   Electronically Signed   By: Rolla Flatten M.D.   On: 01/12/2014 17:34    Imaging Review No results found.   EKG Interpretation None      MDM   Final diagnoses:  None    1. Fall 2. Right side pain  Imaging all negative. There is trace blood in the urine which is consistent with patient's stated history of same, followed by urology. She is ambulatory. Suspect muscular soreness. Dr. Tomi Bamberger has seen and co-evaluated patient. Stable for discharge home.     Dewaine Oats, PA-C 01/12/14 [phone]

## 2014-01-12 NOTE — ED Notes (Signed)
Pt presents to ed with c/o fall and back  And hip pain. Pt sts she fell this morning hitting her left side and has since developed pain in her right  lower back that radiates to her right hip. Pt was ambulatory after the fall.

## 2014-04-22 ENCOUNTER — Emergency Department (HOSPITAL_COMMUNITY)
Admission: EM | Admit: 2014-04-22 | Discharge: 2014-04-23 | Disposition: A | Discharge status: Home / Self Care | Payer: Medicare Other | Attending: Emergency Medicine | Admitting: Emergency Medicine

## 2014-04-22 ENCOUNTER — Encounter (HOSPITAL_COMMUNITY): Payer: Self-pay | Admitting: Emergency Medicine

## 2014-04-22 DIAGNOSIS — T6391XA Toxic effect of contact with unspecified venomous animal, accidental (unintentional), initial encounter: Secondary | ICD-10-CM | POA: Diagnosis not present

## 2014-04-22 DIAGNOSIS — M25569 Pain in unspecified knee: Secondary | ICD-10-CM | POA: Insufficient documentation

## 2014-04-22 DIAGNOSIS — S80262A Insect bite (nonvenomous), left knee, initial encounter: Secondary | ICD-10-CM

## 2014-04-22 DIAGNOSIS — Z880 Allergy status to penicillin: Secondary | ICD-10-CM | POA: Insufficient documentation

## 2014-04-22 DIAGNOSIS — IMO0002 Reserved for concepts with insufficient information to code with codable children: Secondary | ICD-10-CM | POA: Diagnosis not present

## 2014-04-22 DIAGNOSIS — Z79899 Other long term (current) drug therapy: Secondary | ICD-10-CM | POA: Insufficient documentation

## 2014-04-22 DIAGNOSIS — W57XXXA Bitten or stung by nonvenomous insect and other nonvenomous arthropods, initial encounter: Secondary | ICD-10-CM

## 2014-04-22 DIAGNOSIS — T63461A Toxic effect of venom of wasps, accidental (unintentional), initial encounter: Secondary | ICD-10-CM | POA: Diagnosis not present

## 2014-04-22 DIAGNOSIS — Y9289 Other specified places as the place of occurrence of the external cause: Secondary | ICD-10-CM | POA: Insufficient documentation

## 2014-04-22 DIAGNOSIS — K219 Gastro-esophageal reflux disease without esophagitis: Secondary | ICD-10-CM | POA: Insufficient documentation

## 2014-04-22 DIAGNOSIS — Z85038 Personal history of other malignant neoplasm of large intestine: Secondary | ICD-10-CM | POA: Insufficient documentation

## 2014-04-22 DIAGNOSIS — Y93H2 Activity, gardening and landscaping: Secondary | ICD-10-CM | POA: Diagnosis not present

## 2014-04-22 DIAGNOSIS — I10 Essential (primary) hypertension: Secondary | ICD-10-CM | POA: Insufficient documentation

## 2014-04-22 MED ORDER — METHYLPREDNISOLONE SODIUM SUCC 125 MG IJ SOLR
125.0000 mg | Freq: Once | INTRAMUSCULAR | Status: AC
  Administered 2014-04-22: 125 mg via INTRAVENOUS
  Filled 2014-04-22: qty 2

## 2014-04-22 MED ORDER — FAMOTIDINE IN NACL 20-0.9 MG/50ML-% IV SOLN
20.0000 mg | Freq: Once | INTRAVENOUS | Status: AC
  Administered 2014-04-22: 20 mg via INTRAVENOUS
  Filled 2014-04-22: qty 50

## 2014-04-22 MED ORDER — ACETAMINOPHEN 325 MG PO TABS
650.0000 mg | ORAL_TABLET | Freq: Once | ORAL | Status: AC
  Administered 2014-04-22: 650 mg via ORAL
  Filled 2014-04-22: qty 2

## 2014-04-22 MED ORDER — DIPHENHYDRAMINE HCL 50 MG/ML IJ SOLN
50.0000 mg | Freq: Once | INTRAMUSCULAR | Status: AC
  Administered 2014-04-22: 50 mg via INTRAVENOUS
  Filled 2014-04-22: qty 1

## 2014-04-22 NOTE — ED Provider Notes (Signed)
Medical screening examination/treatment/procedure(s) were conducted as a shared visit with non-physician practitioner(s) and myself.  I personally evaluated the patient during the encounter.   EKG Interpretation None     Patient has a large localized reaction to a insect sting. At this point and there is no evidence of systemic reaction. She will be treated with Benadryl prednisone and Pepcid. Patient is counseled to return if there should be worsening or changing of her symptoms.  Charlesetta Shanks, MD 04/22/14 [phone]

## 2014-04-22 NOTE — ED Notes (Signed)
Pt states that approx. 8 hours ago she got stung by a wasp on the medial aspect of her thigh. Area is now red, swollen and cellulitic. Previous allergy. Did not take epi pen. Does reports rough feeling in her throat. O2 stable. Alert and oriented.

## 2014-04-22 NOTE — ED Provider Notes (Signed)
CSN: [id number]     Arrival date & time 04/22/14  2100 History   First MD Initiated Contact with Patient 04/22/14 2142     Chief Complaint  Patient presents with  . Allergic Reaction   Patient is a 73 y.o. female presenting with allergic reaction.  Allergic Reaction   Patient is a 73 y.o. Female who presents to the ED with allergic reaction after insect sting to the left knee.  Patient states that she was out gardening and was stung by a bee on her left knee.  Patient states that she developed some minor redness and swelling initially and she took 25 mg benadryl.  The redness and warmth and pain increased throughout the same day.  Patient states that she took another 25 mg benadryl at 6 pm and still had no relief.  Patient states that she has never had a reaction to bees in the past.  She denies fever, chills, nausea, vomiting, chest pain, shortness of breath, abdominal pain, facial swelling, or rash anywhere outside of the localized reaction.  Patient is otherwise healthy. She has been able to ambulate without assistance.  Past Medical History  Diagnosis Date  . Cerebral aneurysm, nonruptured 2010    not treated due to location-monitor yearly  . Cancer 1994    colon-resection  . Hypertension     well controlled  . GERD (gastroesophageal reflux disease)     infrequent  . Incontinence of urine   . Urgency of urination   . Joint pain, knee    Past Surgical History  Procedure Laterality Date  . Cholecystectomy    . Abdominal hysterectomy    . Laminectomy    . Lysis of adhesion  2000's    with cholecystectomy procedure  . Pilonidal cyst excision    . Aneurysm coiling  2010    cerebral coiling-no deficits  . Pubovaginal sling  09/03/2012    Procedure: Gaynelle Arabian;  Surgeon: Ailene Rud, MD;  Location: [facility];  Service: Urology;  Laterality: N/A;  LYNX SLING   . Cystoscopy  09/03/2012    Procedure: CYSTOSCOPY;  Surgeon: Ailene Rud, MD;   Location: [facility];  Service: Urology;  Laterality: N/A;   Family History  Problem Relation Age of Onset  . Diabetes Other    History  Substance Use Topics  . Smoking status: Never Smoker   . Smokeless tobacco: Not on file  . Alcohol Use: No   OB History   Grav Para Term Preterm Abortions TAB SAB Ect Mult Living                 Review of Systems See HPI   Allergies  Amoxicillin and Meperidine and related  Home Medications   Prior to Admission medications   Medication Sig Start Date End Date Taking? Authorizing Provider  acetaminophen (TYLENOL) 500 MG tablet Take 500 mg by mouth every 6 (six) hours as needed for mild pain.    Yes Historical Provider, MD  alendronate (FOSAMAX) 70 MG tablet Take 70 mg by mouth once a week. Take with a full glass of water on an empty stomach.   Yes Historical Provider, MD  atenolol (TENORMIN) 50 MG tablet Take 50 mg by mouth daily.   Yes Historical Provider, MD  calcium-vitamin D (OSCAL WITH D) 500-200 MG-UNIT per tablet Take 1 tablet by mouth daily.    Yes Historical Provider, MD  cetirizine (ZYRTEC) 10 MG tablet Take 10 mg by mouth daily.  Yes Historical Provider, MD  cholecalciferol (VITAMIN D) 1000 UNITS tablet Take 2,000 Units by mouth daily.    Yes Historical Provider, MD  diphenhydrAMINE (BENADRYL) 25 MG tablet Take 25 mg by mouth every 4 (four) hours as needed for allergies.   Yes Historical Provider, MD  EPINEPHrine (EPIPEN) 0.3 mg/0.3 mL SOAJ injection Inject 0.3 mLs (0.3 mg total) into the muscle once. 08/09/13  Yes Kalman Drape, MD  guaiFENesin (MUCINEX) 600 MG 12 hr tablet Take 600 mg by mouth daily.   Yes Historical Provider, MD  hydrochlorothiazide (HYDRODIURIL) 25 MG tablet Take 25 mg by mouth daily.   Yes Historical Provider, MD  diphenhydrAMINE (BENADRYL) 25 MG tablet Take 1 tablet (25 mg total) by mouth every 6 (six) hours. 04/23/14   Leron Stoffers A Forcucci, PA-C  famotidine (PEPCID) 20 MG tablet Take 1 tablet (20 mg  total) by mouth 2 (two) times daily. 04/23/14   Maliki Gignac A Forcucci, PA-C  predniSONE (DELTASONE) 10 MG tablet Take 4 tablets (40 mg total) by mouth daily with breakfast. 04/23/14 04/28/14  Rosmary Dionisio A Forcucci, PA-C   BP 123/68  Pulse 61  Temp(Src) 98 F (36.7 C) (Oral)  Resp 16  SpO2 95% Physical Exam  Nursing note and vitals reviewed. Constitutional: She is oriented to person, place, and time. She appears well-developed and well-nourished. No distress.  HENT:  Head: Normocephalic and atraumatic.  Mouth/Throat: Oropharynx is clear and moist. No oropharyngeal exudate.  Eyes: Conjunctivae and EOM are normal. Pupils are equal, round, and reactive to light. No scleral icterus.  Neck: Normal range of motion. Neck supple. No JVD present. No thyromegaly present.  Cardiovascular: Normal rate, regular rhythm and intact distal pulses.  Exam reveals no gallop and no friction rub.   No murmur heard. Pulmonary/Chest: Effort normal and breath sounds normal. No respiratory distress. She has no wheezes. She has no rales. She exhibits no tenderness.  Abdominal: Soft. Bowel sounds are normal. She exhibits no distension and no mass. There is no tenderness. There is no rebound and no guarding.  Musculoskeletal: Normal range of motion.       Left knee: She exhibits swelling and erythema. She exhibits normal range of motion, no effusion, no ecchymosis, no deformity, no laceration, normal alignment, no LCL laxity, normal patellar mobility, no bony tenderness, normal meniscus and no MCL laxity. No tenderness found. No medial joint line, no lateral joint line, no MCL, no LCL and no patellar tendon tenderness noted.  Lymphadenopathy:    She has no cervical adenopathy.  Neurological: She is alert and oriented to person, place, and time. She has normal strength. No cranial nerve deficit or sensory deficit. Coordination normal.  Skin: Skin is warm and dry. She is not diaphoretic.  6 cm diameter erythematous wheel with  central puncture wound.  There is tenderness and warmth to palpation with visible swelling.  Psychiatric: She has a normal mood and affect. Her behavior is normal. Judgment and thought content normal.    ED Course  Procedures (including critical care time) Labs Review Labs Reviewed - No data to display  Imaging Review No results found.   EKG Interpretation None      MDM   Final diagnoses:  Insect bite of left knee with local reaction, initial encounter   Patient is a 73 y.o. Female who presents to the ED with localized reaction to the left knee.  There are no signs of systemic involvement.  Vitals are stable.  Patient treated here with solumedrol 125 mg,  pepcid 20 mg, and 50 mg benadryl with some relief of symptoms.  Patient to be discharged home with prescriptions for pepcid and prednisone.  Patient has benadryl at home.  Patient is stable for discharge at this time.  Patient was also seen by and discussed with Dr. Colvin Caroli who agrees with the above plan.  Patient states understanding and agreement.  Patient to return for systemic symptoms.      Cherylann Parr, PA-C 04/23/14 0010  Jamie Kato Forcucci, PA-C 04/23/14 0010

## 2014-04-23 MED ORDER — DIPHENHYDRAMINE HCL 25 MG PO TABS: 25.0000 mg | ORAL_TABLET | Freq: Four times a day (QID) | ORAL | Status: DC

## 2014-04-23 MED ORDER — FAMOTIDINE 20 MG PO TABS: 20.0000 mg | ORAL_TABLET | Freq: Two times a day (BID) | ORAL | Status: DC

## 2014-04-23 MED ORDER — PREDNISONE 10 MG PO TABS: 40.0000 mg | ORAL_TABLET | Freq: Every day | ORAL | Status: AC

## 2014-04-23 NOTE — ED Provider Notes (Signed)
Medical screening examination/treatment/procedure(s) were conducted as a shared visit with non-physician practitioner(s) and myself.  I personally evaluated the patient during the encounter.   EKG Interpretation None       Adabelle Griffiths, MD 04/23/14 0035 

## 2014-04-23 NOTE — Discharge Instructions (Signed)
Anaphylactic Reaction An anaphylactic reaction is a sudden, severe allergic reaction that involves the whole body. It can be life threatening. A hospital stay is often required. People with asthma, eczema, or hay fever are slightly more likely to have an anaphylactic reaction. CAUSES  An anaphylactic reaction may be caused by anything to which you are allergic. After being exposed to the allergic substance, your immune system becomes sensitized to it. When you are exposed to that allergic substance again, an allergic reaction can occur. Common causes of an anaphylactic reaction include: Medicines. Foods, especially peanuts, wheat, shellfish, milk, and eggs. Insect bites or stings. Blood products. Chemicals, such as dyes, latex, and contrast material used for imaging tests. SYMPTOMS  When an allergic reaction occurs, the body releases histamine and other substances. These substances cause symptoms such as tightening of the airway. Symptoms often develop within seconds or minutes of exposure. Symptoms may include: Skin rash or hives. Itching. Chest tightness. Swelling of the eyes, tongue, or lips. Trouble breathing or swallowing. Lightheadedness or fainting. Anxiety or confusion. Stomach pains, vomiting, or diarrhea. Nasal congestion. A fast or irregular heartbeat (palpitations). DIAGNOSIS  Diagnosis is based on your history of recent exposure to allergic substances, your symptoms, and a physical exam. Your caregiver may also perform blood or urine tests to confirm the diagnosis. TREATMENT  Epinephrine medicine is the main treatment for an anaphylactic reaction. Other medicines that may be used for treatment include antihistamines, steroids, and albuterol. In severe cases, fluids and medicine to support blood pressure may be given through an intravenous line (IV). Even if you improve after treatment, you need to be observed to make sure your condition does not get worse. This may require a stay  in the hospital. [place] a medical alert bracelet or necklace stating your allergy. You and your family must learn how to use an anaphylaxis kit or give an epinephrine injection to temporarily treat an emergency allergic reaction. Always carry your epinephrine injection or anaphylaxis kit with you. This can be lifesaving if you have a severe reaction. Do not drive or perform tasks after treatment until the medicines used to treat your reaction have worn off, or until your caregiver says it is okay. If you have hives or a rash: Take medicines as directed by your caregiver. You may use an over-the-counter antihistamine (diphenhydramine) as needed. Apply cold compresses to the skin or take baths in cool water. Avoid hot baths or showers. SEEK MEDICAL CARE IF:  You develop symptoms of an allergic reaction to a new substance. Symptoms may start right away or minutes later. You develop a rash, hives, or itching. You develop new symptoms. SEEK IMMEDIATE MEDICAL CARE IF:  You have swelling of the mouth, difficulty breathing, or wheezing. You have a tight feeling in your chest or throat. You develop hives, swelling, or itching all over your body. You develop severe vomiting or diarrhea. You feel faint or pass out. This is an emergency. Use your epinephrine injection or anaphylaxis kit as you have been instructed. Call your local emergency services (911 in [place] Even if you improve after the injection, you need to be examined at a hospital emergency department. MAKE SURE YOU:  Understand these instructions. Will watch your condition. Will get help right away if you are not doing well or get worse. Document Released: 07/25/2005 Document Revised: 07/30/2013 Document Reviewed: 10/26/2011 [facility] Patient Information 2015 [place], [place]. This information is not intended to replace advice given to you by your  health care provider. Make sure you discuss any questions you have with  your health care provider. Bee, Wasp, or Hornet Sting Your caregiver has diagnosed you as having an insect sting. An insect sting appears as a red lump in the skin that sometimes has a tiny hole in the center, or it may have a stinger in the center of the wound. The most common stings are from wasps, hornets and bees. Individuals have different reactions to insect stings.  A normal reaction may cause pain, swelling, and redness around the sting site.  A localized allergic reaction may cause swelling and redness that extends beyond the sting site.  A large local reaction may continue to develop over the next 12 to 36 hours.  On occasion, the reactions can be severe (anaphylactic reaction). An anaphylactic reaction may cause wheezing; difficulty breathing; chest pain; fainting; raised, itchy, red patches on the skin; a sick feeling to your stomach (nausea); vomiting; cramping; or diarrhea. If you have had an anaphylactic reaction to an insect sting in the past, you are more likely to have one again. HOME CARE INSTRUCTIONS   With bee stings, a small sac of poison is left in the wound. Brushing across this with something such as a credit card, or anything similar, will help remove this and decrease the amount of the reaction. This same procedure will not help a wasp sting as they do not leave behind a stinger and poison sac.  Apply a cold compress for 10 to 20 minutes every hour for 1 to 2 days, depending on severity, to reduce swelling and itching.  To lessen pain, a paste made of water and baking soda may be rubbed on the bite or sting and left on for 5 minutes.  To relieve itching and swelling, you may use take medication or apply medicated creams or lotions as directed.  Only take over-the-counter or prescription medicines for pain, discomfort, or fever as directed by your caregiver.  Wash the sting site daily with soap and water. Apply antibiotic ointment on the sting site as directed.  If  you suffered a severe reaction:  If you did not require hospitalization, an adult will need to stay with you for 24 hours in case the symptoms return.  You may need to wear a medical bracelet or necklace stating the allergy.  You and your family need to learn when and how to use an anaphylaxis kit or epinephrine injection.  If you have had a severe reaction before, always carry your anaphylaxis kit with you. SEEK MEDICAL CARE IF:   None of the above helps within 2 to 3 days.  The area becomes red, warm, tender, and swollen beyond the area of the bite or sting.  You have an oral temperature above 102 F (38.9 C). SEEK IMMEDIATE MEDICAL CARE IF:  You have symptoms of an allergic reaction which are:  Wheezing.  Difficulty breathing.  Chest pain.  Lightheadedness or fainting.  Itchy, raised, red patches on the skin.  Nausea, vomiting, cramping or diarrhea. ANY OF THESE SYMPTOMS MAY REPRESENT A SERIOUS PROBLEM THAT IS AN EMERGENCY. Do not wait to see if the symptoms will go away. Get medical help right away. Call your local emergency services (911 in [place] DO NOT drive yourself to the hospital. MAKE SURE YOU:   Understand these instructions.  Will watch your condition.  Will get help right away if you are not doing well or get worse. Document Released: 07/25/2005 Document Revised: 10/17/2011 Document  Reviewed: 01/09/2010 [employer] Patient Information 2015 [place], [place]. This information is not intended to replace advice given to you by your health care provider. Make sure you discuss any questions you have with your health care provider.

## 2014-08-19 ENCOUNTER — Other Ambulatory Visit: Payer: Self-pay

## 2014-08-19 DIAGNOSIS — Z1231 Encounter for screening mammogram for malignant neoplasm of breast: Secondary | ICD-10-CM

## 2014-09-08 ENCOUNTER — Ambulatory Visit: Payer: Self-pay

## 2014-09-12 ENCOUNTER — Ambulatory Visit
Admission: RE | Admit: 2014-09-12 | Discharge: 2014-09-12 | Disposition: A | Discharge status: Home / Self Care | Payer: Medicare Other | Source: Ambulatory Visit

## 2014-09-12 ENCOUNTER — Encounter (INDEPENDENT_AMBULATORY_CARE_PROVIDER_SITE_OTHER): Payer: Self-pay

## 2014-09-12 DIAGNOSIS — Z1231 Encounter for screening mammogram for malignant neoplasm of breast: Secondary | ICD-10-CM

## 2014-09-19 ENCOUNTER — Other Ambulatory Visit: Payer: Self-pay | Admitting: Family Medicine

## 2014-09-19 DIAGNOSIS — M8580 Other specified disorders of bone density and structure, unspecified site: Secondary | ICD-10-CM

## 2014-09-26 ENCOUNTER — Ambulatory Visit
Admission: RE | Admit: 2014-09-26 | Discharge: 2014-09-26 | Disposition: A | Discharge status: Home / Self Care | Payer: Medicare Other | Source: Ambulatory Visit | Attending: Family Medicine | Admitting: Family Medicine

## 2014-09-26 DIAGNOSIS — M8580 Other specified disorders of bone density and structure, unspecified site: Secondary | ICD-10-CM

## 2014-11-20 ENCOUNTER — Other Ambulatory Visit: Payer: Self-pay | Admitting: Gastroenterology

## 2014-11-24 ENCOUNTER — Encounter (HOSPITAL_COMMUNITY)
Admission: RE | Disposition: A | Discharge status: Home / Self Care | Payer: Self-pay | Source: Ambulatory Visit | Attending: Gastroenterology

## 2014-11-24 ENCOUNTER — Ambulatory Visit (HOSPITAL_COMMUNITY)
Admission: RE | Admit: 2014-11-24 | Discharge: 2014-11-24 | Disposition: A | Discharge status: Home / Self Care | Payer: Medicare Other | Source: Ambulatory Visit | Attending: Gastroenterology | Admitting: Gastroenterology

## 2014-11-24 DIAGNOSIS — Z981 Arthrodesis status: Secondary | ICD-10-CM | POA: Diagnosis not present

## 2014-11-24 DIAGNOSIS — Z90710 Acquired absence of both cervix and uterus: Secondary | ICD-10-CM | POA: Diagnosis not present

## 2014-11-24 DIAGNOSIS — Z9049 Acquired absence of other specified parts of digestive tract: Secondary | ICD-10-CM | POA: Diagnosis not present

## 2014-11-24 DIAGNOSIS — I10 Essential (primary) hypertension: Secondary | ICD-10-CM | POA: Insufficient documentation

## 2014-11-24 DIAGNOSIS — Z85038 Personal history of other malignant neoplasm of large intestine: Secondary | ICD-10-CM | POA: Diagnosis present

## 2014-11-24 DIAGNOSIS — E559 Vitamin D deficiency, unspecified: Secondary | ICD-10-CM | POA: Diagnosis not present

## 2014-11-24 DIAGNOSIS — K219 Gastro-esophageal reflux disease without esophagitis: Secondary | ICD-10-CM | POA: Diagnosis not present

## 2014-11-24 DIAGNOSIS — Z980 Intestinal bypass and anastomosis status: Secondary | ICD-10-CM | POA: Diagnosis not present

## 2014-11-24 HISTORY — PX: FLEXIBLE SIGMOIDOSCOPY: SHX5431

## 2014-11-24 SURGERY — SIGMOIDOSCOPY, FLEXIBLE
Anesthesia: Moderate Sedation

## 2014-11-24 MED ORDER — SODIUM CHLORIDE 0.9 % IV SOLN: INTRAVENOUS | Status: DC

## 2014-11-24 NOTE — Discharge Instructions (Signed)
Flexible Sigmoidoscopy, Care After °Refer to this sheet in the next few weeks. These instructions provide you with information on caring for yourself after your procedure. Your health care provider may also give you more specific instructions. Your treatment has been planned according to current medical practices, but problems sometimes occur. Call your health care provider if you have any problems or questions after your procedure. °WHAT TO EXPECT AFTER THE PROCEDURE °After your procedure, it is typical to have the following:  °· Abdominal cramps. °· Bloating. °· A small amount of rectal bleeding if you had a biopsy. °HOME CARE INSTRUCTIONS °· Only take over-the-counter or prescription medicines for pain, fever, or discomfort as directed by your health care provider. °· Resume your normal diet and activities as directed by your health care provider. °SEEK MEDICAL CARE IF: °· You have abdominal pain or cramping that lasts longer than 1 hour after the procedure. °· You continue to have small amounts of rectal bleeding after 24 hours. °· You have nausea or vomiting. °· You feel weak or dizzy. °SEEK IMMEDIATE MEDICAL CARE IF:  °· You have a fever. °· You pass large blood clots or see a large amount of blood in the toilet after having a bowel movement. This may also occur 10-14 days after the procedure. It is more likely if you had a biopsy. °· You develop abdominal pain that is not relieved with medicine or your abdominal pain gets worse. °· You have nausea or vomiting for more than 24 hours after the procedure. °Document Released: 07/30/2013 Document Reviewed: 07/30/2013 °[employer]® Patient Information ©2015 [employer]. This information is not intended to replace advice given to you by your health care provider. Make sure you discuss any questions you have with your health care provider. ° °

## 2014-11-24 NOTE — H&P (Signed)
  Procedure: Surveillance proctoscopy. 12/26/2012 proctoscopic exam with removal of a 12 mm tubular adenomatous rectal polyp. Subtotal colectomy with ileorectal anastomosis to treat colon cancer.  History: The patient is a 74 year old female born 01/06/1941. In May 2014 I removed a 12 mm tubular adenomatous polyp from the distal rectum. She has undergone a subtotal colectomy with ileorectal anastomosis to treat colon cancer in the past.  Past medical history: Cholecystectomy. Appendectomy. Total abdominal hysterectomy. Lysis of adhesions. Subtotal colectomy. Cervical laminectomy. Cervical spine fusion. Cystoscopy. Brain aneurysm repair. Esophageal reflux. Hypertension. Vitamin D deficiency.  Medication allergies: Demerol. Amoxicillin. Wasp Venom  Exam: The patient is alert and lying comfortably on the endoscopy stretcher. Abdomen is soft and nontender to palpation. Lungs clear to auscultation. Cardiac exam reveals a regular rhythm.  Plan: Proceed with surveillance proctoscopy

## 2014-11-24 NOTE — Op Note (Signed)
Procedure: Surveillance proctoscopy. 12/26/2012 proctoscopic exam with removal of a 12 mm tubular adenomatous rectal polyp. Remote subtotal colectomy with ileorectal anastomosis to treat colon cancer  Endoscopist: Earle Gell  Premedication: None  Procedure: The patient was placed in the left lateral decubitus position. Anal inspection and digital rectal exam were normal. The Pentax pediatric colonoscope was introduced into the rectum and advanced to the ileo-rectal surgical anastomosis. Colonic preparation for the exam today was good.  Endoscopic examination of the rectum and retroflexed view of the distal rectum was normal. The ileo-rectal surgical anastomosis appeared normal.  Recommendation: Schedule surveillance proctoscopic exam in 5 years.

## 2014-11-25 ENCOUNTER — Encounter (HOSPITAL_COMMUNITY): Payer: Self-pay | Admitting: Gastroenterology

## 2015-08-21 ENCOUNTER — Other Ambulatory Visit: Payer: Self-pay

## 2015-08-21 DIAGNOSIS — Z1231 Encounter for screening mammogram for malignant neoplasm of breast: Secondary | ICD-10-CM

## 2015-09-18 ENCOUNTER — Ambulatory Visit: Payer: Medicare Other

## 2015-10-06 ENCOUNTER — Ambulatory Visit
Admission: RE | Admit: 2015-10-06 | Discharge: 2015-10-06 | Disposition: A | Discharge status: Home / Self Care | Payer: Medicare Other | Source: Ambulatory Visit

## 2015-10-06 DIAGNOSIS — Z1231 Encounter for screening mammogram for malignant neoplasm of breast: Secondary | ICD-10-CM

## 2016-09-15 ENCOUNTER — Other Ambulatory Visit: Payer: Self-pay | Admitting: Family

## 2016-09-15 DIAGNOSIS — Z1231 Encounter for screening mammogram for malignant neoplasm of breast: Secondary | ICD-10-CM

## 2016-09-15 DIAGNOSIS — E2839 Other primary ovarian failure: Secondary | ICD-10-CM

## 2016-10-06 ENCOUNTER — Ambulatory Visit
Admission: RE | Admit: 2016-10-06 | Discharge: 2016-10-06 | Disposition: A | Discharge status: Home / Self Care | Payer: Medicare Other | Source: Ambulatory Visit | Attending: Family | Admitting: Family

## 2016-10-06 DIAGNOSIS — E2839 Other primary ovarian failure: Secondary | ICD-10-CM

## 2016-10-06 DIAGNOSIS — Z1231 Encounter for screening mammogram for malignant neoplasm of breast: Secondary | ICD-10-CM

## 2017-08-28 ENCOUNTER — Other Ambulatory Visit: Payer: Self-pay | Admitting: Family Medicine

## 2017-08-28 DIAGNOSIS — Z1231 Encounter for screening mammogram for malignant neoplasm of breast: Secondary | ICD-10-CM

## 2017-10-09 ENCOUNTER — Ambulatory Visit
Admission: RE | Admit: 2017-10-09 | Discharge: 2017-10-09 | Disposition: A | Discharge status: Home / Self Care | Payer: Medicare Other | Source: Ambulatory Visit | Attending: Family Medicine | Admitting: Family Medicine

## 2017-10-09 DIAGNOSIS — Z1231 Encounter for screening mammogram for malignant neoplasm of breast: Secondary | ICD-10-CM

## 2018-02-19 DIAGNOSIS — I671 Cerebral aneurysm, nonruptured: Secondary | ICD-10-CM | POA: Insufficient documentation

## 2018-08-30 ENCOUNTER — Other Ambulatory Visit: Payer: Self-pay | Admitting: Family Medicine

## 2018-08-30 DIAGNOSIS — Z1231 Encounter for screening mammogram for malignant neoplasm of breast: Secondary | ICD-10-CM

## 2018-09-05 ENCOUNTER — Other Ambulatory Visit: Payer: Self-pay | Admitting: Family Medicine

## 2018-09-05 DIAGNOSIS — M8580 Other specified disorders of bone density and structure, unspecified site: Secondary | ICD-10-CM

## 2018-10-23 ENCOUNTER — Other Ambulatory Visit: Payer: Medicare Other

## 2018-10-23 ENCOUNTER — Ambulatory Visit: Payer: Medicare Other

## 2018-12-24 ENCOUNTER — Other Ambulatory Visit: Payer: Medicare Other

## 2018-12-24 ENCOUNTER — Ambulatory Visit: Payer: Medicare Other

## 2019-02-13 ENCOUNTER — Ambulatory Visit
Admission: RE | Admit: 2019-02-13 | Discharge: 2019-02-13 | Disposition: A | Discharge status: Home / Self Care | Payer: Medicare Other | Source: Ambulatory Visit | Attending: Family Medicine | Admitting: Family Medicine

## 2019-02-13 ENCOUNTER — Other Ambulatory Visit: Payer: Self-pay

## 2019-02-13 DIAGNOSIS — M8580 Other specified disorders of bone density and structure, unspecified site: Secondary | ICD-10-CM

## 2019-02-13 DIAGNOSIS — Z1231 Encounter for screening mammogram for malignant neoplasm of breast: Secondary | ICD-10-CM

## 2019-02-13 IMAGING — MG DIGITAL SCREENING BILATERAL MAMMOGRAM WITH TOMO AND CAD
5 series · 6 of 21 positions shown · non-contrast
Comparison: Previous exam(s).

CLINICAL DATA: Screening.

EXAM:
DIGITAL SCREENING BILATERAL MAMMOGRAM WITH TOMO AND CAD

[R MLO synth-2D]
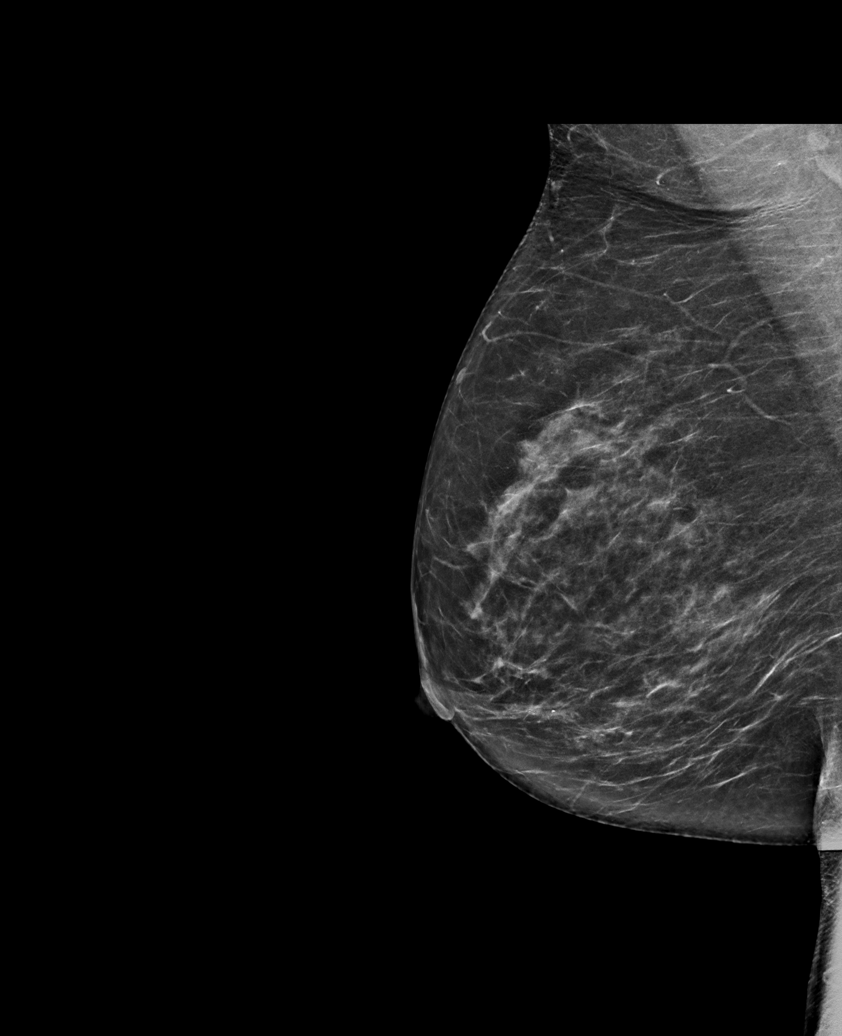

[L CC tomo · 2 of 71 frames shown]
[frame 23/71]
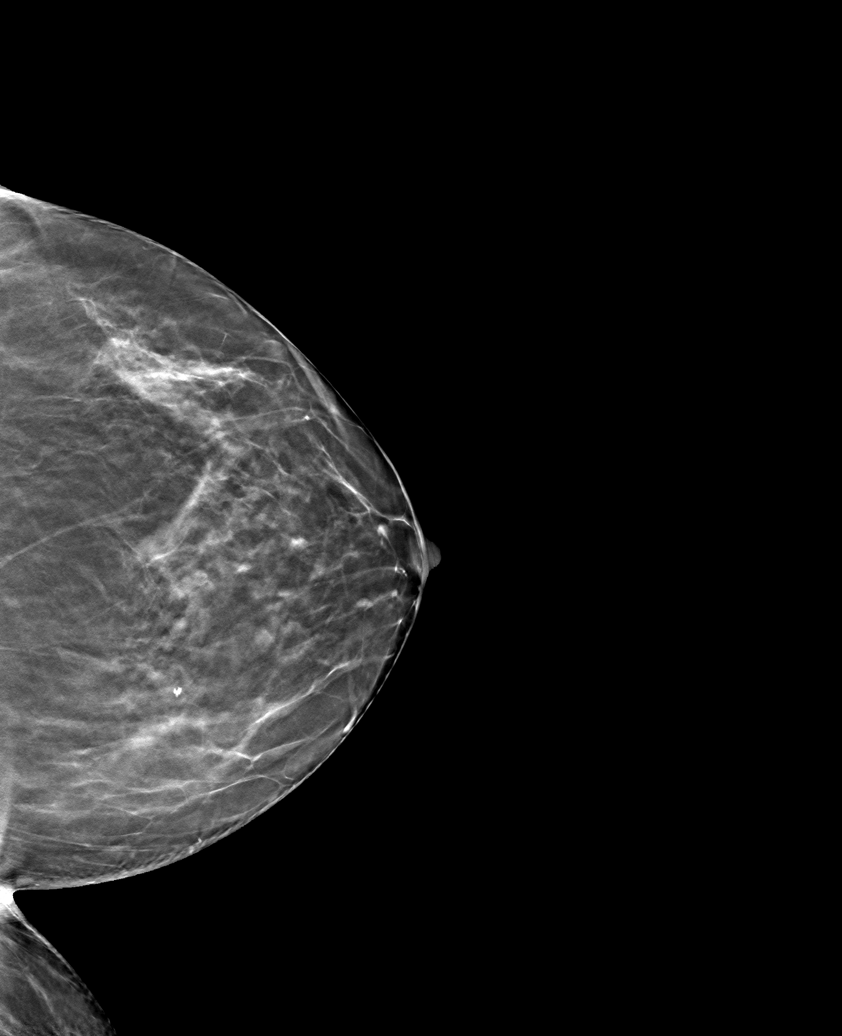
[frame 36/71]
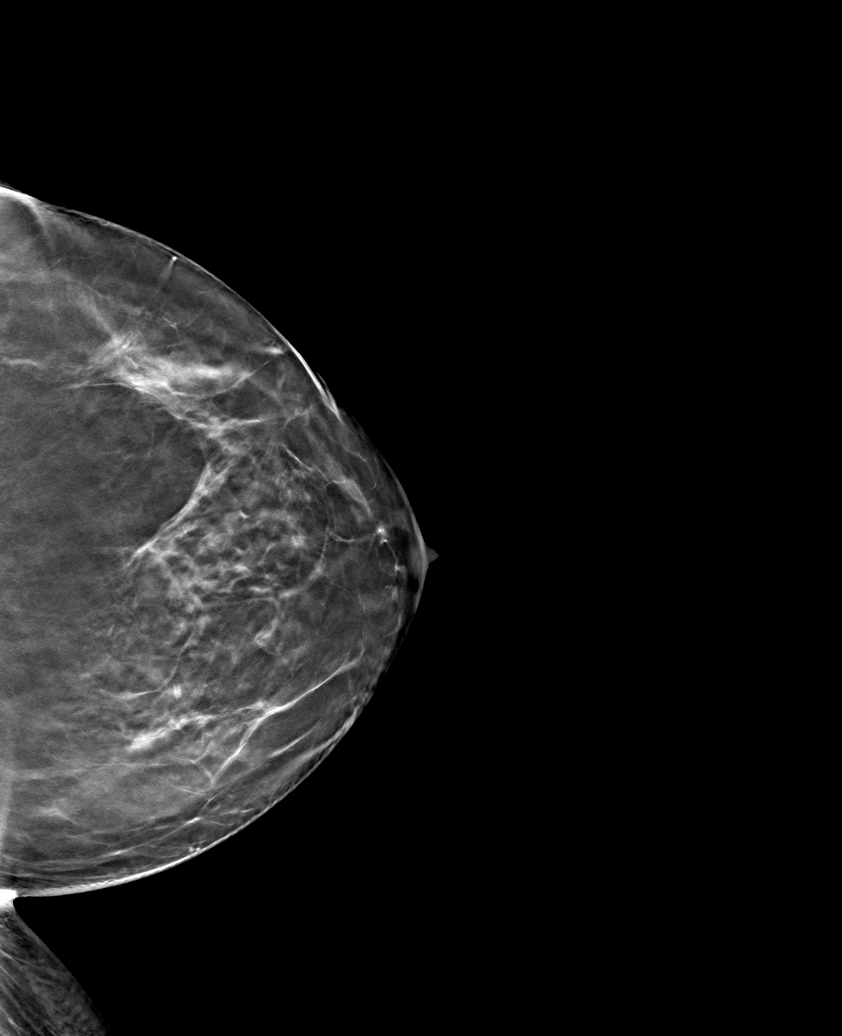

[R MLO tomo (1 of 2) · tomo slice 36/71.0]
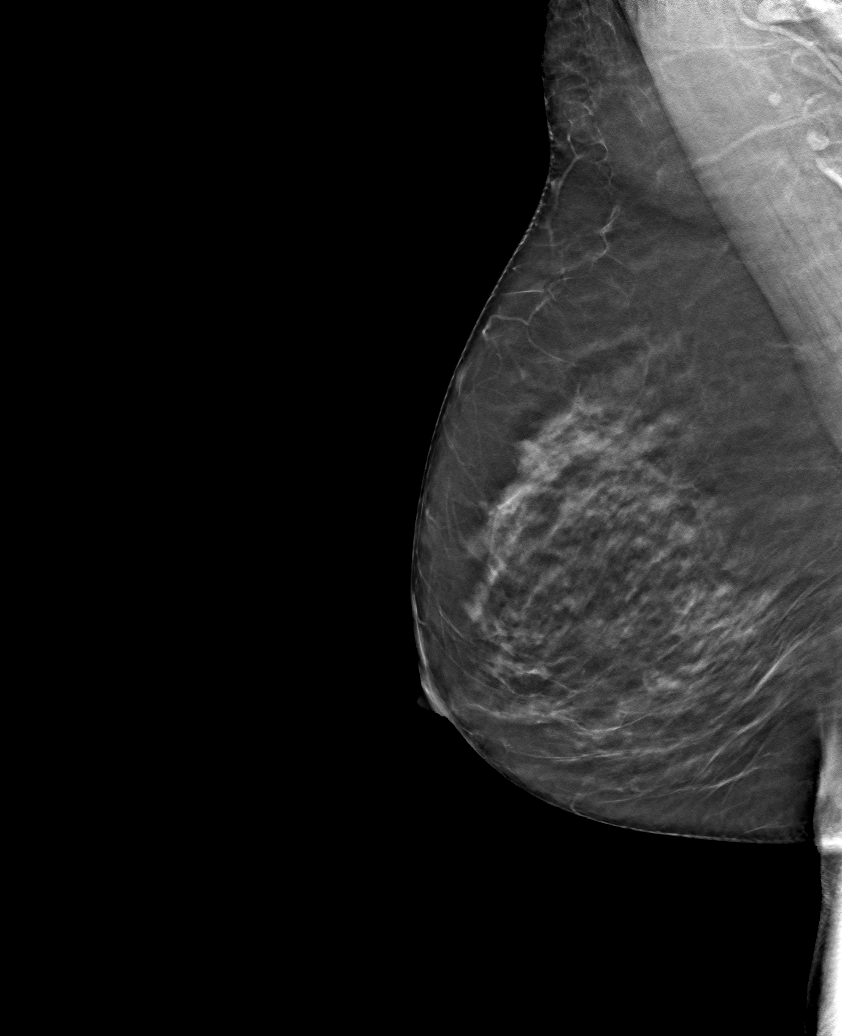

[L MLO tomo · tomo slice 35/69.0]
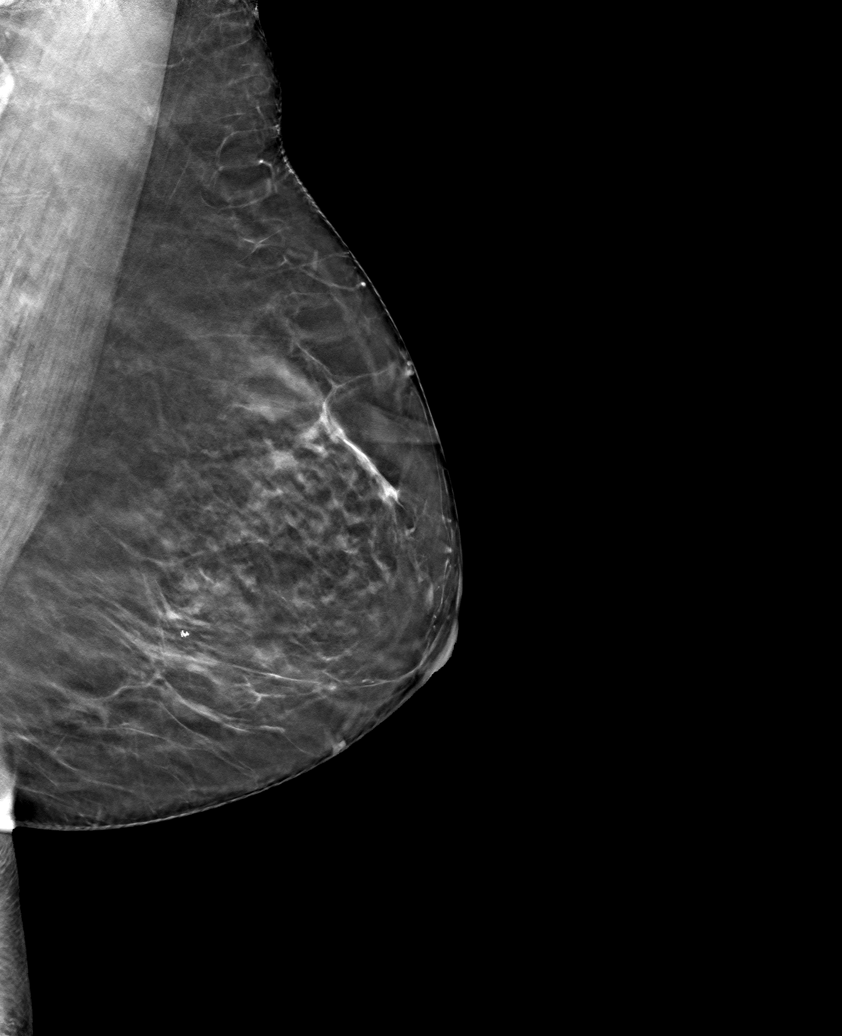

[R MLO tomo (2 of 2) · tomo slice 35/68.0]
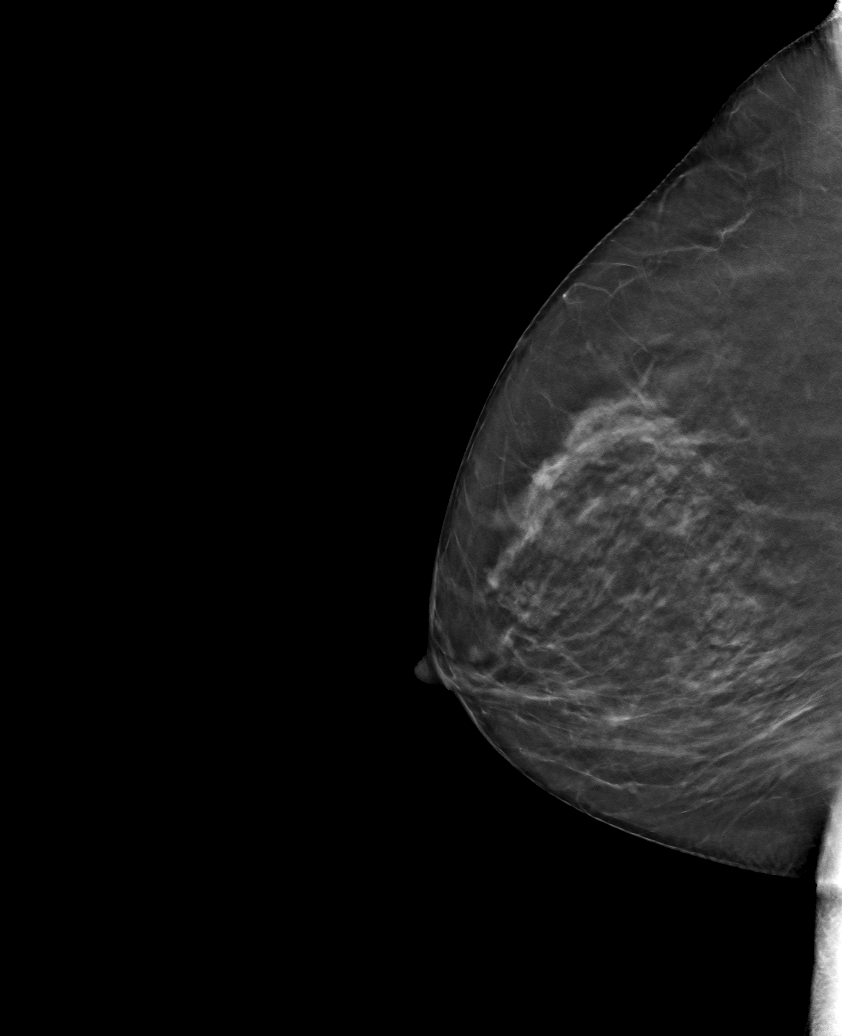

[6 of 21 positions shown; findings below may reference images not displayed]

ACR Breast Density Category b: There are scattered areas of
fibroglandular density.
FINDINGS: There are no findings suspicious for malignancy. Images were
processed with CAD.
IMPRESSION: No mammographic evidence of malignancy. A result letter of this
screening mammogram will be mailed directly to the patient.

RECOMMENDATION:
Screening mammogram in one year. (Code:[TQ])

BI-RADS CATEGORY  1: Negative.

## 2019-04-03 ENCOUNTER — Encounter: Payer: Self-pay | Admitting: Cardiology

## 2019-04-03 ENCOUNTER — Other Ambulatory Visit: Payer: Self-pay

## 2019-04-03 ENCOUNTER — Ambulatory Visit (INDEPENDENT_AMBULATORY_CARE_PROVIDER_SITE_OTHER): Payer: Medicare Other | Admitting: Cardiology

## 2019-04-03 DIAGNOSIS — I671 Cerebral aneurysm, nonruptured: Secondary | ICD-10-CM

## 2019-04-03 DIAGNOSIS — R0600 Dyspnea, unspecified: Secondary | ICD-10-CM

## 2019-04-03 DIAGNOSIS — R002 Palpitations: Secondary | ICD-10-CM

## 2019-04-03 DIAGNOSIS — R072 Precordial pain: Secondary | ICD-10-CM

## 2019-04-03 DIAGNOSIS — R0609 Other forms of dyspnea: Secondary | ICD-10-CM

## 2019-04-03 HISTORY — DX: Palpitations: R00.2

## 2019-04-03 HISTORY — DX: Other forms of dyspnea: R06.09

## 2019-04-03 HISTORY — DX: Dyspnea, unspecified: R06.00

## 2019-04-03 HISTORY — DX: Cerebral aneurysm, nonruptured: I67.1

## 2019-04-03 HISTORY — DX: Precordial pain: R07.2

## 2019-04-03 MED ORDER — NITROGLYCERIN 0.4 MG SL SUBL: 0.4000 mg | SUBLINGUAL_TABLET | SUBLINGUAL | 11 refills | Status: DC | PRN

## 2019-04-03 NOTE — Addendum Note (Signed)
Addended by: Ashok Norris on: 04/03/2019 02:43 PM   Modules accepted: Orders

## 2019-04-03 NOTE — Progress Notes (Signed)
Cardiology Consultation:    Date:  04/03/2019   ID:  Kimberly Gay, DOB July 13, 1941, MRN [id number]  PCP:  Aretta Nip, MD  Cardiologist:  Jenne Campus, MD   Referring MD: Aretta Nip, MD   Chief Complaint  Patient presents with  . Palpitations    History of Present Illness:    Kimberly Gay is a 78 y.o. female who is being seen today for the evaluation of shortness of breath/chest pain/palpitations at the request of Rankins, Bill Salinas, MD.  For last few months she experienced situation when she wake up in the morning with heaviness in the chest pounding in her chest not fast just forceful heart beating.  She stops to sit up get in the chair and a half an hour later she started feeling better.  She also describes a sensation when she gets short of breath much easier while walking especially uphill that been happening the last few months.  There is no swelling of lower extremities.  She does not have any typical chest tightness squeezing pressure burning chest with exertion just shortness of breath tightness and heaviness happen in the middle of the night.  She notes that if she does not eat heavy dinner before she goes to bed she does not have the sensation.  She has been treated for stomach issue with limited success.  She does not exercise on a regular basis.  She does have family history of coronary artery disease but no premature.  Hypertension, apparently cholesterol was fine I do have results from last year which showed LDL barely above 100 and HDL at 40. She never smoked She used to exercise on a regular basis by walking however she does have some problem left hip and she cannot do as much as she used to  Past Medical History:  Diagnosis Date  . Cancer ([place]) 1994   colon-resection  . Cerebral aneurysm, nonruptured 2010   not treated due to location-monitor yearly  . GERD (gastroesophageal reflux disease)    infrequent  . Hypertension    well controlled  .  Incontinence of urine   . Joint pain, knee   . Urgency of urination     Past Surgical History:  Procedure Laterality Date  . ABDOMINAL HYSTERECTOMY    . ANEURYSM COILING  2010   cerebral coiling-no deficits  . BREAST EXCISIONAL BIOPSY Left 04/05/1994  . CHOLECYSTECTOMY    . CYSTOSCOPY  09/03/2012   Procedure: CYSTOSCOPY;  Surgeon: Ailene Rud, MD;  Location: [facility];  Service: Urology;  Laterality: N/A;  . FLEXIBLE SIGMOIDOSCOPY N/A 11/24/2014   Procedure: FLEXIBLE SIGMOIDOSCOPY;  Surgeon: Garlan Fair, MD;  Location: [facility];  Service: Endoscopy;  Laterality: N/A;  . LAMINECTOMY    . LYSIS OF ADHESION  2000's   with cholecystectomy procedure  . PILONIDAL CYST EXCISION    . PUBOVAGINAL SLING  09/03/2012   Procedure: Gaynelle Arabian;  Surgeon: Ailene Rud, MD;  [facility];  Service: Urology;  Laterality: N/A;  LYNX SLING     Current Medications: Current Meds  Medication Sig  . acetaminophen (TYLENOL) 500 MG tablet Take 500 mg by mouth every 6 (six) hours as needed for mild pain.   Marland Kitchen aspirin EC 81 MG tablet Take 81 mg by mouth daily.  Marland Kitchen atenolol (TENORMIN) 50 MG tablet Take 50 mg by mouth daily.  . cetirizine (ZYRTEC) 10 MG tablet Take 10 mg by mouth daily.  Marland Kitchen  cholecalciferol (VITAMIN D) 1000 UNITS tablet Take 2,000 Units by mouth daily.   . diphenhydrAMINE (BENADRYL) 25 MG tablet Take 25 mg by mouth every 4 (four) hours as needed for allergies.  Marland Kitchen EPINEPHrine (EPIPEN) 0.3 mg/0.3 mL SOAJ injection Inject 0.3 mLs (0.3 mg total) into the muscle once.  . pantoprazole (PROTONIX) 20 MG tablet Take 20 mg by mouth daily.     Allergies:   Amoxicillin, Meperidine and related, and Wasp venom   Social History   Socioeconomic History  . Marital status: Widowed    Spouse name: Not on file  . Number of children: Not on file  . Years of education: Not on file  . Highest education level: Not on file  Occupational  History  . Not on file  Social Needs  . Financial resource strain: Not on file  . Food insecurity    Worry: Not on file    Inability: Not on file  . Transportation needs    Medical: Not on file    Non-medical: Not on file  Tobacco Use  . Smoking status: Never Smoker  . Smokeless tobacco: Never Used  Substance and Sexual Activity  . Alcohol use: No  . Drug use: No  . Sexual activity: Not on file  Lifestyle  . Physical activity    Days per week: Not on file    Minutes per session: Not on file  . Stress: Not on file  Relationships  . Social Herbalist on phone: Not on file    Gets together: Not on file    Attends religious service: Not on file    Active member of club or organization: Not on file    Attends meetings of clubs or organizations: Not on file    Relationship status: Not on file  Other Topics Concern  . Not on file  Social History Narrative  . Not on file     Family History: The patient's family history includes Breast cancer in her cousin; Diabetes in an other family member; Heart attack in her father; Seizures in her mother. ROS:   Please see the history of present illness.    All 14 point review of systems negative except as described per history of present illness.  EKGs/Labs/Other Studies Reviewed:    The following studies were reviewed today: Normal sinus rhythm normal P interval possibility of anterior septal wall MI, nonspecific ST segment changes    Recent Labs: No results found for requested labs within last 8760 hours.  Recent Lipid Panel No results found for: CHOL, TRIG, HDL, CHOLHDL, VLDL, LDLCALC, LDLDIRECT  Physical Exam:    VS:  BP 122/66   Pulse 79   Ht 5' 4.5" (1.638 m)   Wt 186 lb 12.8 oz (84.7 kg)   SpO2 98%   BMI 31.57 kg/m     Wt Readings from Last 3 Encounters:  04/03/19 186 lb 12.8 oz (84.7 kg)  11/24/14 185 lb (83.9 kg)  09/03/12 181 lb 5 oz (82.2 kg)     GEN:  Well nourished, well developed in no acute  distress HEENT: Normal NECK: No JVD; No carotid bruits LYMPHATICS: No lymphadenopathy CARDIAC: RRR, no murmurs, no rubs, no gallops RESPIRATORY:  Clear to auscultation without rales, wheezing or rhonchi  ABDOMEN: Soft, non-tender, non-distended MUSCULOSKELETAL:  No edema; No deformity  SKIN: Warm and dry NEUROLOGIC:  Alert and oriented x 3 PSYCHIATRIC:  Normal affect   ASSESSMENT:    1. Palpitations   2.  Dyspnea on exertion   3. Precordial chest pain   4. Brain aneurysm    PLAN:    In order of problems listed above:  1. Palpitations she describes sensation of pounding in the chest with chest heaviness that awakened her and usually in the morning daily for the last few months.  When she check her blood pressure is usually elevated however heart rate is usually about 70-80.  I do not think this is an arrhythmia but given her symptoms I am worried more about potentially having proximal nocturnal dyspnea.  She also noted decreased ability to exercise as well as easily getting short of breath while walking.  I think what we need to do is to check echocardiogram to assess left ventricular ejection fraction.  As a part of evaluation stress test will be done as well.  She is taking already aspirin which I will continue aspirin, I will ask you to break atenolol in half and take half in the morning half in the afternoon.  I will give her prescription for nitroglycerin with instruction to take it when she has the pain. 2. This been exertion echocardiogram will be done as well as stress test. 3. Precordial chest pain again stress test will be done 4. Brain aneurysm she did have some stents placed there.  She is doing well from that point review recent angiogram was fine and she was told to follow-up within the next 3 years.   Medication Adjustments/Labs and Tests Ordered: Current medicines are reviewed at length with the patient today.  Concerns regarding medicines are outlined above.  No orders  of the defined types were placed in this encounter.  No orders of the defined types were placed in this encounter.   Signed, Park Liter, MD, [facility]. 04/03/2019 2:16 PM    [place]

## 2019-04-03 NOTE — Patient Instructions (Signed)
Medication Instructions:  Your physician recommends that you continue on your current medications as directed. Please refer to the Current Medication list given to you today.  If you need a refill on your cardiac medications before your next appointment, please call your pharmacy.   Lab work: None.  If you have labs (blood work) drawn today and your tests are completely normal, you will receive your results only by:  [place] (if you have MyChart) OR  A paper copy in the mail If you have any lab test that is abnormal or we need to change your treatment, we will call you to review the results.  Testing/Procedures: Your physician has requested that you have an echocardiogram. Echocardiography is a painless test that uses sound waves to create images of your heart. It provides your doctor with information about the size and shape of your heart and how well your hearts chambers and valves are working. This procedure takes approximately one hour. There are no restrictions for this procedure.  Your physician has requested that you have a lexiscan myoview. For further information please visit [email or web address]. Please follow instruction sheet, as given.    Follow-Up: At [facility], you and your health needs are our priority.  As part of our continuing mission to provide you with exceptional heart care, we have created designated Provider Care Teams.  These Care Teams include your primary Cardiologist (physician) and Advanced Practice Providers (APPs -  Physician Assistants and Nurse Practitioners) who all work together to provide you with the care you need, when you need it. You will need a follow up appointment in 1 months.  Please call our office 2 months in advance to schedule this appointment.  You may see No primary care provider on file. or another member of our [employer] Provider Team in [place]: Shirlee More, MD  Jyl Heinz, MD  Any Other Special Instructions  Will Be Listed Below (If Applicable).   Cardiac Nuclear Scan A cardiac nuclear scan is a test that measures blood flow to the heart when a person is resting and when he or she is exercising. The test looks for problems such as:  Not enough blood reaching a portion of the heart.  The heart muscle not working normally. You may need this test if:  You have heart disease.  You have had abnormal lab results.  You have had heart surgery or a balloon procedure to open up blocked arteries (angioplasty).  You have chest pain.  You have shortness of breath. In this test, a radioactive dye (tracer) is injected into your bloodstream. After the tracer has traveled to your heart, an imaging device is used to measure how much of the tracer is absorbed by or distributed to various areas of your heart. This procedure is usually done at a hospital and takes 2-4 hours. Tell a health care provider about:  Any allergies you have.  All medicines you are taking, including vitamins, herbs, eye drops, creams, and over-the-counter medicines.  Any problems you or family members have had with anesthetic medicines.  Any blood disorders you have.  Any surgeries you have had.  Any medical conditions you have.  Whether you are pregnant or may be pregnant. What are the risks? Generally, this is a safe procedure. However, problems may occur, including:  Serious chest pain and heart attack. This is only a risk if the stress portion of the test is done.  Rapid heartbeat.  Sensation of warmth in your chest. This  usually passes quickly.  Allergic reaction to the tracer. What happens before the procedure?  Ask your health care provider about changing or stopping your regular medicines. This is especially important if you are taking diabetes medicines or blood thinners.  Follow instructions from your health care provider about eating or drinking restrictions.  Remove your jewelry on the day of the  procedure. What happens during the procedure?  An IV will be inserted into one of your veins.  Your health care provider will inject a small amount of radioactive tracer through the IV.  You will wait for 20-40 minutes while the tracer travels through your bloodstream.  Your heart activity will be monitored with an electrocardiogram (ECG).  You will lie down on an exam table.  Images of your heart will be taken for about 15-20 minutes.  You may also have a stress test. For this test, one of the following may be done: ? You will exercise on a treadmill or stationary bike. While you exercise, your heart's activity will be monitored with an ECG, and your blood pressure will be checked. ? You will be given medicines that will increase blood flow to parts of your heart. This is done if you are unable to exercise.  When blood flow to your heart has peaked, a tracer will again be injected through the IV.  After 20-40 minutes, you will get back on the exam table and have more images taken of your heart.  Depending on the type of tracer used, scans may need to be repeated 3-4 hours later.  Your IV line will be removed when the procedure is over. The procedure may vary among health care providers and hospitals. What happens after the procedure?  Unless your health care provider tells you otherwise, you may return to your normal schedule, including diet, activities, and medicines.  Unless your health care provider tells you otherwise, you may increase your fluid intake. This will help to flush the contrast dye from your body. Drink enough fluid to keep your urine pale yellow.  Ask your health care provider, or the department that is doing the test: ? When will my results be ready? ? How will I get my results? Summary  A cardiac nuclear scan measures the blood flow to the heart when a person is resting and when he or she is exercising.  Tell your health care provider if you are  pregnant.  Before the procedure, ask your health care provider about changing or stopping your regular medicines. This is especially important if you are taking diabetes medicines or blood thinners.  After the procedure, unless your health care provider tells you otherwise, increase your fluid intake. This will help flush the contrast dye from your body.  After the procedure, unless your health care provider tells you otherwise, you may return to your normal schedule, including diet, activities, and medicines. This information is not intended to replace advice given to you by your health care provider. Make sure you discuss any questions you have with your health care provider. Document Released: 08/19/2004 Document Revised: 01/08/2018 Document Reviewed: 01/08/2018 Elsevier Patient Education  [place].    Echocardiogram An echocardiogram is a procedure that uses painless sound waves (ultrasound) to produce an image of the heart. Images from an echocardiogram can provide important information about:  Signs of coronary artery disease (CAD).  Aneurysm detection. An aneurysm is a weak or damaged part of an artery wall that bulges out from the normal force  of blood pumping through the body.  Heart size and shape. Changes in the size or shape of the heart can be associated with certain conditions, including heart failure, aneurysm, and CAD.  Heart muscle function.  Heart valve function.  Signs of a past heart attack.  Fluid buildup around the heart.  Thickening of the heart muscle.  A tumor or infectious growth around the heart valves. Tell a health care provider about:  Any allergies you have.  All medicines you are taking, including vitamins, herbs, eye drops, creams, and over-the-counter medicines.  Any blood disorders you have.  Any surgeries you have had.  Any medical conditions you have.  Whether you are pregnant or may be pregnant. What are the  risks? Generally, this is a safe procedure. However, problems may occur, including:  Allergic reaction to dye (contrast) that may be used during the procedure. What happens before the procedure? No specific preparation is needed. You may eat and drink normally. What happens during the procedure?   An IV tube may be inserted into one of your veins.  You may receive contrast through this tube. A contrast is an injection that improves the quality of the pictures from your heart.  A gel will be applied to your chest.  A wand-like tool (transducer) will be moved over your chest. The gel will help to transmit the sound waves from the transducer.  The sound waves will harmlessly bounce off of your heart to allow the heart images to be captured in real-time motion. The images will be recorded on a computer. The procedure may vary among health care providers and hospitals. What happens after the procedure?  You may return to your normal, everyday life, including diet, activities, and medicines, unless your health care provider tells you not to do that. Summary  An echocardiogram is a procedure that uses painless sound waves (ultrasound) to produce an image of the heart.  Images from an echocardiogram can provide important information about the size and shape of your heart, heart muscle function, heart valve function, and fluid buildup around your heart.  You do not need to do anything to prepare before this procedure. You may eat and drink normally.  After the echocardiogram is completed, you may return to your normal, everyday life, unless your health care provider tells you not to do that. This information is not intended to replace advice given to you by your health care provider. Make sure you discuss any questions you have with your health care provider. Document Released: 07/22/2000 Document Revised: 11/15/2018 Document Reviewed: 08/27/2016 Elsevier Patient Education  2020 [employer].

## 2019-04-03 NOTE — Addendum Note (Signed)
Addended by: Ashok Norris on: 04/03/2019 03:03 PM   Modules accepted: Orders

## 2019-04-04 ENCOUNTER — Telehealth (HOSPITAL_COMMUNITY): Payer: Self-pay | Admitting: *Deleted

## 2019-04-04 ENCOUNTER — Ambulatory Visit (HOSPITAL_BASED_OUTPATIENT_CLINIC_OR_DEPARTMENT_OTHER)
Admission: RE | Admit: 2019-04-04 | Discharge: 2019-04-04 | Disposition: A | Discharge status: Home / Self Care | Payer: Medicare Other | Source: Ambulatory Visit | Attending: Cardiology | Admitting: Cardiology

## 2019-04-04 DIAGNOSIS — R072 Precordial pain: Secondary | ICD-10-CM | POA: Insufficient documentation

## 2019-04-04 DIAGNOSIS — R002 Palpitations: Secondary | ICD-10-CM | POA: Diagnosis present

## 2019-04-04 NOTE — Telephone Encounter (Signed)
Patient given detailed instructions per Myocardial Perfusion Study Information Sheet for the test on 04/08/19 at 8:15. Patient notified to arrive 15 minutes early and that it is imperative to arrive on time for appointment to keep from having the test rescheduled.  If you need to cancel or reschedule your appointment, please call the office within 24 hours of your appointment. . Patient verbalized understanding.Kimberly Gay

## 2019-04-04 NOTE — Progress Notes (Signed)
  Echocardiogram 2D Echocardiogram has been performed.  Kimberly Gay 04/04/2019, 3:36 PM

## 2019-04-08 ENCOUNTER — Other Ambulatory Visit: Payer: Self-pay

## 2019-04-08 ENCOUNTER — Ambulatory Visit (HOSPITAL_COMMUNITY): Payer: Medicare Other | Attending: Cardiovascular Disease

## 2019-04-08 VITALS — Ht 64.5 in | Wt 186.0 lb

## 2019-04-08 DIAGNOSIS — R0609 Other forms of dyspnea: Secondary | ICD-10-CM | POA: Insufficient documentation

## 2019-04-08 DIAGNOSIS — R002 Palpitations: Secondary | ICD-10-CM | POA: Diagnosis present

## 2019-04-08 DIAGNOSIS — R072 Precordial pain: Secondary | ICD-10-CM

## 2019-04-08 DIAGNOSIS — R0600 Dyspnea, unspecified: Secondary | ICD-10-CM

## 2019-04-08 LAB — MYOCARDIAL PERFUSION IMAGING
LV dias vol: 69 mL (ref 46–106)
LV sys vol: 25 mL
Peak HR: 93 {beats}/min
Rest HR: 51 {beats}/min
SDS: 1
SRS: 0
SSS: 1
TID: 0.94

## 2019-04-08 MED ORDER — TECHNETIUM TC 99M TETROFOSMIN IV KIT
31.0000 | PACK | Freq: Once | INTRAVENOUS | Status: AC | PRN
  Administered 2019-04-08: 31 via INTRAVENOUS
  Filled 2019-04-08: qty 31

## 2019-04-08 MED ORDER — TECHNETIUM TC 99M TETROFOSMIN IV KIT
11.0000 | PACK | Freq: Once | INTRAVENOUS | Status: AC | PRN
  Administered 2019-04-08: 11 via INTRAVENOUS
  Filled 2019-04-08: qty 11

## 2019-04-08 MED ORDER — REGADENOSON 0.4 MG/5ML IV SOLN
0.4000 mg | Freq: Once | INTRAVENOUS | Status: AC
  Administered 2019-04-08: 0.4 mg via INTRAVENOUS

## 2019-04-26 ENCOUNTER — Ambulatory Visit (INDEPENDENT_AMBULATORY_CARE_PROVIDER_SITE_OTHER): Payer: Medicare Other | Admitting: Cardiology

## 2019-04-26 ENCOUNTER — Other Ambulatory Visit: Payer: Self-pay

## 2019-04-26 ENCOUNTER — Encounter: Payer: Self-pay | Admitting: Cardiology

## 2019-04-26 VITALS — BP 120/68 | HR 64 | Ht 64.5 in | Wt 184.0 lb

## 2019-04-26 DIAGNOSIS — R0600 Dyspnea, unspecified: Secondary | ICD-10-CM

## 2019-04-26 DIAGNOSIS — R072 Precordial pain: Secondary | ICD-10-CM

## 2019-04-26 DIAGNOSIS — I671 Cerebral aneurysm, nonruptured: Secondary | ICD-10-CM | POA: Diagnosis not present

## 2019-04-26 DIAGNOSIS — R0609 Other forms of dyspnea: Secondary | ICD-10-CM

## 2019-04-26 DIAGNOSIS — R002 Palpitations: Secondary | ICD-10-CM | POA: Diagnosis not present

## 2019-04-26 NOTE — Progress Notes (Signed)
Cardiology Office Note:    Date:  04/26/2019   ID:  Kimberly Gay, DOB July 20, 1941, MRN [id number]  PCP:  Aretta Nip, MD  Cardiologist:  Jenne Campus, MD    Referring MD: Aretta Nip, MD   Chief Complaint  Patient presents with  . 1 monh follow up  Doing better  History of Present Illness:    Kimberly Gay is a 78 y.o. female came to [place] initially because of atypical symptoms some atypical chest pain as well as shortness of breath.  Also some symptoms suggesting paroxysmal nocturnal dyspnea overall improved does not wake up in the middle of the night gasping for air.  Does not have any more palpitations when she wakes up in the middle of the night still complain of having hoarseness.  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  She read a lot about this and she thinks that maybe her symptoms are related to the aortic enlargement.  I told her I doubt very much with this size of the aorta should not create a problem however I think it would be reasonable to perform a CT of her chest to look at the entire aorta in her chest.  She agreed with this on top of that she still get some GI symptoms that are not relieved with proton pump inhibitor I think she can benefit from seeing stomach specialist.  Past Medical History:  Diagnosis Date  . Cancer ([place]) 1994   colon-resection  . Cerebral aneurysm, nonruptured 2010   not treated due to location-monitor yearly  . GERD (gastroesophageal reflux disease)    infrequent  . Hypertension    well controlled  . Incontinence of urine   . Joint pain, knee   . Urgency of urination     Past Surgical History:  Procedure Laterality Date  . ABDOMINAL HYSTERECTOMY    . ANEURYSM COILING  2010   cerebral coiling-no deficits  . BREAST EXCISIONAL BIOPSY Left 04/05/1994  . CHOLECYSTECTOMY    .  CYSTOSCOPY  09/03/2012   Procedure: CYSTOSCOPY;  Surgeon: Ailene Rud, MD;  Location: [facility];  Service: Urology;  Laterality: N/A;  . FLEXIBLE SIGMOIDOSCOPY N/A 11/24/2014   Procedure: FLEXIBLE SIGMOIDOSCOPY;  Surgeon: Garlan Fair, MD;  Location: [facility];  Service: Endoscopy;  Laterality: N/A;  . LAMINECTOMY    . LYSIS OF ADHESION  2000's   with cholecystectomy procedure  . PILONIDAL CYST EXCISION    . PUBOVAGINAL SLING  09/03/2012   Procedure: Gaynelle Arabian;  Surgeon: Ailene Rud, MD;  Location: [facility];  Service: Urology;  Laterality: N/A;  LYNX SLING     Current Medications: Current Meds  Medication Sig  . acetaminophen (TYLENOL) 500 MG tablet Take 500 mg by mouth every 6 (six) hours as needed for mild pain.   Marland Kitchen aspirin EC 81 MG tablet Take 81 mg by mouth daily.  Marland Kitchen atenolol (TENORMIN) 50 MG tablet Take 50 mg by mouth daily.  . cetirizine (ZYRTEC) 10 MG tablet Take 10 mg by mouth daily.  . cholecalciferol (VITAMIN D) 1000 UNITS tablet Take 2,000 Units by mouth daily.   Marland Kitchen EPINEPHrine (EPIPEN) 0.3 mg/0.3 mL SOAJ injection Inject 0.3 mLs (0.3 mg total) into the muscle once.  . furosemide (LASIX) 20 MG tablet Take 0.5 tablets by mouth daily.  . nitroGLYCERIN (NITROSTAT)  0.4 MG SL tablet Place 1 tablet (0.4 mg total) under the tongue every 5 (five) minutes as needed for chest pain.     Allergies:   Amoxicillin, Meperidine and related, and Wasp venom   Social History   Socioeconomic History  . Marital status: Widowed    Spouse name: Not on file  . Number of children: Not on file  . Years of education: Not on file  . Highest education level: Not on file  Occupational History  . Not on file  Social Needs  . Financial resource strain: Not on file  . Food insecurity    Worry: Not on file    Inability: Not on file  . Transportation needs    Medical: Not on file    Non-medical: Not on file  Tobacco Use  . Smoking  status: Never Smoker  . Smokeless tobacco: Never Used  Substance and Sexual Activity  . Alcohol use: No  . Drug use: No  . Sexual activity: Not on file  Lifestyle  . Physical activity    Days per week: Not on file    Minutes per session: Not on file  . Stress: Not on file  Relationships  . Social Herbalist on phone: Not on file    Gets together: Not on file    Attends religious service: Not on file    Active member of club or organization: Not on file    Attends meetings of clubs or organizations: Not on file    Relationship status: Not on file  Other Topics Concern  . Not on file  Social History Narrative  . Not on file     Family History: The patient's family history includes Breast cancer in her cousin; Diabetes in an other family member; Heart attack in her father; Seizures in her mother. ROS:   Please see the history of present illness.    All 14 point review of systems negative except as described per history of present illness  EKGs/Labs/Other Studies Reviewed:      Recent Labs: No results found for requested labs within last 8760 hours.  Recent Lipid Panel No results found for: CHOL, TRIG, HDL, CHOLHDL, VLDL, LDLCALC, LDLDIRECT  Physical Exam:    VS:  BP 120/68   Pulse 64   Ht 5' 4.5" (1.638 m)   Wt 184 lb (83.5 kg)   SpO2 98%   BMI 31.10 kg/m     Wt Readings from Last 3 Encounters:  04/26/19 184 lb (83.5 kg)  04/08/19 186 lb (84.4 kg)  04/03/19 186 lb 12.8 oz (84.7 kg)     GEN:  Well nourished, well developed in no acute distress HEENT: Normal NECK: No JVD; No carotid bruits LYMPHATICS: No lymphadenopathy CARDIAC: RRR, no murmurs, no rubs, no gallops RESPIRATORY:  Clear to auscultation without rales, wheezing or rhonchi  ABDOMEN: Soft, non-tender, non-distended MUSCULOSKELETAL:  No edema; No deformity  SKIN: Warm and dry LOWER EXTREMITIES: no swelling NEUROLOGIC:  Alert and oriented x 3 PSYCHIATRIC:  Normal affect   ASSESSMENT:     1. Dyspnea on exertion   2. Palpitations   3. Precordial chest pain   4. Brain aneurysm    PLAN:    In order of problems listed above:  1. Dyspnea on exertion I do not see cardiac reasons for that stress test negative echocardiogram did not show any significant pathology. 2. Palpitations denies having any 3. Precordial chest pain.  Stress test negative 4. History of  brain aneurysm.  Noted 5. I think she need to see GI specialist probably endoscopy need to be done.  I will schedule her to have CT of her chest with contrast.   Medication Adjustments/Labs and Tests Ordered: Current medicines are reviewed at length with the patient today.  Concerns regarding medicines are outlined above.  No orders of the defined types were placed in this encounter.  Medication changes: No orders of the defined types were placed in this encounter.   Signed, Park Liter, MD, [facility] 04/26/2019 3:59 PM    [place]

## 2019-04-26 NOTE — Patient Instructions (Addendum)
Medication Instructions:  Your physician recommends that you continue on your current medications as directed. Please refer to the Current Medication list given to you today.  If you need a refill on your cardiac medications before your next appointment, please call your pharmacy.   Lab work: Your physician recommends that you return for lab work 3-7 days before ct : BMP   If you have labs (blood work) drawn today and your tests are completely normal, you will receive your results only by: Marland Kitchen MyChart Message (if you have MyChart) OR . A paper copy in the mail If you have any lab test that is abnormal or we need to change your treatment, we will call you to review the results.  Testing/Procedures: Non-Cardiac CT scanning, (CAT scanning), is a noninvasive, special x-ray that produces cross-sectional images of the body using x-rays and a computer. CT scans help physicians diagnose and treat medical conditions. For some CT exams, a contrast material is used to enhance visibility in the area of the body being studied. CT scans provide greater clarity and reveal more details than regular x-ray exams.     Follow-Up: At [facility], you and your health needs are our priority.  As part of our continuing mission to provide you with exceptional heart care, we have created designated Provider Care Teams.  These Care Teams include your primary Cardiologist (physician) and Advanced Practice Providers (APPs -  Physician Assistants and Nurse Practitioners) who all work together to provide you with the care you need, when you need it. You will need a follow up appointment in 2 months.  Please call our office 2 months in advance to schedule this appointment.  You may see No primary care provider on file. or another member of our [employer] Provider Team in [place]: Shirlee More, MD . Jyl Heinz, MD  Any Other Special Instructions Will Be Listed Below (If Applicable).    CT Angiogram  A CT  angiogram is a procedure to look at the blood vessels in various areas of the body. For this procedure, a large X-ray machine, called a CT scanner, takes detailed pictures of blood vessels that have been injected with a dye (contrast material). A CT angiogram allows your health care provider to see how well blood is flowing to the area of your body that is being checked. Your health care provider will be able to see if there are any problems, such as a blockage. Tell a health care provider about:  Any allergies you have.  All medicines you are taking, including vitamins, herbs, eye drops, creams, and over-the-counter medicines.  Any problems you or family members have had with anesthetic medicines.  Any blood disorders you have.  Any surgeries you have had.  Any medical conditions you have.  Whether you are pregnant or may be pregnant.  Whether you are breastfeeding.  Any anxiety disorders, chronic pain, or other conditions you have that may increase your stress or prevent you from lying still. What are the risks? Generally, this is a safe procedure. However, problems may occur, including:  Infection.  Bleeding.  Allergic reactions to medicines or dyes.  Damage to other structures or organs.  Kidney damage from the dye or contrast that is used.  Increased risk of cancer from radiation exposure. This risk is low. Talk with your health care provider about: ? The risks and benefits of testing. ? How you can receive the lowest dose of radiation. What happens before the procedure?  Wear comfortable clothing and remove any jewelry.  Follow instructions from your health care provider about eating and drinking. For most people, instructions may include these actions: ? For 12 hours before the test, avoid caffeine. This includes tea, coffee, soda, and energy drinks or pills. ? For 3-4 hours before the test, stop eating or drinking anything but water. ? Stay well hydrated by  continuing to drink water before the exam. This will help to clear the contrast dye from your body after the test.  Ask your health care provider about changing or stopping your regular medicines. This is especially important if you are taking diabetes medicines or blood thinners. What happens during the procedure?  An IV tube will be inserted into one of your veins.  You will be asked to lie on an exam table. This table will slide in and out of the CT machine during the procedure.  Contrast dye will be injected into the IV tube. You might feel warm, or you may get a metallic taste in your mouth.  The table that you are lying on will move into the CT machine tunnel for the scan.  The person running the machine will give you instructions while the scans are being done. You may be asked to: ? Keep your arms above your head. ? Hold your breath. ? Stay very still, even if the table is moving.  When the scanning is complete, you will be moved out of the machine.  The IV tube will be removed. The procedure may vary among health care providers and hospitals. What happens after the procedure?  You might feel warm, or you may get a metallic taste in your mouth.  You may be asked to drink water or other fluids to wash (flush) the contrast material out of your body.  It is up to you to get the results of your procedure. Ask your health care provider, or the department that is doing the procedure, when your results will be ready. Summary  A CT angiogram is a procedure to look at the blood vessels in various areas of the body.  You will need to stay very still during the exam.  You may be asked to drink water or other fluids to wash (flush) the contrast material out of your body after your scan. This information is not intended to replace advice given to you by your health care provider. Make sure you discuss any questions you have with your health care provider. Document Released: 03/24/2016  Document Revised: 10/04/2018 Document Reviewed: 03/24/2016 Elsevier Patient Education  2020 [employer].

## 2019-04-26 NOTE — Addendum Note (Signed)
Addended by: Ashok Norris on: 04/26/2019 04:15 PM   Modules accepted: Orders

## 2019-04-30 LAB — BASIC METABOLIC PANEL
BUN/Creatinine Ratio: 18 (ref 12–28)
BUN: 14 mg/dL (ref 8–27)
CO2: 24 mmol/L (ref 20–29)
Calcium: 10.4 mg/dL — ABNORMAL HIGH (ref 8.7–10.3)
Chloride: 104 mmol/L (ref 96–106)
Creatinine, Ser: 0.8 mg/dL (ref 0.57–1.00)
GFR calc Af Amer: 82 mL/min/{1.73_m2} (ref >59)
GFR calc non Af Amer: 71 mL/min/{1.73_m2} (ref >59)
Glucose: 88 mg/dL (ref 65–99)
Potassium: 4.2 mmol/L (ref 3.5–5.2)
Sodium: 142 mmol/L (ref 134–144)

## 2019-05-03 ENCOUNTER — Encounter (HOSPITAL_BASED_OUTPATIENT_CLINIC_OR_DEPARTMENT_OTHER): Payer: Self-pay

## 2019-05-03 ENCOUNTER — Other Ambulatory Visit: Payer: Self-pay

## 2019-05-03 ENCOUNTER — Ambulatory Visit (HOSPITAL_BASED_OUTPATIENT_CLINIC_OR_DEPARTMENT_OTHER)
Admission: RE | Admit: 2019-05-03 | Discharge: 2019-05-03 | Disposition: A | Discharge status: Home / Self Care | Payer: Medicare Other | Source: Ambulatory Visit | Attending: Cardiology | Admitting: Cardiology

## 2019-05-03 DIAGNOSIS — R072 Precordial pain: Secondary | ICD-10-CM | POA: Diagnosis present

## 2019-05-03 IMAGING — CT CT ANGIO CHEST
3 of 9 series · 17 of 46 positions shown · IV contrast (APPLIED)
Comparison: None.

CLINICAL DATA: 78-year-old female with enlarged aorta noted on
recent echocardiogram.

EXAM:
CT ANGIOGRAPHY CHEST WITH CONTRAST
TECHNIQUE: Multidetector CT imaging of the chest was performed using the
standard protocol during bolus administration of intravenous
contrast. Multiplanar CT image reconstructions and MIPs were
obtained to evaluate the vascular anatomy.
CONTRAST:  100mL OMNIPAQUE IOHEXOL 350 MG/ML SOLN

[Series 5: axial arterial · axial · arterial · 0.71mm/px · z∈[+1104,+1344]mm · 12 of 96 slices shown]
[im 8/96  lung]
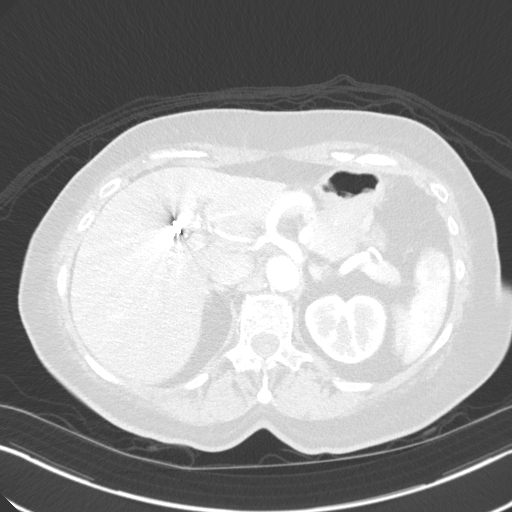
[im 15/96  soft-tissue]
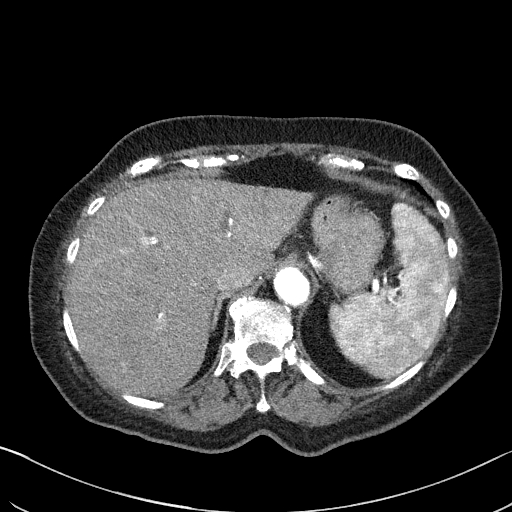
[im 22/96  lung]
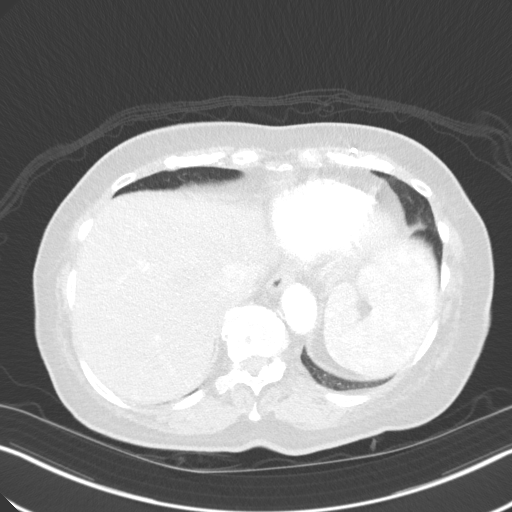
[im 30/96  soft-tissue]
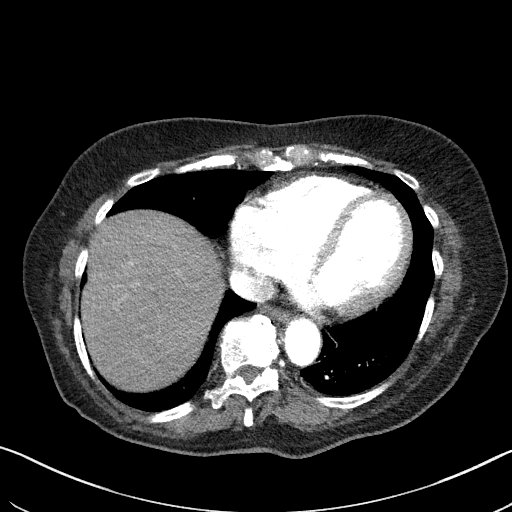
[im 37/96  lung]
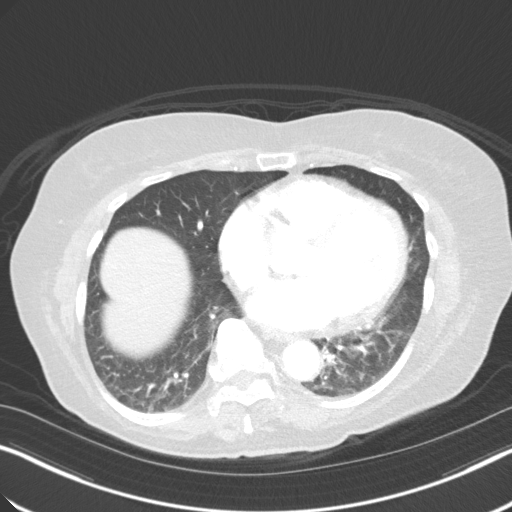
[im 44/96  soft-tissue]
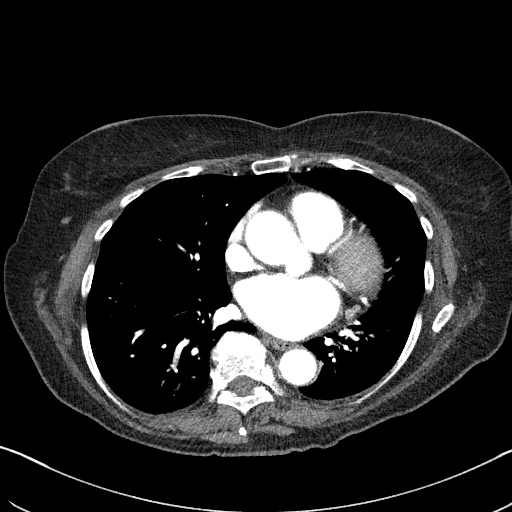
[im 52/96  lung]
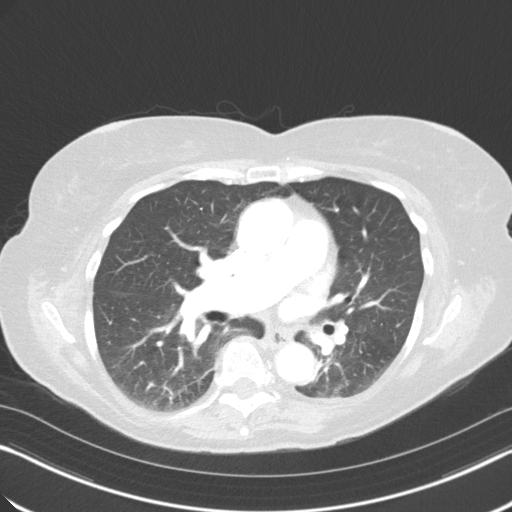
[im 59/96  soft-tissue]
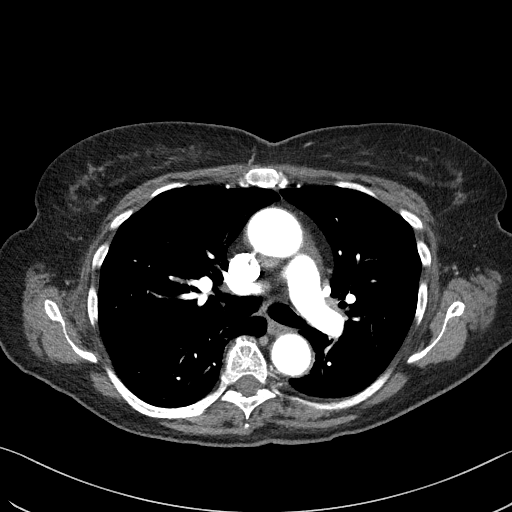
[im 66/96  lung]
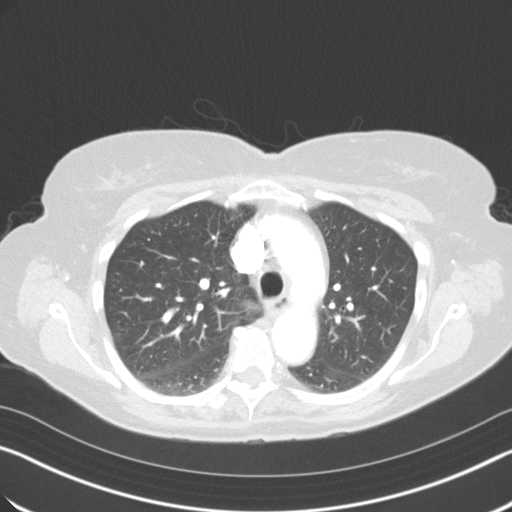
[im 74/96  soft-tissue]
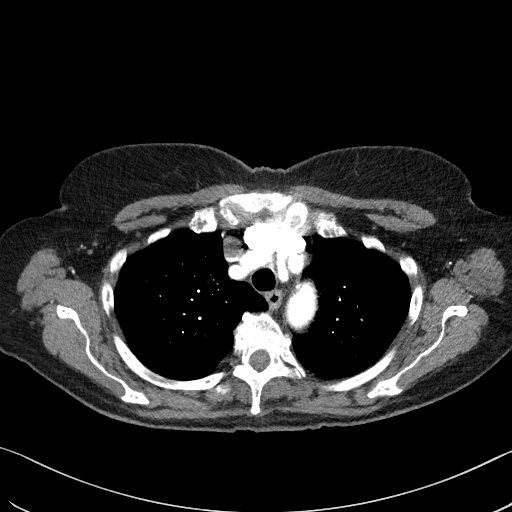
[im 81/96  lung]
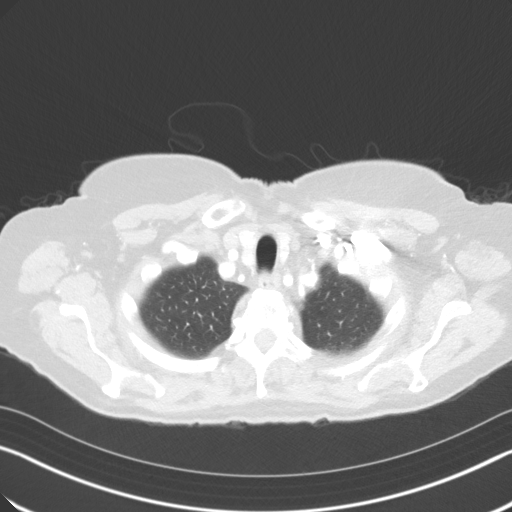
[im 88/96  soft-tissue]
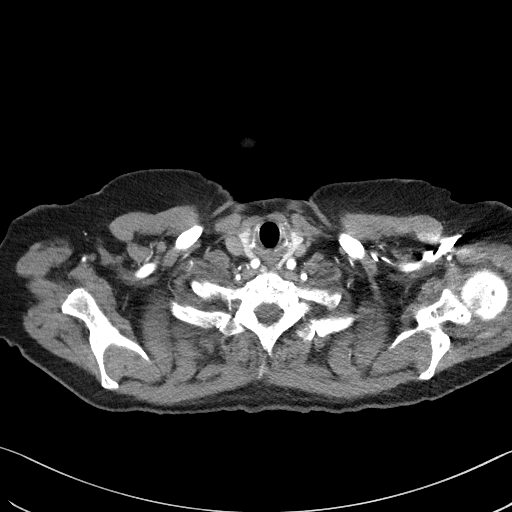

[Series 6: lung · axial · 0.71mm/px · z∈[+1123,+1163]mm · 2 of 58 slices shown]
[im 9/58  soft-tissue]
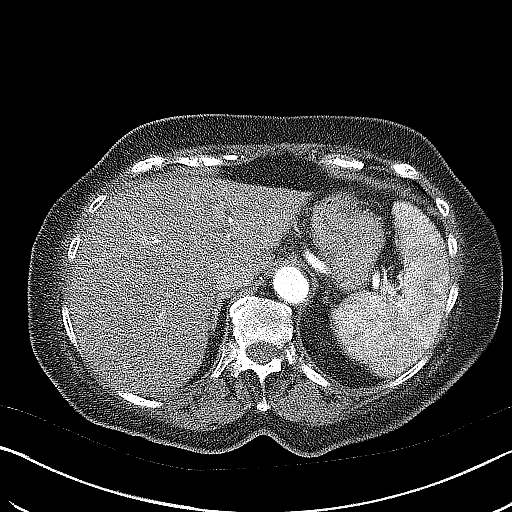
[im 17/58  soft-tissue]
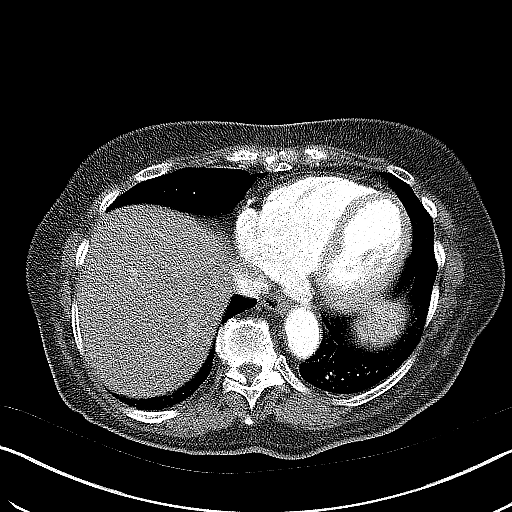

[Series 8: coronals · coronal · 0.58mm/px · 3 of 120 slices shown]
[im 30/120  soft-tissue]
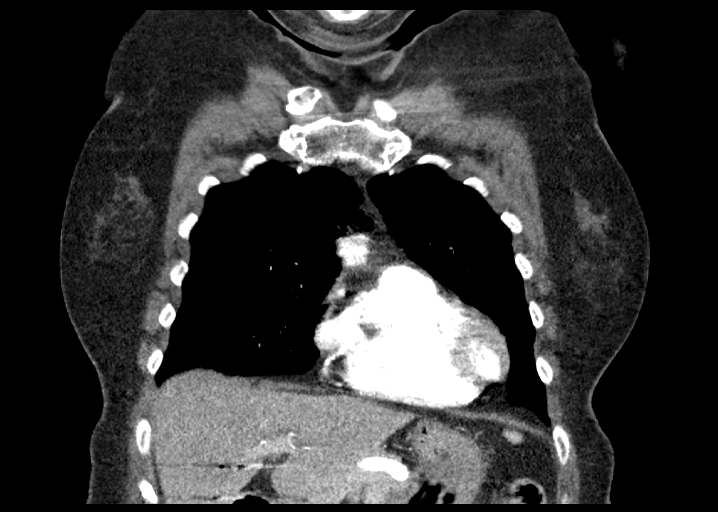
[im 60/120  soft-tissue]
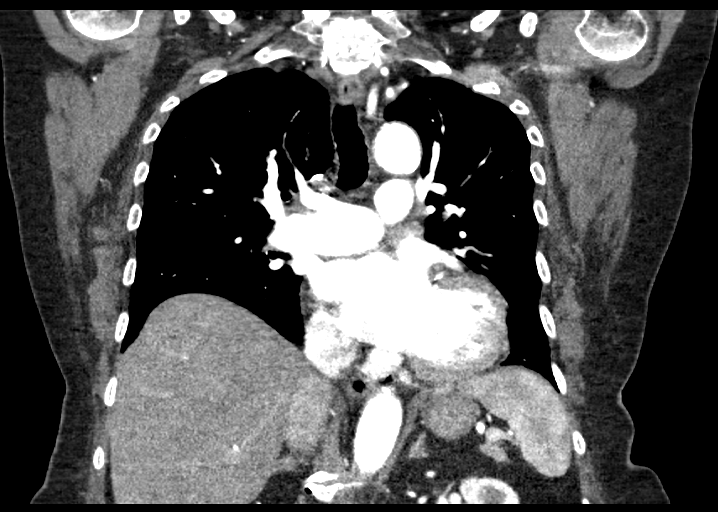
[im 90/120  soft-tissue]
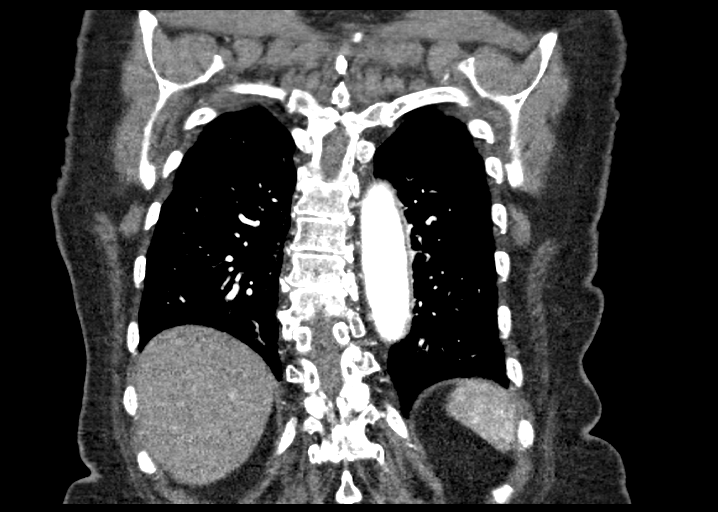

[17 of 46 positions shown; findings below may reference images not displayed]

FINDINGS: Cardiovascular: The aortic root is normal in caliber. Moderate
cardiac motion artifact is present resulting in blurring of the
ascending thoracic aorta. This limits the ability to measure
precisely. The maximal aortic diameter is 3.5 cm. Conventional 3
vessel arch anatomy. Mild atherosclerotic calcifications along the
aortic arch and infundibulum. Calcifications are visualized along
the coronary arteries. The heart is at the upper limits of normal
for size. No pericardial effusion.

Mediastinum/Nodes: Unremarkable CT appearance of the thyroid gland.
No suspicious mediastinal or hilar adenopathy. No soft tissue
mediastinal mass. The thoracic esophagus is unremarkable.

Lungs/Pleura: Lungs are clear. No pleural effusion or pneumothorax.

Upper Abdomen: No acute abnormality within the visualized upper
abdomen. A 0.9 cm circumscribed low-attenuation lesion is present in
hepatic segment 2. While too small to characterize definitively,
this is statistically highly likely a benign cyst. Surgical changes
of prior cholecystectomy are incompletely imaged.

Musculoskeletal: No acute fracture or aggressive appearing lytic or
blastic osseous lesion.

Review of the MIP images confirms the above findings.
IMPRESSION: 1. Ectatic, but nonaneurysmal ascending thoracic aorta with a
maximal diameter of approximately 3.5 cm. More precise measurement
is limited secondary to blurring of the aortic contours from cardiac
motion related artifact. The estimated measurement is likely at the
higher end of the possible range, I did do not believe that the
aorta is larger than 3.5 cm.
2. Normal aortic root without evidence of dilation.
3. Coronary artery calcifications.
4. Borderline cardiomegaly.
5.  Aortic Atherosclerosis ([7A]-170.0)

## 2019-05-03 MED ORDER — IOHEXOL 350 MG/ML SOLN
100.0000 mL | Freq: Once | INTRAVENOUS | Status: AC | PRN
  Administered 2019-05-03: 100 mL via INTRAVENOUS

## 2019-06-26 ENCOUNTER — Other Ambulatory Visit: Payer: Self-pay

## 2019-06-26 ENCOUNTER — Ambulatory Visit (INDEPENDENT_AMBULATORY_CARE_PROVIDER_SITE_OTHER): Payer: Medicare Other | Admitting: Cardiology

## 2019-06-26 ENCOUNTER — Encounter: Payer: Self-pay | Admitting: Cardiology

## 2019-06-26 VITALS — BP 118/64 | HR 62 | Ht 64.5 in | Wt 182.0 lb

## 2019-06-26 DIAGNOSIS — R002 Palpitations: Secondary | ICD-10-CM | POA: Diagnosis not present

## 2019-06-26 DIAGNOSIS — R0609 Other forms of dyspnea: Secondary | ICD-10-CM

## 2019-06-26 DIAGNOSIS — R072 Precordial pain: Secondary | ICD-10-CM | POA: Diagnosis not present

## 2019-06-26 DIAGNOSIS — R0600 Dyspnea, unspecified: Secondary | ICD-10-CM

## 2019-06-26 MED ORDER — METOPROLOL SUCCINATE ER 50 MG PO TB24: 50.0000 mg | ORAL_TABLET | Freq: Every day | ORAL | 1 refills | Status: DC

## 2019-06-26 NOTE — Addendum Note (Signed)
Addended by: Ashok Norris on: 06/26/2019 03:01 PM   Modules accepted: Orders

## 2019-06-26 NOTE — Progress Notes (Signed)
Cardiology Office Note:    Date:  06/26/2019   ID:  [name], DOB [birth year], MRN [id number]  PCP:  Aretta Nip, MD  Cardiologist:  Jenne Campus, MD    Referring MD: Aretta Nip, MD   Chief Complaint  Patient presents with  . Follow-up    History of Present Illness:    Kimberly Gay is a 78 y.o. female with past medical history significant palpitations, essential hypertension, dyspnea on exertion.  Quite extensive evaluation has been done which included stress test which was negative, echocardiogram showing preserved ejection fraction.  She was also identified to have enlarged aorta, that was confirmed with CTA of her chest aorta was measuring 3.5 cm.  She described to still have some palpitations especially in the afternoon she will feel her heart pounding.  Past Medical History:  Diagnosis Date  . Cancer ([place]) 1994   colon-resection  . Cerebral aneurysm, nonruptured 2010   not treated due to location-monitor yearly  . GERD (gastroesophageal reflux disease)    infrequent  . Hypertension    well controlled  . Incontinence of urine   . Joint pain, knee   . Urgency of urination     Past Surgical History:  Procedure Laterality Date  . ABDOMINAL HYSTERECTOMY    . ANEURYSM COILING  2010   cerebral coiling-no deficits  . BREAST EXCISIONAL BIOPSY Left 04/05/1994  . CHOLECYSTECTOMY    . CYSTOSCOPY  09/03/2012   Procedure: CYSTOSCOPY;  Surgeon: Ailene Rud, MD;  Location: [facility];  Service: Urology;  Laterality: N/A;  . FLEXIBLE SIGMOIDOSCOPY N/A 11/24/2014   Procedure: FLEXIBLE SIGMOIDOSCOPY;  Surgeon: Garlan Fair, MD;  Location: [facility];  Service: Endoscopy;  Laterality: N/A;  . LAMINECTOMY    . LYSIS OF ADHESION  2000's   with cholecystectomy procedure  . PILONIDAL CYST EXCISION    . PUBOVAGINAL SLING  09/03/2012   Procedure: Gaynelle Arabian;  Surgeon: Ailene Rud, MD;  [facility];  Service: Urology;  Laterality: N/A;  LYNX SLING     Current Medications: Current Meds  Medication Sig  . acetaminophen (TYLENOL) 500 MG tablet Take 500 mg by mouth every 6 (six) hours as needed for mild pain.   Marland Kitchen aspirin EC 81 MG tablet Take 81 mg by mouth daily.  Marland Kitchen atenolol (TENORMIN) 50 MG tablet Take 50 mg by mouth daily.  . cetirizine (ZYRTEC) 10 MG tablet Take 10 mg by mouth daily.  . cholecalciferol (VITAMIN D) 1000 UNITS tablet Take 2,000 Units by mouth daily.   Marland Kitchen EPINEPHrine (EPIPEN) 0.3 mg/0.3 mL SOAJ injection Inject 0.3 mLs (0.3 mg total) into the muscle once.  . furosemide (LASIX) 20 MG tablet Take 20 mg by mouth daily.   . nitroGLYCERIN (NITROSTAT) 0.4 MG SL tablet Place 1 tablet (0.4 mg total) under the tongue every 5 (five) minutes as needed for chest pain.     Allergies:   Amoxicillin, Meperidine and related, and Wasp venom   Social History   Socioeconomic History  . Marital status: Widowed    Spouse name: Not on file  . Number of children: Not on file  . Years of education: Not on file  . Highest education level: Not on file  Occupational History  . Not on file  Social Needs  . Financial resource strain: Not on file  . Food insecurity    Worry: Not on file    Inability: Not on file  .  Transportation needs    Medical: Not on file    Non-medical: Not on file  Tobacco Use  . Smoking status: Never Smoker  . Smokeless tobacco: Never Used  Substance and Sexual Activity  . Alcohol use: No  . Drug use: No  . Sexual activity: Not on file  Lifestyle  . Physical activity    Days per week: Not on file    Minutes per session: Not on file  . Stress: Not on file  Relationships  . Social Herbalist on phone: Not on file    Gets together: Not on file    Attends religious service: Not on file    Active member of club or organization: Not on file    Attends meetings of clubs or organizations: Not on file    Relationship status: Not on file   Other Topics Concern  . Not on file  Social History Narrative  . Not on file     Family History: The patient's family history includes Breast cancer in her cousin; Diabetes in an other family member; Heart attack in her father; Seizures in her mother. ROS:   Please see the history of present illness.    All 14 point review of systems negative except as described per history of present illness  EKGs/Labs/Other Studies Reviewed:      Recent Labs: 04/29/2019: BUN 14; Creatinine, Ser 0.80; Potassium 4.2; Sodium 142  Recent Lipid Panel No results found for: CHOL, TRIG, HDL, CHOLHDL, VLDL, LDLCALC, LDLDIRECT  Physical Exam:    VS:  BP 118/64   Pulse 62   Ht 5' 4.5" (1.638 m)   Wt 182 lb (82.6 kg)   SpO2 98%   BMI 30.76 kg/m     Wt Readings from Last 3 Encounters:  06/26/19 182 lb (82.6 kg)  04/26/19 184 lb (83.5 kg)  04/08/19 186 lb (84.4 kg)     GEN:  Well nourished, well developed in no acute distress HEENT: Normal NECK: No JVD; No carotid bruits LYMPHATICS: No lymphadenopathy CARDIAC: RRR, no murmurs, no rubs, no gallops RESPIRATORY:  Clear to auscultation without rales, wheezing or rhonchi  ABDOMEN: Soft, non-tender, non-distended MUSCULOSKELETAL:  No edema; No deformity  SKIN: Warm and dry LOWER EXTREMITIES: no swelling NEUROLOGIC:  Alert and oriented x 3 PSYCHIATRIC:  Normal affect   ASSESSMENT:    1. Precordial chest pain   2. Palpitations   3. Dyspnea on exertion    PLAN:    In order of problems listed above:  1. Precordial chest pain atypical stress test negative she does have clearly some GI issue.  She does have a heartburn she is scheduled to see GI specialist with anticipation of need to have gastroscopy. 2. Palpitations asked her to stop atenolol and I put her on metoprolol succinate 50 mg daily. 3. Dyspnea on exertion doing well from that point review so far cardiac test negative.  There is no cardiac reason for her shortness of breath.    Medication Adjustments/Labs and Tests Ordered: Current medicines are reviewed at length with the patient today.  Concerns regarding medicines are outlined above.  No orders of the defined types were placed in this encounter.  Medication changes: No orders of the defined types were placed in this encounter.   Signed, Park Liter, MD, [facility] 06/26/2019 2:54 PM    [place]

## 2019-06-26 NOTE — Patient Instructions (Addendum)
Medication Instructions:  Your physician has recommended you make the following change in your medication:   STOP: Atenolol  START: Metoprolol Succinate 50 mg daily    *If you need a refill on your cardiac medications before your next appointment, please call your pharmacy*  Lab Work: None.  If you have labs (blood work) drawn today and your tests are completely normal, you will receive your results only by: Marland Kitchen MyChart Message (if you have MyChart) OR . A paper copy in the mail If you have any lab test that is abnormal or we need to change your treatment, we will call you to review the results.  Testing/Procedures: None.   Follow-Up: At [facility], you and your health needs are our priority.  As part of our continuing mission to provide you with exceptional heart care, we have created designated Provider Care Teams.  These Care Teams include your primary Cardiologist (physician) and Advanced Practice Providers (APPs -  Physician Assistants and Nurse Practitioners) who all work together to provide you with the care you need, when you need it.  Your next appointment:   4 month(s)  The format for your next appointment:   In Person  Provider:   You may see Dr. Agustin Cree or the following Advanced Practice Provider on your designated Care Team:    Laurann Montana, FNP   Other Instructions  Metoprolol extended-release tablets What is this medicine? METOPROLOL (me TOE proe lole) is a beta-blocker. Beta-blockers reduce the workload on the heart and help it to beat more regularly. This medicine is used to treat high blood pressure and to prevent chest pain. It is also used to after a heart attack and to prevent an additional heart attack from occurring. This medicine may be used for other purposes; ask your health care provider or pharmacist if you have questions. COMMON BRAND NAME(S): toprol, Toprol XL What should I tell my health care provider before I take this medicine? They  need to know if you have any of these conditions:  diabetes  heart or vessel disease like slow heart rate, worsening heart failure, heart block, sick sinus syndrome or Raynaud's disease  kidney disease  liver disease  lung or breathing disease, like asthma or emphysema  pheochromocytoma  thyroid disease  an unusual or allergic reaction to metoprolol, other beta-blockers, medicines, foods, dyes, or preservatives  pregnant or trying to get pregnant  breast-feeding How should I use this medicine? Take this medicine by mouth with a glass of water. Follow the directions on the prescription label. Do not crush or chew. Take this medicine with or immediately after meals. Take your doses at regular intervals. Do not take more medicine than directed. Do not stop taking this medicine suddenly. This could lead to serious heart-related effects. Talk to your pediatrician regarding the use of this medicine in children. While this drug may be prescribed for children as young as 6 years for selected conditions, precautions do apply. Overdosage: If you think you have taken too much of this medicine contact a poison control center or emergency room at once. NOTE: This medicine is only for you. Do not share this medicine with others. What if I miss a dose? If you miss a dose, take it as soon as you can. If it is almost time for your next dose, take only that dose. Do not take double or extra doses. What may interact with this medicine? This medicine may interact with the following medications:  certain medicines for blood  pressure, heart disease, irregular heart beat  certain medicines for depression, like monoamine oxidase (MAO) inhibitors, fluoxetine, or paroxetine  clonidine  dobutamine  epinephrine  isoproterenol  reserpine This list may not describe all possible interactions. Give your health care provider a list of all the medicines, herbs, non-prescription drugs, or dietary  supplements you use. Also tell them if you smoke, drink alcohol, or use illegal drugs. Some items may interact with your medicine. What should I watch for while using this medicine? Visit your doctor or health care professional for regular check ups. Contact your doctor right away if your symptoms worsen. Check your blood pressure and pulse rate regularly. Ask your health care professional what your blood pressure and pulse rate should be, and when you should contact them. You may get drowsy or dizzy. Do not drive, use machinery, or do anything that needs mental alertness until you know how this medicine affects you. Do not sit or stand up quickly, especially if you are an older patient. This reduces the risk of dizzy or fainting spells. Contact your doctor if these symptoms continue. Alcohol may interfere with the effect of this medicine. Avoid alcoholic drinks. This medicine may increase blood sugar. Ask your healthcare provider if changes in diet or medicines are needed if you have diabetes. What side effects may I notice from receiving this medicine? Side effects that you should report to your doctor or health care professional as soon as possible:  allergic reactions like skin rash, itching or hives  cold or numb hands or feet  depression  difficulty breathing  faint  fever with sore throat  irregular heartbeat, chest pain  rapid weight gain   signs and symptoms of high blood sugar such as being more thirsty or hungry or having to urinate more than normal. You may also feel very tired or have blurry vision.  swollen legs or ankles Side effects that usually do not require medical attention (report to your doctor or health care professional if they continue or are bothersome):  anxiety or nervousness  change in sex drive or performance  dry skin  headache  nightmares or trouble sleeping  short term memory loss  stomach upset or diarrhea This list may not describe all  possible side effects. Call your doctor for medical advice about side effects. You may report side effects to FDA at 1-800-FDA-1088. Where should I keep my medicine? Keep out of the reach of children. Store at room temperature between 15 and 30 degrees C (59 and 86 degrees F). Throw away any unused medicine after the expiration date. NOTE: This sheet is a summary. It may not cover all possible information. If you have questions about this medicine, talk to your doctor, pharmacist, or health care provider.  2020 Elsevier/Gold Standard (2018-05-15 11:09:41)

## 2019-10-28 ENCOUNTER — Ambulatory Visit (INDEPENDENT_AMBULATORY_CARE_PROVIDER_SITE_OTHER): Payer: Medicare PPO | Admitting: Cardiology

## 2019-10-28 ENCOUNTER — Other Ambulatory Visit: Payer: Self-pay

## 2019-10-28 ENCOUNTER — Encounter: Payer: Self-pay | Admitting: Cardiology

## 2019-10-28 VITALS — BP 120/68 | HR 99 | Ht 64.5 in | Wt 179.0 lb

## 2019-10-28 DIAGNOSIS — I671 Cerebral aneurysm, nonruptured: Secondary | ICD-10-CM | POA: Diagnosis not present

## 2019-10-28 DIAGNOSIS — R072 Precordial pain: Secondary | ICD-10-CM

## 2019-10-28 DIAGNOSIS — R0600 Dyspnea, unspecified: Secondary | ICD-10-CM | POA: Diagnosis not present

## 2019-10-28 DIAGNOSIS — I480 Paroxysmal atrial fibrillation: Secondary | ICD-10-CM

## 2019-10-28 DIAGNOSIS — R0609 Other forms of dyspnea: Secondary | ICD-10-CM

## 2019-10-28 DIAGNOSIS — I4891 Unspecified atrial fibrillation: Secondary | ICD-10-CM | POA: Insufficient documentation

## 2019-10-28 HISTORY — DX: Paroxysmal atrial fibrillation: I48.0

## 2019-10-28 NOTE — Patient Instructions (Signed)
Medication Instructions:  Your physician recommends that you continue on your current medications as directed. Please refer to the Current Medication list given to you today.  *If you need a refill on your cardiac medications before your next appointment, please call your pharmacy*   Lab Work: Your physician recommends that you return for lab work today: cbc, bmp, stool sample   If you have labs (blood work) drawn today and your tests are completely normal, you will receive your results only by: Marland Kitchen MyChart Message (if you have MyChart) OR . A paper copy in the mail If you have any lab test that is abnormal or we need to change your treatment, we will call you to review the results.   Testing/Procedures: None.    Follow-Up: At [facility], you and your health needs are our priority.  As part of our continuing mission to provide you with exceptional heart care, we have created designated Provider Care Teams.  These Care Teams include your primary Cardiologist (physician) and Advanced Practice Providers (APPs -  Physician Assistants and Nurse Practitioners) who all work together to provide you with the care you need, when you need it.  We recommend signing up for the patient portal called "MyChart".  Sign up information is provided on this After Visit Summary.  MyChart is used to connect with patients for Virtual Visits (Telemedicine).  Patients are able to view lab/test results, encounter notes, upcoming appointments, etc.  Non-urgent messages can be sent to your provider as well.   To learn more about what you can do with MyChart, go to [email or web address].    Your next appointment:   3 week(s)  The format for your next appointment:   In Person  Provider:   Jenne Campus, MD   Other Instructions  Apixaban oral tablets What is this medicine? APIXABAN (a PIX a ban) is an anticoagulant (blood thinner). It is used to lower the chance of stroke in people with a medical  condition called atrial fibrillation. It is also used to treat or prevent blood clots in the lungs or in the veins. This medicine may be used for other purposes; ask your health care provider or pharmacist if you have questions. COMMON BRAND NAME(S): Eliquis What should I tell my health care provider before I take this medicine? They need to know if you have any of these conditions:  antiphospholipid antibody syndrome  bleeding disorders  bleeding in the brain  blood in your stools (black or tarry stools) or if you have blood in your vomit  history of blood clots  history of stomach bleeding  kidney disease  liver disease  mechanical heart valve  an unusual or allergic reaction to apixaban, other medicines, foods, dyes, or preservatives  pregnant or trying to get pregnant  breast-feeding How should I use this medicine? Take this medicine by mouth with a glass of water. Follow the directions on the prescription label. You can take it with or without food. If it upsets your stomach, take it with food. Take your medicine at regular intervals. Do not take it more often than directed. Do not stop taking except on your doctor's advice. Stopping this medicine may increase your risk of a blood clot. Be sure to refill your prescription before you run out of medicine. Talk to your pediatrician regarding the use of this medicine in children. Special care may be needed. Overdosage: If you think you have taken too much of this medicine contact a poison control center or  emergency room at once. NOTE: This medicine is only for you. Do not share this medicine with others. What if I miss a dose? If you miss a dose, take it as soon as you can. If it is almost time for your next dose, take only that dose. Do not take double or extra doses. What may interact with this medicine? This medicine may interact with the following:  aspirin and aspirin-like medicines  certain medicines for fungal  infections like ketoconazole and itraconazole  certain medicines for seizures like carbamazepine and phenytoin  certain medicines that treat or prevent blood clots like warfarin, enoxaparin, and dalteparin  clarithromycin  NSAIDs, medicines for pain and inflammation, like ibuprofen or naproxen  rifampin  ritonavir  St. John's wort This list may not describe all possible interactions. Give your health care provider a list of all the medicines, herbs, non-prescription drugs, or dietary supplements you use. Also tell them if you smoke, drink alcohol, or use illegal drugs. Some items may interact with your medicine. What should I watch for while using this medicine? Visit your healthcare professional for regular checks on your progress. You may need blood work done while you are taking this medicine. Your condition will be monitored carefully while you are receiving this medicine. It is important not to miss any appointments. Avoid sports and activities that might cause injury while you are using this medicine. Severe falls or injuries can cause unseen bleeding. Be careful when using sharp tools or knives. Consider using an Copy. Take special care brushing or flossing your teeth. Report any injuries, bruising, or red spots on the skin to your healthcare professional. If you are going to need surgery or other procedure, tell your healthcare professional that you are taking this medicine. Wear a medical ID bracelet or chain. Carry a card that describes your disease and details of your medicine and dosage times. What side effects may I notice from receiving this medicine? Side effects that you should report to your doctor or health care professional as soon as possible:  allergic reactions like skin rash, itching or hives, swelling of the face, lips, or tongue  signs and symptoms of bleeding such as bloody or black, tarry stools; red or dark-brown urine; spitting up blood or brown  material that looks like coffee grounds; red spots on the skin; unusual bruising or bleeding from the eye, gums, or nose  signs and symptoms of a blood clot such as chest pain; shortness of breath; pain, swelling, or warmth in the leg  signs and symptoms of a stroke such as changes in vision; confusion; trouble speaking or understanding; severe headaches; sudden numbness or weakness of the face, arm or leg; trouble walking; dizziness; loss of coordination This list may not describe all possible side effects. Call your doctor for medical advice about side effects. You may report side effects to FDA at 1-800-FDA-1088. Where should I keep my medicine? Keep out of the reach of children. Store at room temperature between 20 and 25 degrees C (68 and 77 degrees F). Throw away any unused medicine after the expiration date. NOTE: This sheet is a summary. It may not cover all possible information. If you have questions about this medicine, talk to your doctor, pharmacist, or health care provider.  2020 Elsevier/Gold Standard (2018-04-04 17:39:34)

## 2019-10-28 NOTE — Progress Notes (Signed)
Cardiology Office Note:    Date:  10/28/2019   ID:  Kimberly Gay, DOB 30-Jan-1941, MRN [id number]  PCP:  Aretta Nip, MD  Cardiologist:  Jenne Campus, MD    Referring MD: Aretta Nip, MD   No chief complaint on file. I have palpitations today  History of Present Illness:    Kimberly Gay is a 79 y.o. female with past medical history significant for palpitations, essential hypertension, dyspnea on exertion.  Quiet extensive evaluation has been done so far which include echocardiogram showing preserved ejection fraction, stress test being negative, she was also find to have enlarged aortic.  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  She also got history of intracranial bleed with a ruptured aneurysm that was fixed about 10 years ago.  She still gets residual to small aneurysm.  Past Medical History:  Diagnosis Date  . Brain aneurysm 04/03/2019  . Cancer ([place]) 1994   colon-resection  . Cerebral aneurysm, nonruptured 2010   not treated due to location-monitor yearly  . Dyspnea on exertion 04/03/2019  . GERD (gastroesophageal reflux disease)    infrequent  . Hypertension    well controlled  . Incontinence of urine   . Joint pain, knee   . Palpitations 04/03/2019  . Precordial chest pain 04/03/2019  . Urgency of urination     Past Surgical History:  Procedure Laterality Date  . ABDOMINAL HYSTERECTOMY    . ANEURYSM COILING  2010   cerebral coiling-no deficits  . BREAST EXCISIONAL BIOPSY Left 04/05/1994  . CHOLECYSTECTOMY    . CYSTOSCOPY  09/03/2012   Procedure: CYSTOSCOPY;  Surgeon: Ailene Rud, MD;  Location: [facility];  Service: Urology;  Laterality: N/A;  . FLEXIBLE SIGMOIDOSCOPY N/A 11/24/2014   Procedure: FLEXIBLE SIGMOIDOSCOPY;  Surgeon: Garlan Fair, MD;  Location: [facility];  Service: Endoscopy;  Laterality: N/A;  . LAMINECTOMY    . LYSIS OF ADHESION  2000's   with cholecystectomy  procedure  . PILONIDAL CYST EXCISION    . PUBOVAGINAL SLING  09/03/2012   Procedure: Gaynelle Arabian;  Surgeon: Ailene Rud, MD;  Location: [facility];  Service: Urology;  Laterality: N/A;  LYNX SLING     Current Medications: Current Meds  Medication Sig  . acetaminophen (TYLENOL) 500 MG tablet Take 500 mg by mouth every 6 (six) hours as needed for mild pain.   Marland Kitchen aspirin EC 81 MG tablet Take 81 mg by mouth daily.  . cetirizine (ZYRTEC) 10 MG tablet Take 10 mg by mouth daily.  . cholecalciferol (VITAMIN D) 1000 UNITS tablet Take 2,000 Units by mouth daily.   Marland Kitchen EPINEPHrine (EPIPEN) 0.3 mg/0.3 mL SOAJ injection Inject 0.3 mLs (0.3 mg total) into the muscle once.  . furosemide (LASIX) 20 MG tablet Take 20 mg by mouth daily.   . metoprolol succinate (TOPROL-XL) 50 MG 24 hr tablet Take 1 tablet (50 mg total) by mouth daily. Take with or immediately following a meal.  . nitroGLYCERIN (NITROSTAT) 0.4 MG SL tablet Place 1 tablet (0.4 mg total) under the tongue every 5 (five) minutes as needed for chest pain.     Allergies:   Amoxicillin, Meperidine and related, and Wasp venom   Social History   Socioeconomic History  . Marital status: Widowed    Spouse name: Not on file  . Number of children: Not on file  . Years of education: Not on file  . Highest education level:  Not on file  Occupational History  . Not on file  Tobacco Use  . Smoking status: Never Smoker  . Smokeless tobacco: Never Used  Substance and Sexual Activity  . Alcohol use: No  . Drug use: No  . Sexual activity: Not on file  Other Topics Concern  . Not on file  Social History Narrative  . Not on file   Social Determinants of Health   Financial Resource Strain:   . Difficulty of Paying Living Expenses:   Food Insecurity:   . Worried About Charity fundraiser in the Last Year:   . Arboriculturist in the Last Year:   Transportation Needs:   . Film/video editor (Medical):   Marland Kitchen Lack of  Transportation (Non-Medical):   Physical Activity:   . Days of Exercise per Week:   . Minutes of Exercise per Session:   Stress:   . Feeling of Stress :   Social Connections:   . Frequency of Communication with Friends and Family:   . Frequency of Social Gatherings with Friends and Family:   . Attends Religious Services:   . Active Member of Clubs or Organizations:   . Attends Archivist Meetings:   Marland Kitchen Marital Status:      Family History: The patient's family history includes Breast cancer in her cousin; Diabetes in an other family member; Heart attack in her father; Seizures in her mother. ROS:   Please see the history of present illness.    All 14 point review of systems negative except as described per history of present illness  EKGs/Labs/Other Studies Reviewed:      Recent Labs: 04/29/2019: BUN 14; Creatinine, Ser 0.80; Potassium 4.2; Sodium 142  Recent Lipid Panel No results found for: CHOL, TRIG, HDL, CHOLHDL, VLDL, LDLCALC, LDLDIRECT  Physical Exam:    VS:  BP 120/68 (BP Location: Right Arm, Patient Position: Sitting, Cuff Size: Normal)   Pulse 99   Ht 5' 4.5" (1.638 m)   Wt 179 lb (81.2 kg)   SpO2 94%   BMI 30.25 kg/m     Wt Readings from Last 3 Encounters:  10/28/19 179 lb (81.2 kg)  06/26/19 182 lb (82.6 kg)  04/26/19 184 lb (83.5 kg)     GEN:  Well nourished, well developed in no acute distress HEENT: Normal NECK: No JVD; No carotid bruits LYMPHATICS: No lymphadenopathy CARDIAC: Irregular irregular, no murmurs, no rubs, no gallops RESPIRATORY:  Clear to auscultation without rales, wheezing or rhonchi  ABDOMEN: Soft, non-tender, non-distended MUSCULOSKELETAL:  No edema; No deformity  SKIN: Warm and dry LOWER EXTREMITIES: no swelling NEUROLOGIC:  Alert and oriented x 3 PSYCHIATRIC:  Normal affect   ASSESSMENT:    1. Brain aneurysm   2. Paroxysmal atrial fibrillation (HCC) CHA2DS2-VASc equals 4, not anticoagulated so far because of  history of intracranial bleed and aneurysm   3. Dyspnea on exertion   4. Precordial chest pain    PLAN:    In order of problems listed above:  1. Paroxysmal atrial fibrillation this is a new discovery.  Her EKG today showed the rhythm.  Her chads 2 vascular equals 4, ideally she need to be anticoagulated.  However what complicate the situation tremendously is the fact that she does have history of intracranial bleed with a ruptured aneurysm that happened 10 years ago.  Apparently she still got residual to small aneurysm.  I will contact her neurosurgeon to help me with decision about potential anticoagulation.  If  we will not be able to anticoagulate her, then watchman device will be considered. 2. Dyspnea on exertion doing well from that point review 3. Precordial chest pain denies having any.  Stress test done previously is negative.  I spoke to Dr. Clovis Riley neurosurgeon who is taking care of of central regarding anticoagulation.  He said that from his point of view should be no problem anticoagulation.  Therefore, I will check her Chem-7, stool for guaiac and then will initiate anticoagulation and then at that time aspirin will be discontinued  Medication Adjustments/Labs and Tests Ordered: Current medicines are reviewed at length with the patient today.  Concerns regarding medicines are outlined above.  No orders of the defined types were placed in this encounter.  Medication changes: No orders of the defined types were placed in this encounter.   Signed, Park Liter, MD, [facility] 10/28/2019 3:52 PM    [place]

## 2019-10-29 LAB — BASIC METABOLIC PANEL
BUN/Creatinine Ratio: 18 (ref 12–28)
BUN: 18 mg/dL (ref 8–27)
CO2: 23 mmol/L (ref 20–29)
Calcium: 10.1 mg/dL (ref 8.7–10.3)
Chloride: 104 mmol/L (ref 96–106)
Creatinine, Ser: 1.01 mg/dL — ABNORMAL HIGH (ref 0.57–1.00)
GFR calc Af Amer: 62 mL/min/{1.73_m2} (ref >59)
GFR calc non Af Amer: 53 mL/min/{1.73_m2} — ABNORMAL LOW (ref >59)
Glucose: 128 mg/dL — ABNORMAL HIGH (ref 65–99)
Potassium: 3.7 mmol/L (ref 3.5–5.2)
Sodium: 140 mmol/L (ref 134–144)

## 2019-10-29 LAB — CBC
Hematocrit: 45.9 % (ref 34.0–46.6)
Hemoglobin: 15.4 g/dL (ref 11.1–15.9)
MCH: 29.3 pg (ref 26.6–33.0)
MCHC: 33.6 g/dL (ref 31.5–35.7)
MCV: 87 fL (ref 79–97)
Platelets: 231 10*3/uL (ref 150–450)
RBC: 5.26 x10E6/uL (ref 3.77–5.28)
RDW: 12.4 % (ref 11.7–15.4)
WBC: 10.1 10*3/uL (ref 3.4–10.8)

## 2019-11-03 LAB — FECAL OCCULT BLOOD, IMMUNOCHEMICAL: Fecal Occult Bld: POSITIVE — AB

## 2019-11-19 ENCOUNTER — Ambulatory Visit: Payer: Medicare PPO | Admitting: Cardiology

## 2019-11-29 ENCOUNTER — Other Ambulatory Visit: Payer: Self-pay

## 2019-11-29 ENCOUNTER — Ambulatory Visit (INDEPENDENT_AMBULATORY_CARE_PROVIDER_SITE_OTHER): Payer: Medicare PPO | Admitting: Cardiology

## 2019-11-29 ENCOUNTER — Encounter: Payer: Self-pay | Admitting: Cardiology

## 2019-11-29 VITALS — BP 138/76 | HR 64 | Ht 65.0 in | Wt 176.0 lb

## 2019-11-29 DIAGNOSIS — R002 Palpitations: Secondary | ICD-10-CM | POA: Diagnosis not present

## 2019-11-29 DIAGNOSIS — R0600 Dyspnea, unspecified: Secondary | ICD-10-CM | POA: Diagnosis not present

## 2019-11-29 DIAGNOSIS — I480 Paroxysmal atrial fibrillation: Secondary | ICD-10-CM

## 2019-11-29 DIAGNOSIS — R0609 Other forms of dyspnea: Secondary | ICD-10-CM

## 2019-11-29 DIAGNOSIS — I671 Cerebral aneurysm, nonruptured: Secondary | ICD-10-CM | POA: Diagnosis not present

## 2019-11-29 MED ORDER — METOPROLOL SUCCINATE ER 50 MG PO TB24: 75.0000 mg | ORAL_TABLET | Freq: Every day | ORAL | 3 refills | Status: DC

## 2019-11-29 NOTE — Patient Instructions (Signed)
Medication Instructions:  Your physician recommends that you continue on your current medications as directed. Please refer to the Current Medication list given to you today.  1. Toprol was refilled at visit today   Labwork: -None  Testing/Procedures: -None  Follow-Up: Your physician recommends that you keep your scheduled  follow-up appointment in: with Dr. Fraser Din in July.  Any Other Special Instructions Will Be Listed Below (If Applicable).     If you need a refill on your cardiac medications before your next appointment, please call your pharmacy.

## 2019-11-29 NOTE — Progress Notes (Signed)
Cardiology Office Note:    Date:  11/29/2019   ID:  Kimberly Gay, DOB 07-26-1941, MRN [id number]  PCP:  Aretta Nip, MD  Cardiologist:  Jenne Campus, MD    Referring MD: Aretta Nip, MD   No chief complaint on file. Doing fine  History of Present Illness:    Kimberly Gay is a 79 y.o. female who was recently discovered to have paroxysmal atrial fibrillation.  She described some episodes.  We were talking about initiating anticoagulation however situation is quite complicated.  She does have a history of intracranial bleed with ruptured aneurysm that happened about 10 years ago.  I did talk to her neurosurgeons who told me that should be fine to anticoagulate her.  Then we reinitiated work-up for anticoagulation with find out that she got stool positive for occult blood.  She was referred to GI and we waiting for their opinion.  Overall she describes some palpitations sometimes.  Today she looks like she is in sinus rhythm but I will do EKG to confirm that.  And overall seems to be doing well.  Past Medical History:  Diagnosis Date  . Brain aneurysm 04/03/2019  . Cancer ([place]) 1994   colon-resection  . Cerebral aneurysm, nonruptured 2010   not treated due to location-monitor yearly  . Dyspnea on exertion 04/03/2019  . GERD (gastroesophageal reflux disease)    infrequent  . Hypertension    well controlled  . Incontinence of urine   . Joint pain, knee   . Palpitations 04/03/2019  . Precordial chest pain 04/03/2019  . Urgency of urination     Past Surgical History:  Procedure Laterality Date  . ABDOMINAL HYSTERECTOMY    . ANEURYSM COILING  2010   cerebral coiling-no deficits  . BREAST EXCISIONAL BIOPSY Left 04/05/1994  . CHOLECYSTECTOMY    . CYSTOSCOPY  09/03/2012   Procedure: CYSTOSCOPY;  Surgeon: Ailene Rud, MD;  Location: [facility];  Service: Urology;  Laterality: N/A;  . FLEXIBLE SIGMOIDOSCOPY N/A 11/24/2014   Procedure: FLEXIBLE  SIGMOIDOSCOPY;  Surgeon: Garlan Fair, MD;  Location: [facility];  Service: Endoscopy;  Laterality: N/A;  . LAMINECTOMY    . LYSIS OF ADHESION  2000's   with cholecystectomy procedure  . PILONIDAL CYST EXCISION    . PUBOVAGINAL SLING  09/03/2012   Procedure: Gaynelle Arabian;  Surgeon: Ailene Rud, MD;  Location: [facility];  Service: Urology;  Laterality: N/A;  LYNX SLING     Current Medications: Current Meds  Medication Sig  . acetaminophen (TYLENOL) 500 MG tablet Take 500 mg by mouth every 6 (six) hours as needed for mild pain.   Marland Kitchen aspirin EC 81 MG tablet Take 81 mg by mouth daily.  . cetirizine (ZYRTEC) 10 MG tablet Take 10 mg by mouth daily.  . cholecalciferol (VITAMIN D) 1000 UNITS tablet Take 2,000 Units by mouth daily.   Marland Kitchen EPINEPHrine (EPIPEN) 0.3 mg/0.3 mL SOAJ injection Inject 0.3 mLs (0.3 mg total) into the muscle once.  . furosemide (LASIX) 20 MG tablet Take 20 mg by mouth daily.   . metoprolol succinate (TOPROL-XL) 50 MG 24 hr tablet Take 1 tablet (50 mg total) by mouth daily. Take with or immediately following a meal.  . nitroGLYCERIN (NITROSTAT) 0.4 MG SL tablet Place 1 tablet (0.4 mg total) under the tongue every 5 (five) minutes as needed for chest pain.     Allergies:   Amoxicillin, Meperidine and related, and Wasp venom  Social History   Socioeconomic History  . Marital status: Widowed    Spouse name: Not on file  . Number of children: Not on file  . Years of education: Not on file  . Highest education level: Not on file  Occupational History  . Not on file  Tobacco Use  . Smoking status: Never Smoker  . Smokeless tobacco: Never Used  Substance and Sexual Activity  . Alcohol use: No  . Drug use: No  . Sexual activity: Not on file  Other Topics Concern  . Not on file  Social History Narrative  . Not on file   Social Determinants of Health   Financial Resource Strain:   . Difficulty of Paying Living Expenses:   Food  Insecurity:   . Worried About Charity fundraiser in the Last Year:   . Arboriculturist in the Last Year:   Transportation Needs:   . Film/video editor (Medical):   Marland Kitchen Lack of Transportation (Non-Medical):   Physical Activity:   . Days of Exercise per Week:   . Minutes of Exercise per Session:   Stress:   . Feeling of Stress :   Social Connections:   . Frequency of Communication with Friends and Family:   . Frequency of Social Gatherings with Friends and Family:   . Attends Religious Services:   . Active Member of Clubs or Organizations:   . Attends Archivist Meetings:   Marland Kitchen Marital Status:      Family History: The patient's family history includes Breast cancer in her cousin; Diabetes in an other family member; Heart attack in her father; Seizures in her mother. ROS:   Please see the history of present illness.    All 14 point review of systems negative except as described per history of present illness  EKGs/Labs/Other Studies Reviewed:      Recent Labs: 10/28/2019: BUN 18; Creatinine, Ser 1.01; Hemoglobin 15.4; Platelets 231; Potassium 3.7; Sodium 140  Recent Lipid Panel No results found for: CHOL, TRIG, HDL, CHOLHDL, VLDL, LDLCALC, LDLDIRECT  Physical Exam:    VS:  BP 138/76 (BP Location: Right Arm, Patient Position: Sitting, Cuff Size: Normal)   Pulse 64   Ht 5\' 5"  (1.651 m)   Wt 176 lb 0.6 oz (79.9 kg)   BMI 29.29 kg/m     Wt Readings from Last 3 Encounters:  11/29/19 176 lb 0.6 oz (79.9 kg)  10/28/19 179 lb (81.2 kg)  06/26/19 182 lb (82.6 kg)     GEN:  Well nourished, well developed in no acute distress HEENT: Normal NECK: No JVD; No carotid bruits LYMPHATICS: No lymphadenopathy CARDIAC: RRR, no murmurs, no rubs, no gallops RESPIRATORY:  Clear to auscultation without rales, wheezing or rhonchi  ABDOMEN: Soft, non-tender, non-distended MUSCULOSKELETAL:  No edema; No deformity  SKIN: Warm and dry LOWER EXTREMITIES: no swelling NEUROLOGIC:   Alert and oriented x 3 PSYCHIATRIC:  Normal affect   ASSESSMENT:    1. Paroxysmal atrial fibrillation (HCC) CHA2DS2-VASc equals 4, not anticoagulated so far because of history of intracranial bleed and aneurysm   2. Brain aneurysm   3. Dyspnea on exertion   4. Palpitations    PLAN:    In order of problems listed above:  1. Paroxysmal atrial fibrillation which chads 2 vascular equals 4 however not anticoagulated because of positive stool guaiac now.  Work-up being in process.  She also got history of brain aneurysm and bleeding however I spoke to her neurosurgeon  who told me that should be fine to proceed with anticoagulation. 2. Brain aneurysm followed by [facility]. 3. Dyspnea on exertion seems to be stable. 4. Palpitations.  I suspect she can paroxysmal atrial fibrillation.  Awaiting GI work-up to initiate anticoagulation.   Medication Adjustments/Labs and Tests Ordered: Current medicines are reviewed at length with the patient today.  Concerns regarding medicines are outlined above.  No orders of the defined types were placed in this encounter.  Medication changes: No orders of the defined types were placed in this encounter.   Signed, Park Liter, MD, [facility] 11/29/2019 9:39 AM    [place]

## 2019-11-29 NOTE — Addendum Note (Signed)
Addended by: Tamsen Snider on: 11/29/2019 09:53 AM   Modules accepted: Orders

## 2020-01-14 ENCOUNTER — Other Ambulatory Visit: Payer: Self-pay | Admitting: Family Medicine

## 2020-01-14 DIAGNOSIS — Z1231 Encounter for screening mammogram for malignant neoplasm of breast: Secondary | ICD-10-CM

## 2020-02-14 ENCOUNTER — Ambulatory Visit: Payer: Medicare PPO

## 2020-02-14 ENCOUNTER — Encounter: Payer: Self-pay | Admitting: Emergency Medicine

## 2020-02-14 ENCOUNTER — Ambulatory Visit: Payer: Medicare PPO | Admitting: Cardiology

## 2020-02-14 ENCOUNTER — Ambulatory Visit
Admission: RE | Admit: 2020-02-14 | Discharge: 2020-02-14 | Disposition: A | Discharge status: Home / Self Care | Payer: Medicare PPO | Source: Ambulatory Visit | Attending: Family Medicine | Admitting: Family Medicine

## 2020-02-14 ENCOUNTER — Encounter: Payer: Self-pay | Admitting: Cardiology

## 2020-02-14 ENCOUNTER — Other Ambulatory Visit: Payer: Self-pay

## 2020-02-14 VITALS — BP 132/86 | HR 71 | Ht 65.0 in | Wt 172.8 lb

## 2020-02-14 DIAGNOSIS — R0600 Dyspnea, unspecified: Secondary | ICD-10-CM

## 2020-02-14 DIAGNOSIS — I480 Paroxysmal atrial fibrillation: Secondary | ICD-10-CM

## 2020-02-14 DIAGNOSIS — R002 Palpitations: Secondary | ICD-10-CM | POA: Diagnosis not present

## 2020-02-14 DIAGNOSIS — R0609 Other forms of dyspnea: Secondary | ICD-10-CM

## 2020-02-14 DIAGNOSIS — Z1231 Encounter for screening mammogram for malignant neoplasm of breast: Secondary | ICD-10-CM | POA: Diagnosis not present

## 2020-02-14 IMAGING — MG DIGITAL SCREENING BILAT W/ TOMO W/ CAD
8 series · 8 of 24 positions shown · non-contrast
Comparison: Previous exam(s).

CLINICAL DATA: Screening.

EXAM:
DIGITAL SCREENING BILATERAL MAMMOGRAM WITH TOMO AND CAD

[L MLO synth-2D]
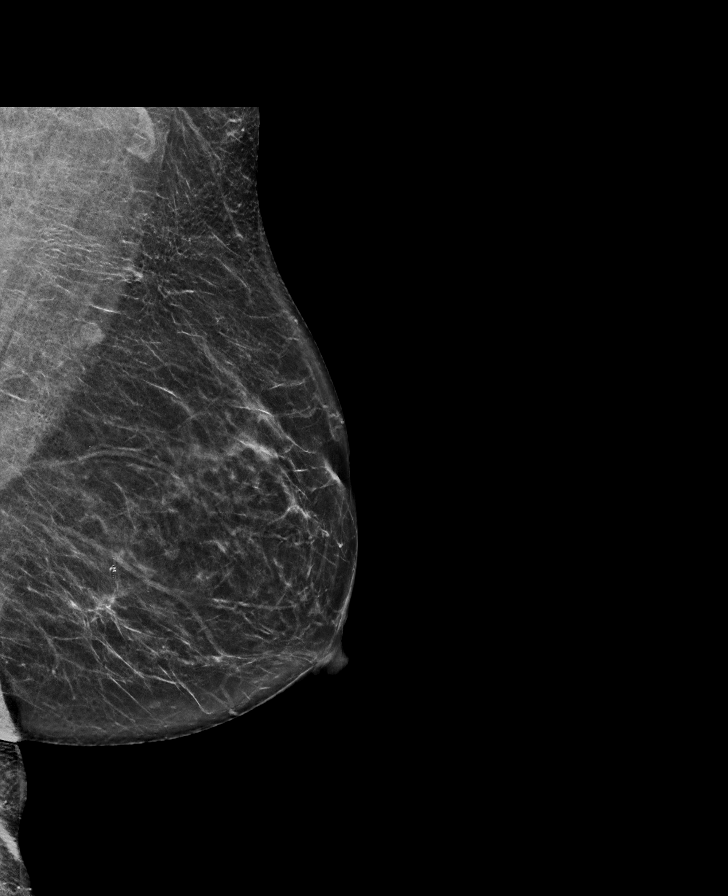

[R CC synth-2D]
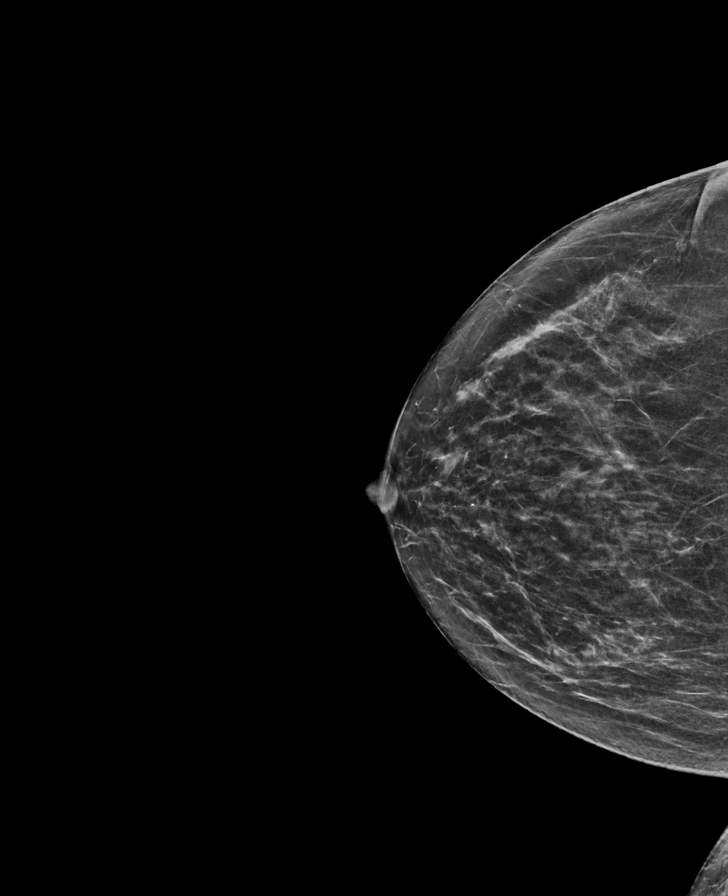

[R MLO synth-2D]
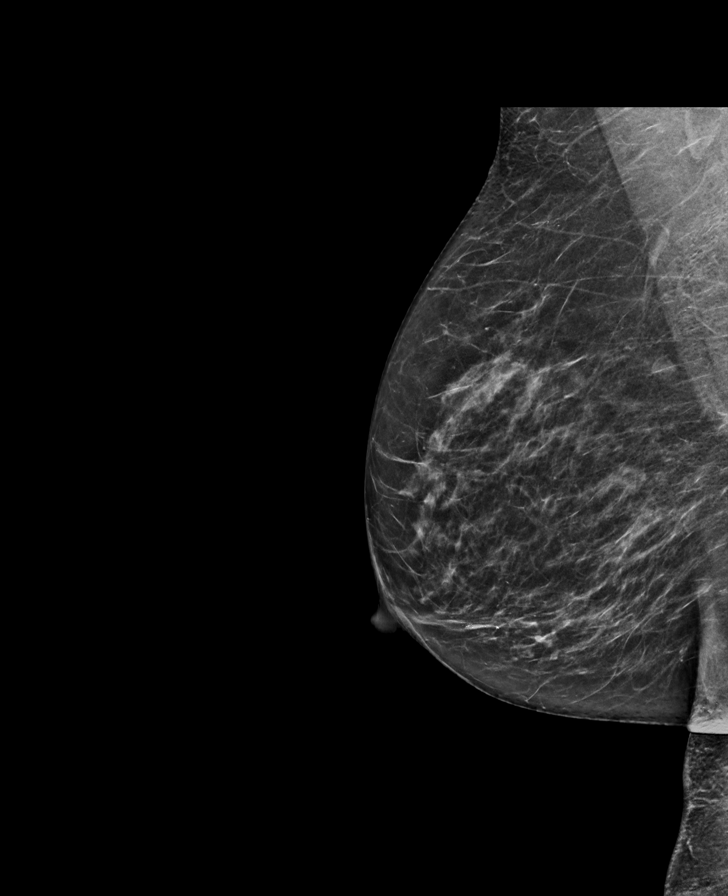

[L CC synth-2D]
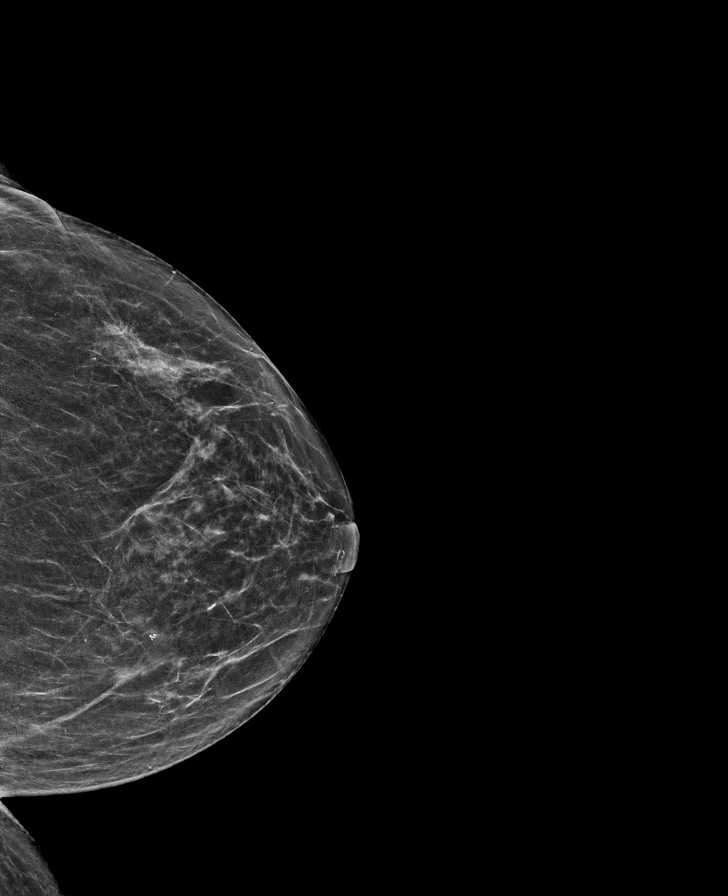

[R CC tomo · tomo slice 29/58.0]
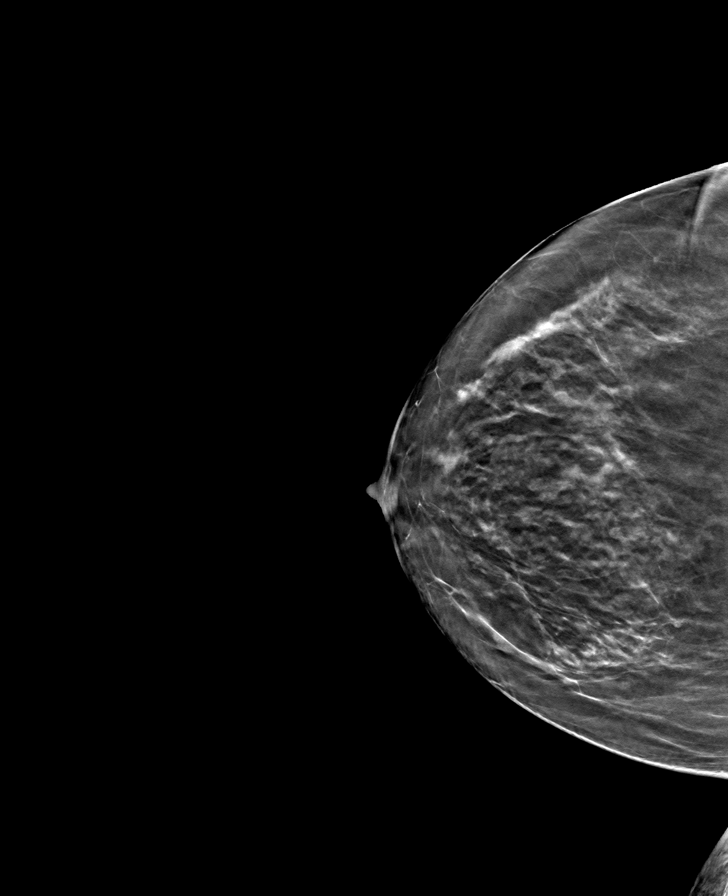

[L MLO tomo · tomo slice 34/67.0]
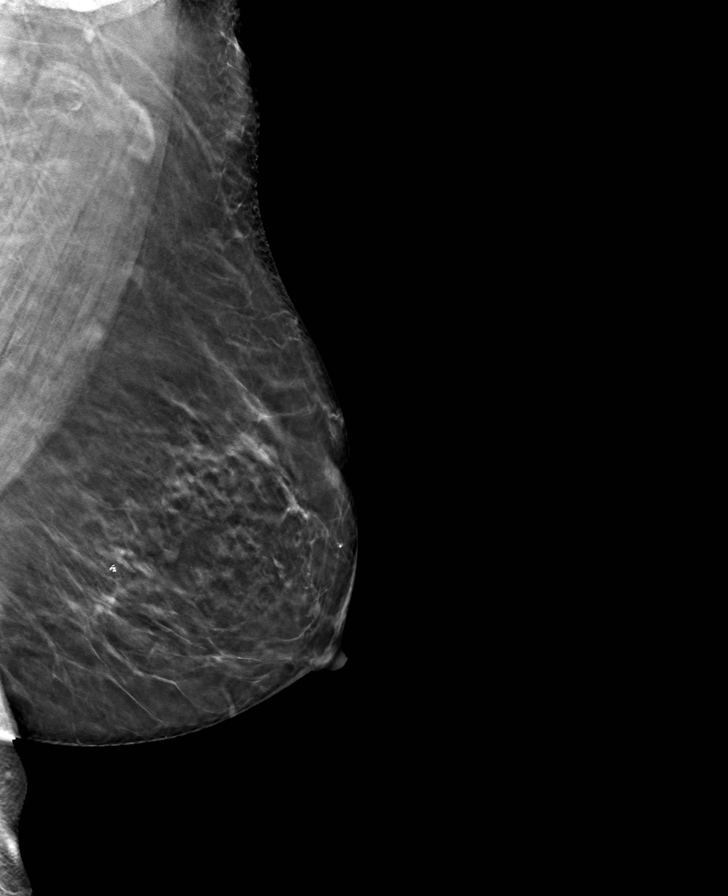

[L CC tomo · tomo slice 31/60.0]
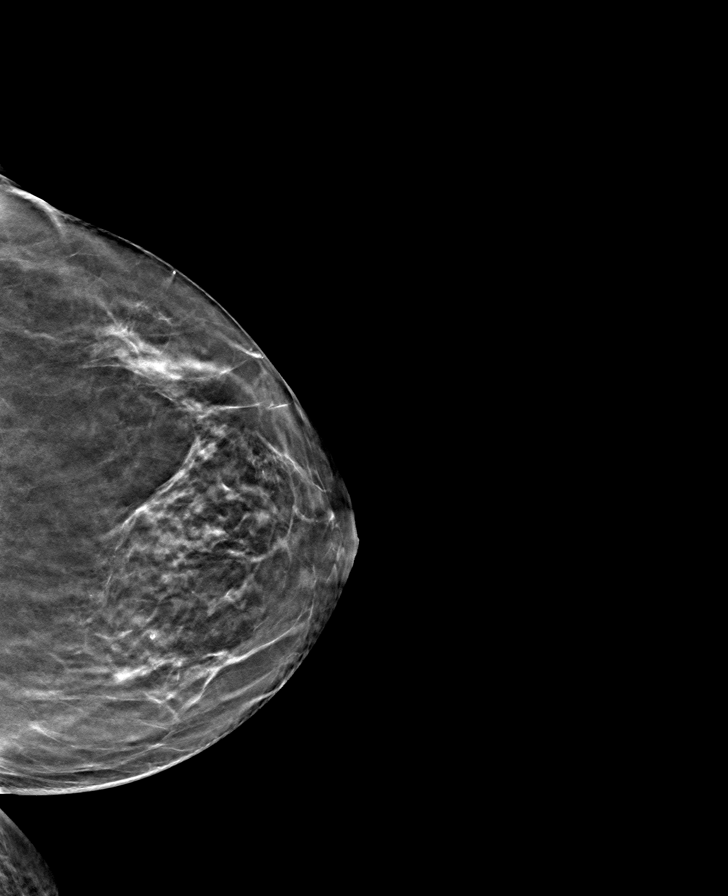

[R MLO tomo · tomo slice 32/63.0]
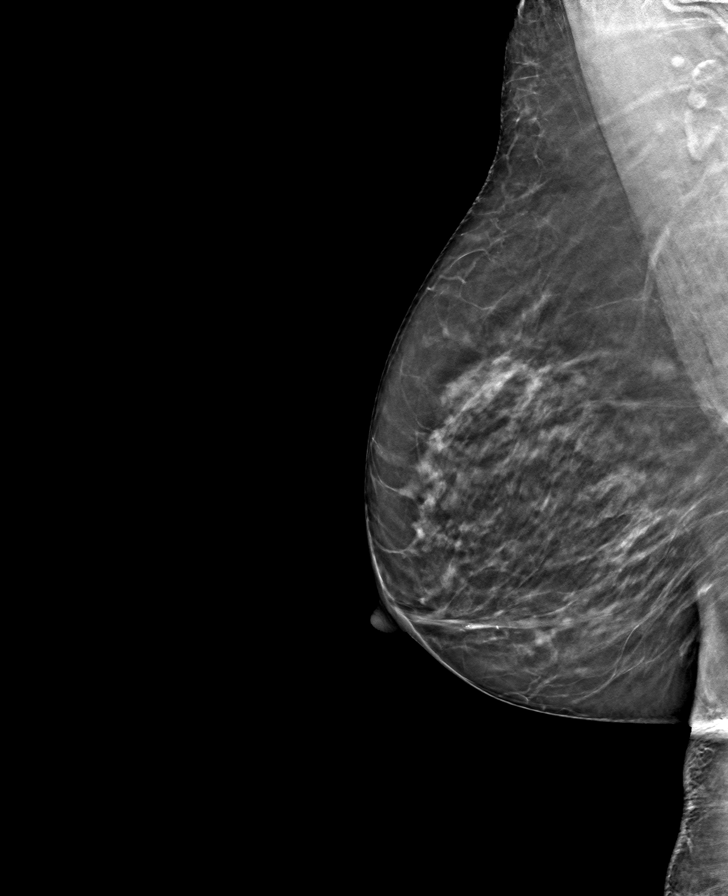

[8 of 24 positions shown; findings below may reference images not displayed]

ACR Breast Density Category b: There are scattered areas of
fibroglandular density.
FINDINGS: There are no findings suspicious for malignancy. Images were
processed with CAD.
IMPRESSION: No mammographic evidence of malignancy. A result letter of this
screening mammogram will be mailed directly to the patient.

RECOMMENDATION:
Screening mammogram in one year. (Code:[TQ])

BI-RADS CATEGORY  1: Negative.

## 2020-02-14 MED ORDER — ALPRAZOLAM 0.25 MG PO TABS
0.2500 mg | ORAL_TABLET | ORAL | 0 refills | Status: DC | PRN
Start: 2020-02-14 — End: 2020-06-29

## 2020-02-14 MED ORDER — APIXABAN 5 MG PO TABS: 5.0000 mg | ORAL_TABLET | Freq: Two times a day (BID) | ORAL | 1 refills | Status: DC

## 2020-02-14 MED ORDER — ASPIRIN EC 81 MG PO TBEC: 81.0000 mg | DELAYED_RELEASE_TABLET | Freq: Every day | ORAL | 3 refills | Status: AC

## 2020-02-14 MED ORDER — METOPROLOL SUCCINATE ER 100 MG PO TB24: 100.0000 mg | ORAL_TABLET | Freq: Every day | ORAL | 1 refills | Status: DC

## 2020-02-14 NOTE — Progress Notes (Signed)
Cardiology Office Note:    Date:  02/14/2020   ID:  Kimberly Gay, DOB May 11, 1941, MRN [id number]  PCP:  Aretta Nip, MD  Cardiologist:  Jenne Campus, MD    Referring MD: Aretta Nip, MD   No chief complaint on file. And very anxious  History of Present Illness:    Kimberly Gay is a 79 y.o. female with past medical history significant for paroxysmal atrial fibrillation, CHA2DS2-VASc score equals 3, the plans were to anticoagulate her but then we discovered that she does have history of bleeding brain aneurysm about 10 years ago.  I did spoke to neurosurgeon after that who gave me permission to start anticoagulation, however then we discovered she get stool guaiac positive.  She did have GI work-up done which was negative and she is here to talk about starting anticoagulation.  Denies have any bleeding there is no problem.  Described to have palpitations sometimes.  There is very stressful situation her sister is in the hospital and she is in very poor shape she is probably dying.  On top of that her best friend recently passed as well she is very anxious about that.  Past Medical History:  Diagnosis Date  . Brain aneurysm 04/03/2019  . Cancer ([place]) 1994   colon-resection  . Cerebral aneurysm, nonruptured 2010   not treated due to location-monitor yearly  . Dyspnea on exertion 04/03/2019  . GERD (gastroesophageal reflux disease)    infrequent  . Hypertension    well controlled  . Incontinence of urine   . Joint pain, knee   . Palpitations 04/03/2019  . Precordial chest pain 04/03/2019  . Urgency of urination     Past Surgical History:  Procedure Laterality Date  . ABDOMINAL HYSTERECTOMY    . ANEURYSM COILING  2010   cerebral coiling-no deficits  . BREAST EXCISIONAL BIOPSY Left 04/05/1994  . CHOLECYSTECTOMY    . CYSTOSCOPY  09/03/2012   Procedure: CYSTOSCOPY;  Surgeon: Ailene Rud, MD;  Location: [facility];  Service: Urology;   Laterality: N/A;  . FLEXIBLE SIGMOIDOSCOPY N/A 11/24/2014   Procedure: FLEXIBLE SIGMOIDOSCOPY;  Surgeon: Garlan Fair, MD;  [facility];  Service: Endoscopy;  Laterality: N/A;  . LAMINECTOMY    . LYSIS OF ADHESION  2000's   with cholecystectomy procedure  . PILONIDAL CYST EXCISION    . PUBOVAGINAL SLING  09/03/2012   Procedure: Gaynelle Arabian;  Surgeon: Ailene Rud, MD;  Location: [facility];  Service: Urology;  Laterality: N/A;  LYNX SLING     Current Medications: Current Meds  Medication Sig  . acetaminophen (TYLENOL) 500 MG tablet Take 500 mg by mouth every 6 (six) hours as needed for mild pain.   Marland Kitchen aspirin EC 81 MG tablet Take 81 mg by mouth daily.  . cetirizine (ZYRTEC) 10 MG tablet Take 10 mg by mouth as needed.   . cholecalciferol (VITAMIN D) 1000 UNITS tablet Take 2,000 Units by mouth daily.   Marland Kitchen EPINEPHrine (EPIPEN) 0.3 mg/0.3 mL SOAJ injection Inject 0.3 mLs (0.3 mg total) into the muscle once.  . furosemide (LASIX) 20 MG tablet Take 20 mg by mouth daily.   . metoprolol succinate (TOPROL-XL) 50 MG 24 hr tablet Take 1.5 tablets (75 mg total) by mouth daily. Take with or immediately following a meal.  . nitroGLYCERIN (NITROSTAT) 0.4 MG SL tablet Place 1 tablet (0.4 mg total) under the tongue every 5 (five) minutes as needed for chest pain.  Allergies:   Amoxicillin, Meperidine and related, and Wasp venom   Social History   Socioeconomic History  . Marital status: Widowed    Spouse name: Not on file  . Number of children: Not on file  . Years of education: Not on file  . Highest education level: Not on file  Occupational History  . Not on file  Tobacco Use  . Smoking status: Never Smoker  . Smokeless tobacco: Never Used  Vaping Use  . Vaping Use: Never used  Substance and Sexual Activity  . Alcohol use: No  . Drug use: No  . Sexual activity: Not on file  Other Topics Concern  . Not on file  Social History Narrative  .  Not on file   Social Determinants of Health   Financial Resource Strain:   . Difficulty of Paying Living Expenses:   Food Insecurity:   . Worried About Charity fundraiser in the Last Year:   . Arboriculturist in the Last Year:   Transportation Needs:   . Film/video editor (Medical):   Marland Kitchen Lack of Transportation (Non-Medical):   Physical Activity:   . Days of Exercise per Week:   . Minutes of Exercise per Session:   Stress:   . Feeling of Stress :   Social Connections:   . Frequency of Communication with Friends and Family:   . Frequency of Social Gatherings with Friends and Family:   . Attends Religious Services:   . Active Member of Clubs or Organizations:   . Attends Archivist Meetings:   Marland Kitchen Marital Status:      Family History: The patient's family history includes Breast cancer in her cousin; Diabetes in an other family member; Heart attack in her father; Seizures in her mother. ROS:   Please see the history of present illness.    All 14 point review of systems negative except as described per history of present illness  EKGs/Labs/Other Studies Reviewed:      Recent Labs: 10/28/2019: BUN 18; Creatinine, Ser 1.01; Hemoglobin 15.4; Platelets 231; Potassium 3.7; Sodium 140  Recent Lipid Panel No results found for: CHOL, TRIG, HDL, CHOLHDL, VLDL, LDLCALC, LDLDIRECT  Physical Exam:    VS:  BP 132/86 (BP Location: Right Arm, Patient Position: Sitting, Cuff Size: Normal)   Pulse 71   Ht 5\' 5"  (1.651 m)   Wt 172 lb 12.8 oz (78.4 kg)   SpO2 94%   BMI 28.76 kg/m     Wt Readings from Last 3 Encounters:  02/14/20 172 lb 12.8 oz (78.4 kg)  11/29/19 176 lb 0.6 oz (79.9 kg)  10/28/19 179 lb (81.2 kg)     GEN:  Well nourished, well developed in no acute distress HEENT: Normal NECK: No JVD; No carotid bruits LYMPHATICS: No lymphadenopathy CARDIAC: RRR, no murmurs, no rubs, no gallops RESPIRATORY:  Clear to auscultation without rales, wheezing or rhonchi   ABDOMEN: Soft, non-tender, non-distended MUSCULOSKELETAL:  No edema; No deformity  SKIN: Warm and dry LOWER EXTREMITIES: no swelling NEUROLOGIC:  Alert and oriented x 3 PSYCHIATRIC:  Normal affect   ASSESSMENT:    1. Paroxysmal atrial fibrillation (HCC) CHA2DS2-VASc equals 4, not anticoagulated so far because of history of intracranial bleed and aneurysm   2. Palpitations   3. Dyspnea on exertion    PLAN:    In order of problems listed above:  1. Paroxysmal atrial fibrillation, I did talk to neurosurgeons regarding anticoagulation there is no issue from his point  review.  Therefore, I will stop aspirin for about 3 days then I will start Eliquis 5 mg twice daily. 2. Palpitations: I will increase dose of Toprol-XL 200 mg daily. 3. Dyspnea on exertion denies having any. 4. Anxiety and stress.  I will give her a small dose of Xanax only 5 tablets of 0.25 to take on as-needed basis. I returned to the room and talk to her in length about this entire scenario and I told her that I feel very uncomfortable about anticoagulating her.  In spite of the fact that neurosurgeon tells me it should be no problem with her residual plan otherwise it really make it very uncomfortable.  We talked about potential alternatives to this which is watchman device.  And we elected to pursue that issue.  Therefore, she will be [facility] to [facility] for evaluation for possible watchman device.  I explained to her what is involved facial her brochure she is willing to proceed.    Medication Adjustments/Labs and Tests Ordered: Current medicines are reviewed at length with the patient today.  Concerns regarding medicines are outlined above.  No orders of the defined types were placed in this encounter.  Medication changes: No orders of the defined types were placed in this encounter.   Signed, Park Liter, MD, [facility] 02/14/2020 9:26 AM    [place]

## 2020-02-14 NOTE — Patient Instructions (Addendum)
Medication Instructions:  Your physician has recommended you make the following change in your medication:  INCREASE: Metoprolol succinate 100 mg daily   Take as needed for anxiety: Xanax 0.25 mg    *If you need a refill on your cardiac medications before your next appointment, please call your pharmacy*   Lab Work: None. If you have labs (blood work) drawn today and your tests are completely normal, you will receive your results only by: Marland Kitchen MyChart Message (if you have MyChart) OR . A paper copy in the mail If you have any lab test that is abnormal or we need to change your treatment, we will call you to review the results.   Testing/Procedures: None.    Follow-Up: At [facility], you and your health needs are our priority.  As part of our continuing mission to provide you with exceptional heart care, we have created designated Provider Care Teams.  These Care Teams include your primary Cardiologist (physician) and Advanced Practice Providers (APPs -  Physician Assistants and Nurse Practitioners) who all work together to provide you with the care you need, when you need it.  We recommend signing up for the patient portal called "MyChart".  Sign up information is provided on this After Visit Summary.  MyChart is used to connect with patients for Virtual Visits (Telemedicine).  Patients are able to view lab/test results, encounter notes, upcoming appointments, etc.  Non-urgent messages can be sent to your provider as well.   To learn more about what you can do with MyChart, go to [email or web address].    Your next appointment:   4 month(s)  The format for your next appointment:   In Person  Provider:   Jenne Campus, MD   Other Instructions

## 2020-02-20 DIAGNOSIS — R195 Other fecal abnormalities: Secondary | ICD-10-CM | POA: Diagnosis not present

## 2020-02-20 DIAGNOSIS — I480 Paroxysmal atrial fibrillation: Secondary | ICD-10-CM | POA: Diagnosis not present

## 2020-02-20 DIAGNOSIS — Z95810 Presence of automatic (implantable) cardiac defibrillator: Secondary | ICD-10-CM | POA: Diagnosis not present

## 2020-02-20 DIAGNOSIS — I671 Cerebral aneurysm, nonruptured: Secondary | ICD-10-CM | POA: Diagnosis not present

## 2020-02-26 DIAGNOSIS — R195 Other fecal abnormalities: Secondary | ICD-10-CM | POA: Insufficient documentation

## 2020-02-26 DIAGNOSIS — K219 Gastro-esophageal reflux disease without esophagitis: Secondary | ICD-10-CM | POA: Insufficient documentation

## 2020-02-26 HISTORY — DX: Other fecal abnormalities: R19.5

## 2020-03-05 DIAGNOSIS — K769 Liver disease, unspecified: Secondary | ICD-10-CM | POA: Diagnosis not present

## 2020-03-05 DIAGNOSIS — I482 Chronic atrial fibrillation, unspecified: Secondary | ICD-10-CM | POA: Diagnosis not present

## 2020-03-25 DIAGNOSIS — Z95818 Presence of other cardiac implants and grafts: Secondary | ICD-10-CM | POA: Diagnosis not present

## 2020-03-25 DIAGNOSIS — I10 Essential (primary) hypertension: Secondary | ICD-10-CM | POA: Diagnosis not present

## 2020-03-25 DIAGNOSIS — I4891 Unspecified atrial fibrillation: Secondary | ICD-10-CM | POA: Diagnosis not present

## 2020-03-25 DIAGNOSIS — Z006 Encounter for examination for normal comparison and control in clinical research program: Secondary | ICD-10-CM | POA: Diagnosis not present

## 2020-03-25 DIAGNOSIS — Z7982 Long term (current) use of aspirin: Secondary | ICD-10-CM | POA: Diagnosis not present

## 2020-03-25 DIAGNOSIS — I480 Paroxysmal atrial fibrillation: Secondary | ICD-10-CM | POA: Diagnosis not present

## 2020-04-06 DIAGNOSIS — Z0000 Encounter for general adult medical examination without abnormal findings: Secondary | ICD-10-CM | POA: Diagnosis not present

## 2020-04-06 DIAGNOSIS — I4891 Unspecified atrial fibrillation: Secondary | ICD-10-CM | POA: Diagnosis not present

## 2020-04-06 DIAGNOSIS — I10 Essential (primary) hypertension: Secondary | ICD-10-CM | POA: Diagnosis not present

## 2020-05-14 DIAGNOSIS — I671 Cerebral aneurysm, nonruptured: Secondary | ICD-10-CM | POA: Diagnosis not present

## 2020-05-14 DIAGNOSIS — Z9049 Acquired absence of other specified parts of digestive tract: Secondary | ICD-10-CM | POA: Diagnosis not present

## 2020-05-14 DIAGNOSIS — I480 Paroxysmal atrial fibrillation: Secondary | ICD-10-CM | POA: Diagnosis not present

## 2020-05-14 DIAGNOSIS — Z7982 Long term (current) use of aspirin: Secondary | ICD-10-CM | POA: Diagnosis not present

## 2020-05-14 DIAGNOSIS — Z885 Allergy status to narcotic agent status: Secondary | ICD-10-CM | POA: Diagnosis not present

## 2020-05-14 DIAGNOSIS — I10 Essential (primary) hypertension: Secondary | ICD-10-CM | POA: Diagnosis not present

## 2020-05-14 DIAGNOSIS — K838 Other specified diseases of biliary tract: Secondary | ICD-10-CM | POA: Diagnosis not present

## 2020-06-26 ENCOUNTER — Other Ambulatory Visit: Payer: Self-pay

## 2020-06-26 DIAGNOSIS — M25569 Pain in unspecified knee: Secondary | ICD-10-CM | POA: Insufficient documentation

## 2020-06-26 DIAGNOSIS — R3915 Urgency of urination: Secondary | ICD-10-CM | POA: Insufficient documentation

## 2020-06-26 DIAGNOSIS — I10 Essential (primary) hypertension: Secondary | ICD-10-CM | POA: Insufficient documentation

## 2020-06-26 DIAGNOSIS — K219 Gastro-esophageal reflux disease without esophagitis: Secondary | ICD-10-CM | POA: Insufficient documentation

## 2020-06-26 DIAGNOSIS — K2970 Gastritis, unspecified, without bleeding: Secondary | ICD-10-CM | POA: Insufficient documentation

## 2020-06-26 DIAGNOSIS — R32 Unspecified urinary incontinence: Secondary | ICD-10-CM | POA: Insufficient documentation

## 2020-06-29 ENCOUNTER — Encounter: Payer: Self-pay | Admitting: Cardiology

## 2020-06-29 ENCOUNTER — Ambulatory Visit: Payer: Medicare PPO | Admitting: Cardiology

## 2020-06-29 ENCOUNTER — Other Ambulatory Visit: Payer: Self-pay

## 2020-06-29 VITALS — BP 140/72 | HR 64 | Ht 65.0 in | Wt 178.0 lb

## 2020-06-29 DIAGNOSIS — I480 Paroxysmal atrial fibrillation: Secondary | ICD-10-CM

## 2020-06-29 DIAGNOSIS — Z95818 Presence of other cardiac implants and grafts: Secondary | ICD-10-CM

## 2020-06-29 DIAGNOSIS — R0609 Other forms of dyspnea: Secondary | ICD-10-CM

## 2020-06-29 DIAGNOSIS — R0600 Dyspnea, unspecified: Secondary | ICD-10-CM | POA: Diagnosis not present

## 2020-06-29 DIAGNOSIS — I671 Cerebral aneurysm, nonruptured: Secondary | ICD-10-CM | POA: Diagnosis not present

## 2020-06-29 NOTE — Addendum Note (Signed)
Addended by: Senaida Ores on: 06/29/2020 09:54 AM   Modules accepted: Orders

## 2020-06-29 NOTE — Patient Instructions (Signed)
Medication Instructions:  Your physician recommends that you continue on your current medications as directed. Please refer to the Current Medication list given to you today.  *If you need a refill on your cardiac medications before your next appointment, please call your pharmacy*   Lab Work: Your physician recommends that you return for lab work today: stool sample and cbc   If you have labs (blood work) drawn today and your tests are completely normal, you will receive your results only by: Marland Kitchen MyChart Message (if you have MyChart) OR . A paper copy in the mail If you have any lab test that is abnormal or we need to change your treatment, we will call you to review the results.   Testing/Procedures: none   Follow-Up: At [facility], you and your health needs are our priority.  As part of our continuing mission to provide you with exceptional heart care, we have created designated Provider Care Teams.  These Care Teams include your primary Cardiologist (physician) and Advanced Practice Providers (APPs -  Physician Assistants and Nurse Practitioners) who all work together to provide you with the care you need, when you need it.  We recommend signing up for the patient portal called "MyChart".  Sign up information is provided on this After Visit Summary.  MyChart is used to connect with patients for Virtual Visits (Telemedicine).  Patients are able to view lab/test results, encounter notes, upcoming appointments, etc.  Non-urgent messages can be sent to your provider as well.   To learn more about what you can do with MyChart, go to [email or web address].    Your next appointment:   6 month(s)  The format for your next appointment:   In Person  Provider:   Jenne Campus, MD   Other Instructions

## 2020-06-29 NOTE — Progress Notes (Signed)
Cardiology Office Note:    Date:  06/29/2020   ID:  Kimberly Gay, DOB Sep 14, 1940, MRN [id number]  PCP:  Aretta Nip, MD  Cardiologist:  Jenne Campus, MD    Referring MD: Aretta Nip, MD   Chief Complaint  Patient presents with  . Follow-up  Doing well but I have a pain in my back  History of Present Illness:    Kimberly Gay is a 79 y.o. female with past medical history significant for paroxysmal atrial fibrillation, there was an issue of anticoagulation.  She does have history of brain aneurysm and we were reluctant to anticoagulate, her chads 2 vascular equals 4.  In August she was well sent to [facility] for watchman device implantation, she went to surgery with no difficulties and she is off anticoagulation right now.  Recently also she did have a CT done to look at her aorta, ectatic no significantly enlargement, but she was find to have cyst in the liver and she is very worried about it.  We will refer her back to her primary care physician most likely she required GI specialist.  On top of that she did notice some black stool however she started worrying about this and started taking Pepto-Bismol, obviously stool is black right now because of Pepto-Bismol but she is worried about potentially bleeding.  Denies have any chest pain tightness squeezing pressure burning chest does complain of having back pain.  Worse with movement.  Past Medical History:  Diagnosis Date  . Brain aneurysm 04/03/2019  . Cancer ([place]) 1994   colon-resection  . Cerebral aneurysm 04/03/2019  . Cerebral aneurysm, nonruptured 2010   not treated due to location-monitor yearly  . Dyspnea on exertion 04/03/2019  . GERD (gastroesophageal reflux disease)    infrequent  . Guaiac + stool 02/26/2020  . Hypertension    well controlled  . Incontinence of urine   . Joint pain, knee   . Palpitations 04/03/2019  . Paroxysmal atrial fibrillation (HCC) CHA2DS2-VASc equals 4, not anticoagulated so far  because of history of intracranial bleed and aneurysm 10/28/2019  . Precordial chest pain 04/03/2019  . Urgency of urination     Past Surgical History:  Procedure Laterality Date  . ABDOMINAL HYSTERECTOMY    . ANEURYSM COILING  2010   cerebral coiling-no deficits  . BREAST EXCISIONAL BIOPSY Left 04/05/1994  . CHOLECYSTECTOMY    . CYSTOSCOPY  09/03/2012   Procedure: CYSTOSCOPY;  Surgeon: Ailene Rud, MD;  [facility];  Service: Urology;  Laterality: N/A;  . FLEXIBLE SIGMOIDOSCOPY N/A 11/24/2014   Procedure: FLEXIBLE SIGMOIDOSCOPY;  Surgeon: Garlan Fair, MD;  Location: [facility];  Service: Endoscopy;  Laterality: N/A;  . LAMINECTOMY    . LYSIS OF ADHESION  2000's   with cholecystectomy procedure  . PILONIDAL CYST EXCISION    . PUBOVAGINAL SLING  09/03/2012   Procedure: Gaynelle Arabian;  Surgeon: Ailene Rud, MD;  Location: [facility];  Service: Urology;  Laterality: N/A;  LYNX SLING     Current Medications: Current Meds  Medication Sig  . acetaminophen (TYLENOL) 500 MG tablet Take 500 mg by mouth every 6 (six) hours as needed for mild pain.   Marland Kitchen aspirin EC 81 MG tablet Take 1 tablet (81 mg total) by mouth daily. Swallow whole.  . cetirizine (ZYRTEC) 10 MG tablet Take 10 mg by mouth as needed.   . cholecalciferol (VITAMIN D) 1000 UNITS tablet Take 2,000 Units by  mouth daily.   . clindamycin (CLEOCIN) 300 MG capsule Take 600 mg by mouth daily. Take 2 capsules before dentist appointment  . clopidogrel (PLAVIX) 75 MG tablet Take 75 mg by mouth daily.  . clotrimazole-betamethasone (LOTRISONE) cream Apply 1 application topically daily.  Marland Kitchen EPINEPHrine (EPIPEN) 0.3 mg/0.3 mL SOAJ injection Inject 0.3 mLs (0.3 mg total) into the muscle once.  . furosemide (LASIX) 20 MG tablet Take 20 mg by mouth daily.   . hydrocortisone 2.5 % cream Apply 2.5 application topically daily.  . metoprolol succinate (TOPROL-XL) 100 MG 24 hr tablet  Take 1 tablet (100 mg total) by mouth daily. Take with or immediately following a meal.  . nitroGLYCERIN (NITROSTAT) 0.4 MG SL tablet Place 1 tablet (0.4 mg total) under the tongue every 5 (five) minutes as needed for chest pain.  . phenylephrine-shark liver oil-mineral oil-petrolatum (PREPARATION H) 0.25-3-14-71.9 % rectal ointment Place 1 application rectally daily.  Vladimir Faster Glycol-Propyl Glycol 0.4-0.3 % SOLN Place 1 drop into both eyes in the morning and at bedtime.     Allergies:   Amoxicillin, Meperidine and related, and Wasp venom   Social History   Socioeconomic History  . Marital status: Widowed    Spouse name: Not on file  . Number of children: Not on file  . Years of education: Not on file  . Highest education level: Not on file  Occupational History  . Not on file  Tobacco Use  . Smoking status: Never Smoker  . Smokeless tobacco: Never Used  Vaping Use  . Vaping Use: Never used  Substance and Sexual Activity  . Alcohol use: No  . Drug use: No  . Sexual activity: Not on file  Other Topics Concern  . Not on file  Social History Narrative  . Not on file   Social Determinants of Health   Financial Resource Strain:   . Difficulty of Paying Living Expenses: Not on file  Food Insecurity:   . Worried About Charity fundraiser in the Last Year: Not on file  . Ran Out of Food in the Last Year: Not on file  Transportation Needs:   . Lack of Transportation (Medical): Not on file  . Lack of Transportation (Non-Medical): Not on file  Physical Activity:   . Days of Exercise per Week: Not on file  . Minutes of Exercise per Session: Not on file  Stress:   . Feeling of Stress : Not on file  Social Connections:   . Frequency of Communication with Friends and Family: Not on file  . Frequency of Social Gatherings with Friends and Family: Not on file  . Attends Religious Services: Not on file  . Active Member of Clubs or Organizations: Not on file  . Attends Theatre manager Meetings: Not on file  . Marital Status: Not on file     Family History: The patient's family history includes Breast cancer in her cousin; Diabetes in an other family member; Heart attack in her father; Seizures in her mother. ROS:   Please see the history of present illness.    All 14 point review of systems negative except as described per history of present illness  EKGs/Labs/Other Studies Reviewed:      Recent Labs: 10/28/2019: BUN 18; Creatinine, Ser 1.01; Hemoglobin 15.4; Platelets 231; Potassium 3.7; Sodium 140  Recent Lipid Panel No results found for: CHOL, TRIG, HDL, CHOLHDL, VLDL, LDLCALC, LDLDIRECT  Physical Exam:    VS:  BP 140/72 (BP Location: Right  Arm, Patient Position: Sitting, Cuff Size: Normal)   Pulse 64   Ht 5\' 5"  (1.651 m)   Wt 178 lb (80.7 kg)   SpO2 97%   BMI 29.62 kg/m     Wt Readings from Last 3 Encounters:  06/29/20 178 lb (80.7 kg)  02/14/20 172 lb 12.8 oz (78.4 kg)  11/29/19 176 lb 0.6 oz (79.9 kg)     GEN:  Well nourished, well developed in no acute distress HEENT: Normal NECK: No JVD; No carotid bruits LYMPHATICS: No lymphadenopathy CARDIAC: RRR, no murmurs, no rubs, no gallops RESPIRATORY:  Clear to auscultation without rales, wheezing or rhonchi  ABDOMEN: Soft, non-tender, non-distended MUSCULOSKELETAL:  No edema; No deformity  SKIN: Warm and dry LOWER EXTREMITIES: no swelling NEUROLOGIC:  Alert and oriented x 3 PSYCHIATRIC:  Normal affect   ASSESSMENT:    1. Paroxysmal atrial fibrillation (HCC) CHA2DS2-VASc equals 4, not anticoagulated so far because of history of intracranial bleed and aneurysm   2. Presence of Watchman left atrial appendage closure device   3. Cerebral aneurysm, nonruptured   4. Dyspnea on exertion    PLAN:    In order of problems listed above:  1. Paroxysmal atrial fibrillation CHA2DS2-VASc equals 4 status post watchman device.  She is on Plavix aspirin, reported black stool which is most  likely Pepto-Bismol related, I will ask her to have stool guaiac checked as well as we will check her CBC today. 2. History of cerebral aneurysm follow-up by neurosurgery. 3. Dyspnea on exertion: Denies having any. 4. 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  We will follow-up. 5. Dyslipidemia I did review her K PN from April 06, 2020 with LDL of 97 HDL of 52.  We will continue present management. 6. I did review notes from [facility] for this visit   Medication Adjustments/Labs and Tests Ordered: Current medicines are reviewed at length with the patient today.  Concerns regarding medicines are outlined above.  No orders of the defined types were placed in this encounter.  Medication changes: No orders of the defined types were placed in this encounter.   Signed, Park Liter, MD, [facility] 06/29/2020 9:49 AM    [place]

## 2020-06-30 DIAGNOSIS — R0600 Dyspnea, unspecified: Secondary | ICD-10-CM | POA: Diagnosis not present

## 2020-06-30 DIAGNOSIS — I671 Cerebral aneurysm, nonruptured: Secondary | ICD-10-CM | POA: Diagnosis not present

## 2020-06-30 DIAGNOSIS — Z95818 Presence of other cardiac implants and grafts: Secondary | ICD-10-CM | POA: Diagnosis not present

## 2020-06-30 DIAGNOSIS — I480 Paroxysmal atrial fibrillation: Secondary | ICD-10-CM | POA: Diagnosis not present

## 2020-06-30 LAB — CBC
Hematocrit: 44.5 % (ref 34.0–46.6)
Hemoglobin: 14.9 g/dL (ref 11.1–15.9)
MCH: 28.8 pg (ref 26.6–33.0)
MCHC: 33.5 g/dL (ref 31.5–35.7)
MCV: 86 fL (ref 79–97)
Platelets: 243 10*3/uL (ref 150–450)
RBC: 5.17 x10E6/uL (ref 3.77–5.28)
RDW: 13.1 % (ref 11.7–15.4)
WBC: 8.2 10*3/uL (ref 3.4–10.8)

## 2020-07-02 LAB — FECAL OCCULT BLOOD, IMMUNOCHEMICAL: Fecal Occult Bld: POSITIVE — AB

## 2020-07-14 ENCOUNTER — Other Ambulatory Visit: Payer: Self-pay | Admitting: Physician Assistant

## 2020-07-14 DIAGNOSIS — R1011 Right upper quadrant pain: Secondary | ICD-10-CM

## 2020-07-14 DIAGNOSIS — K921 Melena: Secondary | ICD-10-CM | POA: Diagnosis not present

## 2020-07-14 DIAGNOSIS — Z85038 Personal history of other malignant neoplasm of large intestine: Secondary | ICD-10-CM | POA: Diagnosis not present

## 2020-07-24 DIAGNOSIS — B349 Viral infection, unspecified: Secondary | ICD-10-CM | POA: Diagnosis not present

## 2020-07-24 DIAGNOSIS — Z20828 Contact with and (suspected) exposure to other viral communicable diseases: Secondary | ICD-10-CM | POA: Diagnosis not present

## 2020-07-28 DIAGNOSIS — R059 Cough, unspecified: Secondary | ICD-10-CM | POA: Diagnosis not present

## 2020-07-30 ENCOUNTER — Other Ambulatory Visit: Payer: Medicare PPO

## 2020-08-10 ENCOUNTER — Other Ambulatory Visit: Payer: Self-pay | Admitting: Cardiology

## 2020-08-10 NOTE — Telephone Encounter (Signed)
Refill sent to pharmacy.   Metoprolol 100 mg

## 2020-08-17 ENCOUNTER — Ambulatory Visit
Admission: RE | Admit: 2020-08-17 | Discharge: 2020-08-17 | Disposition: A | Discharge status: Home / Self Care | Payer: Medicare PPO | Source: Ambulatory Visit | Attending: Physician Assistant | Admitting: Physician Assistant

## 2020-08-17 ENCOUNTER — Other Ambulatory Visit: Payer: Self-pay

## 2020-08-17 DIAGNOSIS — R935 Abnormal findings on diagnostic imaging of other abdominal regions, including retroperitoneum: Secondary | ICD-10-CM | POA: Diagnosis not present

## 2020-08-17 DIAGNOSIS — R1011 Right upper quadrant pain: Secondary | ICD-10-CM

## 2020-08-17 DIAGNOSIS — K838 Other specified diseases of biliary tract: Secondary | ICD-10-CM | POA: Diagnosis not present

## 2020-08-17 DIAGNOSIS — N281 Cyst of kidney, acquired: Secondary | ICD-10-CM | POA: Diagnosis not present

## 2020-08-17 DIAGNOSIS — N2889 Other specified disorders of kidney and ureter: Secondary | ICD-10-CM | POA: Diagnosis not present

## 2020-08-17 IMAGING — MR MR ABDOMEN WO/W CM MRCP
12 of 20 series · 25 of 48 positions shown · IV contrast (multihance)
Comparison: CT abdomen/pelvis dated [DATE]

CLINICAL DATA: Right upper quadrant abdominal pain

EXAM:
MRI ABDOMEN WITHOUT AND WITH CONTRAST (INCLUDING MRCP)
TECHNIQUE: Multiplanar multisequence MR imaging of the abdomen was performed
both before and after the administration of intravenous contrast.
Heavily T2-weighted images of the biliary and pancreatic ducts were
obtained, and three-dimensional MRCP images were rendered by post
processing.
CONTRAST:  13mL MULTIHANCE GADOBENATE DIMEGLUMINE 529 MG/ML IV SOLN

[Series 4: cor haste · coronal · 5.0mm · 0.74mm/px · 2 of 32 slices shown]
[im 1/32]
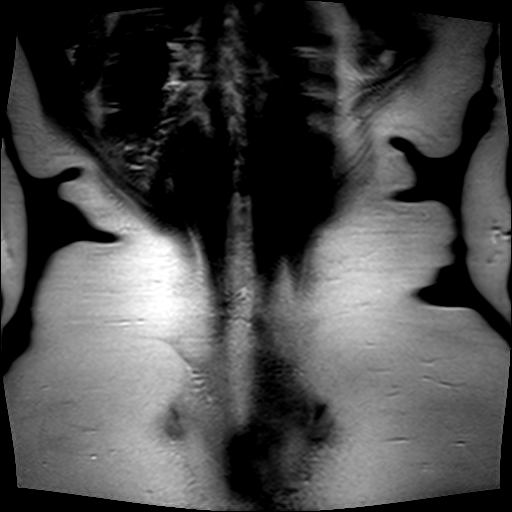
[im 32/32]
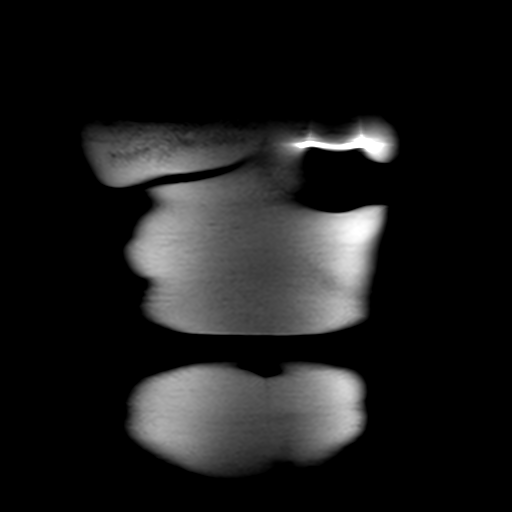

[Series 5: bSSFP · coronal · 5.0mm · 0.78mm/px · 2 of 27 slices shown (1 of 2)]
[im 1/27]
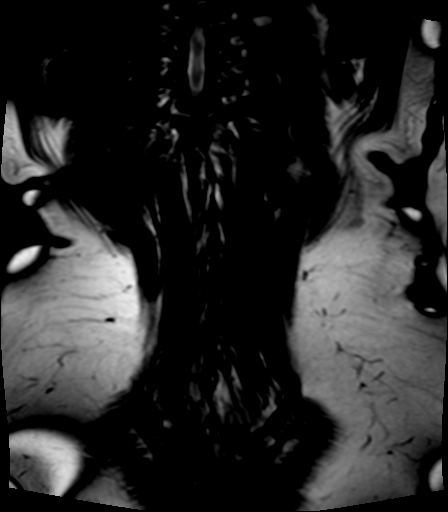
[im 27/27]
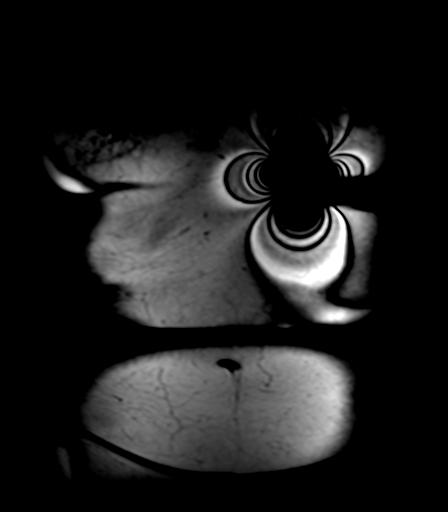

[Series 6: T2 · coronal · 3.0mm · 0.70mm/px · 2 of 48 slices shown (1 of 2)]
[im 1/48]
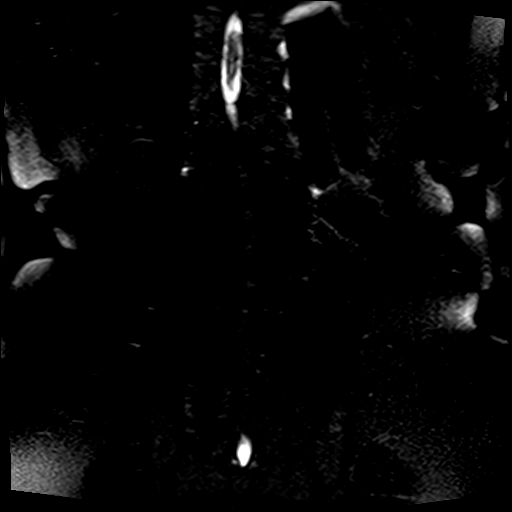
[im 48/48]
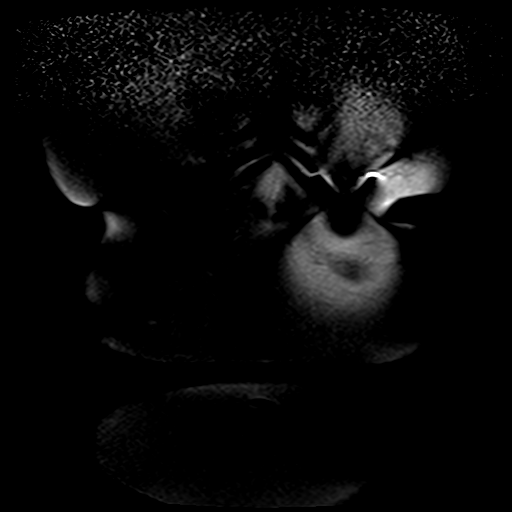

[Series 9: T1 · axial · 6.0mm · 0.74mm/px · z∈[-87,+105]mm · 2 of 60 slices shown]
[im 1/60]
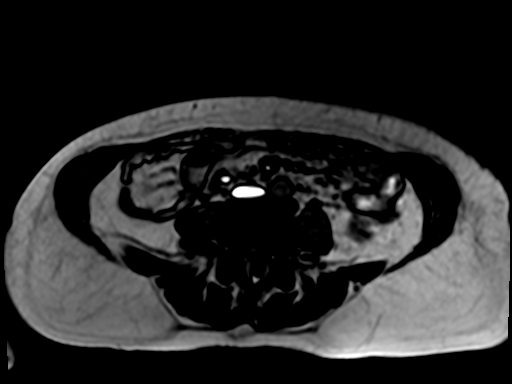
[im 60/60]
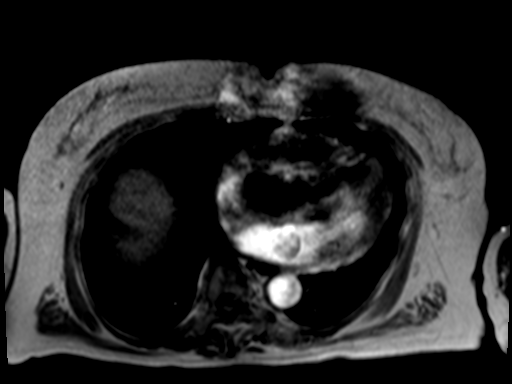

[Series 10: axial haste · axial · 6.0mm · 0.74mm/px · 1 of 30 slices shown]
[im 1/30]
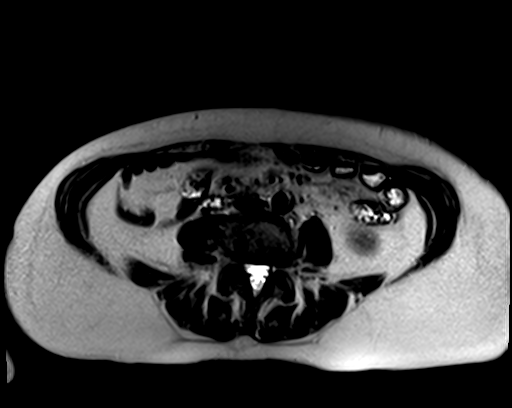

[Series 11: T2 · axial · 6.0mm · 1.12mm/px · 1 of 30 slices shown (2 of 2)]
[im 1/30]
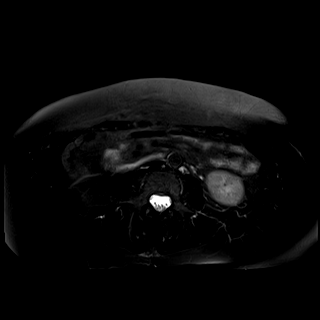

[Series 14: ep2d_[id number]_500_[id number]_trig · axial · 6.0mm · 1.98mm/px · z∈[-67,+142]mm · 3 of 90 slices shown]
[im 1/90]
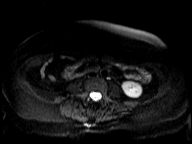
[im 45/90]
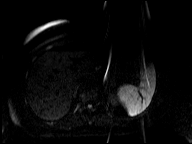
[im 90/90]
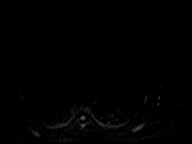

[Series 15: [id number]_800_p2_trig_adc · axial · 6.0mm · 1.98mm/px · 1 of 30 slices shown]
[im 1/30]
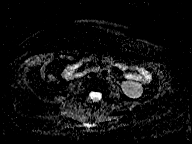

[Series 16: bSSFP · axial · 4.0mm · 0.74mm/px · z∈[-49,+115]mm · 2 of 42 slices shown (2 of 2)]
[im 1/42]
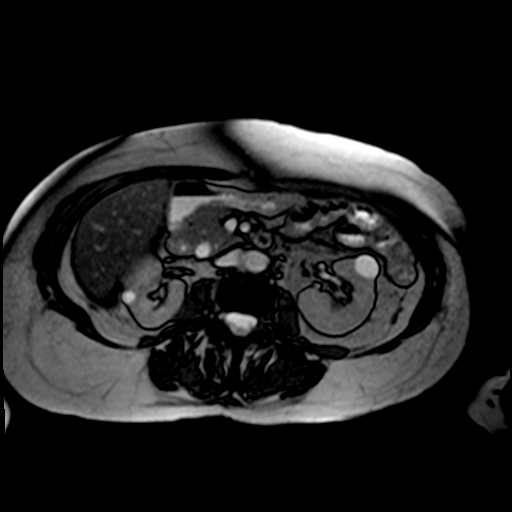
[im 42/42]
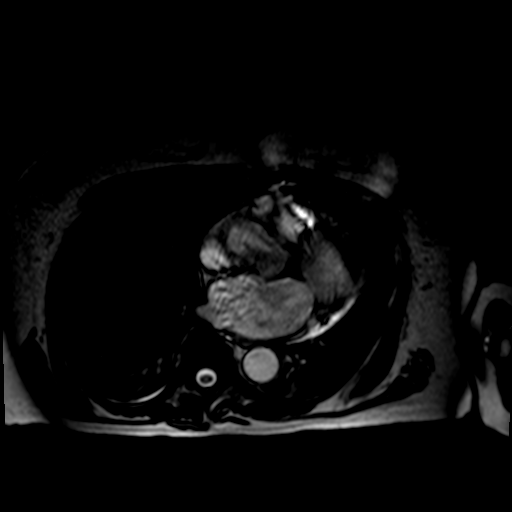

[Series 17: T1 dynamic · axial · non-contrast · 2.5mm · 0.74mm/px · z∈[-66,+132]mm · 3 of 80 slices shown]
[im 1/80]
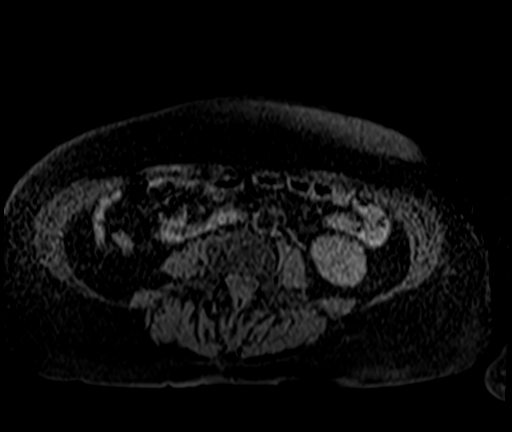
[im 40/80]
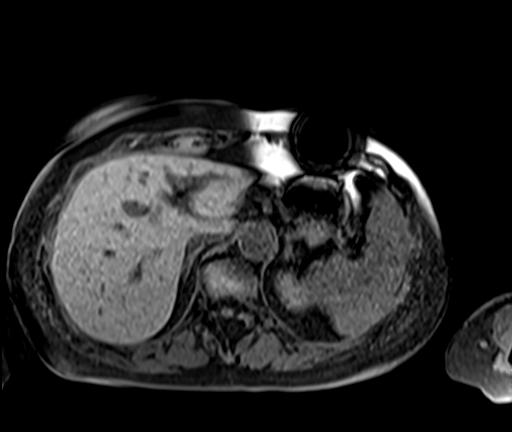
[im 80/80]
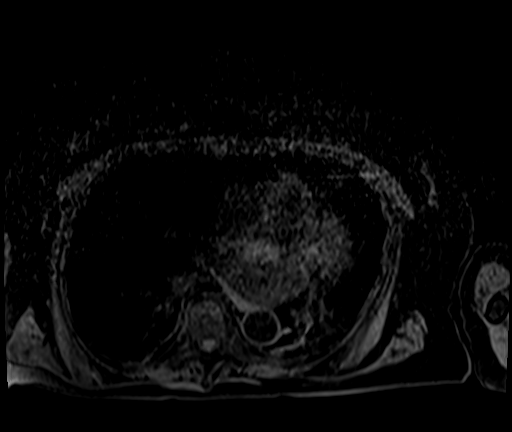

[Series 18: T1 dynamic post-contrast · axial · 2.5mm · 0.74mm/px · z∈[-66,+132]mm · 3 of 80 slices shown (1 of 2)]
[im 1/80]
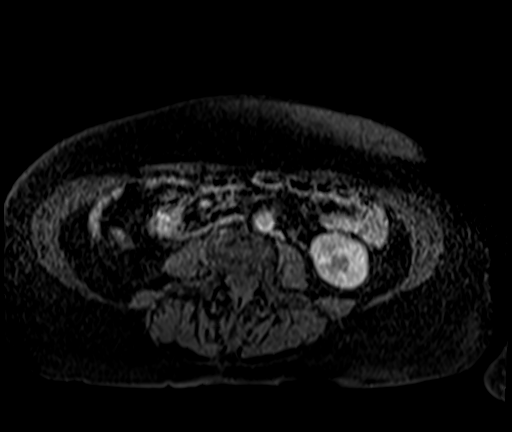
[im 40/80]
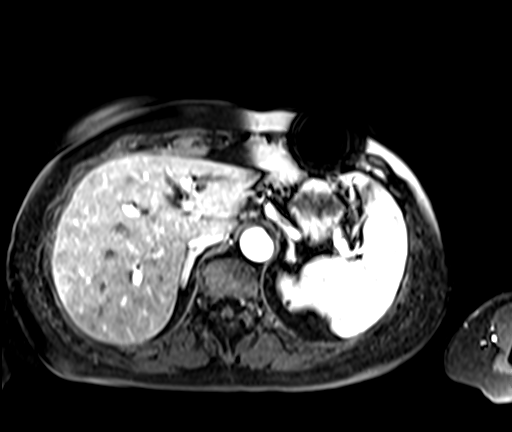
[im 80/80]
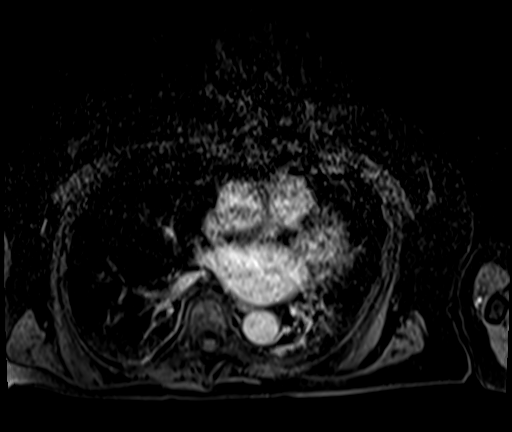

[Series 19: T1 dynamic post-contrast · axial · 2.5mm · 0.74mm/px · z∈[-66,+132]mm · 3 of 80 slices shown (2 of 2)]
[im 1/80]
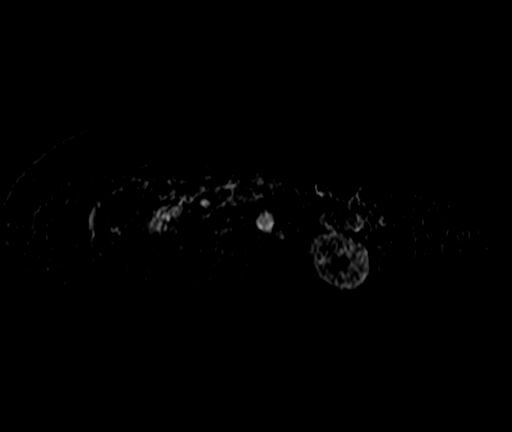
[im 40/80]
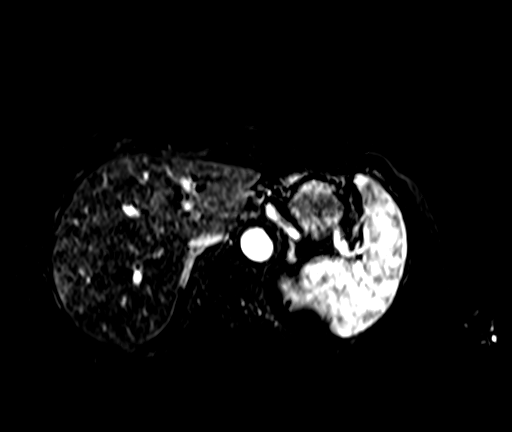
[im 80/80]
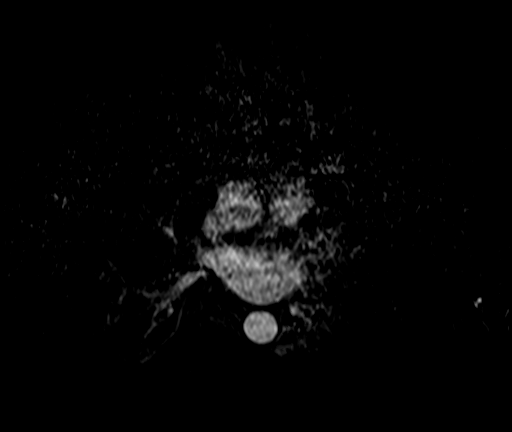

[25 of 48 positions shown; findings below may reference images not displayed]

FINDINGS: Lower chest: Lung bases are clear.

Hepatobiliary: 10 mm cyst in segment 2 (series 10/image 12), benign.
Liver is otherwise within normal limits. No suspicious/enhancing
hepatic lesions. No hepatic steatosis.

Status post cholecystectomy. Mild intrahepatic ductal dilatation.
Dilated common duct centrally, measuring 17 mm (series 12/image 48),
tapering at the ampulla. No choledocholithiasis is seen.

Pancreas:  Within normal limits.

Spleen:  Within normal limits.

Adrenals/Urinary Tract:  Adrenal glands are within normal limits.

13 x 11 x 15 mm solid enhancing lesion along the lateral right lower
kidney (series 10/image 26), suspicious for solid renal neoplasm
such as renal cell carcinoma.

Bilateral renal cysts, measuring up to 19 mm in the anterior left
lower kidney (series 10/image 22), benign (Bosniak I). No
hydronephrosis.

Stomach/Bowel: Stomach is within normal limits.

Visualized bowel is unremarkable.

Vascular/Lymphatic:  No evidence of abdominal aortic aneurysm.

No suspicious abdominal lymphadenopathy.

Other:  No abdominal ascites.

Musculoskeletal: Mild degenerative changes of the visualized
thoracolumbar spine.
IMPRESSION: Status post cholecystectomy. Mild intrahepatic ductal dilatation.
Dilated common duct, measuring 17 mm. No choledocholithiasis is
seen. This appearance raises concern for a distal CBD stricture;
correlate with LFTs and consider ERCP as clinically warranted.

15 mm solid enhancing lesion along the lateral right lower kidney,
suspicious for solid renal neoplasm such as renal cell carcinoma.
Urology consultation is suggested.

These results will be called to the ordering clinician or
representative by the Radiologist Assistant, and communication
documented in the PACS or [REDACTED].

## 2020-08-17 MED ORDER — GADOBENATE DIMEGLUMINE 529 MG/ML IV SOLN
13.0000 mL | Freq: Once | INTRAVENOUS | Status: AC | PRN
  Administered 2020-08-17: 13 mL via INTRAVENOUS

## 2020-08-21 ENCOUNTER — Other Ambulatory Visit: Payer: Medicare PPO

## 2020-08-31 DIAGNOSIS — Z01812 Encounter for preprocedural laboratory examination: Secondary | ICD-10-CM | POA: Diagnosis not present

## 2020-09-03 DIAGNOSIS — K319 Disease of stomach and duodenum, unspecified: Secondary | ICD-10-CM | POA: Diagnosis not present

## 2020-09-03 DIAGNOSIS — R1011 Right upper quadrant pain: Secondary | ICD-10-CM | POA: Diagnosis not present

## 2020-09-03 DIAGNOSIS — K3189 Other diseases of stomach and duodenum: Secondary | ICD-10-CM | POA: Diagnosis not present

## 2020-09-03 DIAGNOSIS — K449 Diaphragmatic hernia without obstruction or gangrene: Secondary | ICD-10-CM | POA: Diagnosis not present

## 2020-09-03 DIAGNOSIS — K2970 Gastritis, unspecified, without bleeding: Secondary | ICD-10-CM | POA: Diagnosis not present

## 2020-09-03 DIAGNOSIS — K921 Melena: Secondary | ICD-10-CM | POA: Diagnosis not present

## 2020-09-03 DIAGNOSIS — R1013 Epigastric pain: Secondary | ICD-10-CM | POA: Diagnosis not present

## 2020-09-08 DIAGNOSIS — K319 Disease of stomach and duodenum, unspecified: Secondary | ICD-10-CM | POA: Diagnosis not present

## 2020-09-18 ENCOUNTER — Other Ambulatory Visit: Payer: Self-pay | Admitting: Urology

## 2020-09-18 DIAGNOSIS — D49511 Neoplasm of unspecified behavior of right kidney: Secondary | ICD-10-CM | POA: Diagnosis not present

## 2020-09-18 DIAGNOSIS — N3000 Acute cystitis without hematuria: Secondary | ICD-10-CM | POA: Diagnosis not present

## 2020-09-18 DIAGNOSIS — R8271 Bacteriuria: Secondary | ICD-10-CM | POA: Diagnosis not present

## 2020-09-18 DIAGNOSIS — N2889 Other specified disorders of kidney and ureter: Secondary | ICD-10-CM

## 2020-09-23 ENCOUNTER — Other Ambulatory Visit: Payer: Self-pay

## 2020-09-23 ENCOUNTER — Other Ambulatory Visit (HOSPITAL_COMMUNITY): Payer: Self-pay | Admitting: Interventional Radiology

## 2020-09-23 ENCOUNTER — Encounter: Payer: Self-pay | Admitting: *Deleted

## 2020-09-23 ENCOUNTER — Ambulatory Visit
Admission: RE | Admit: 2020-09-23 | Discharge: 2020-09-23 | Disposition: A | Discharge status: Home / Self Care | Payer: Medicare PPO | Source: Ambulatory Visit | Attending: Urology | Admitting: Urology

## 2020-09-23 DIAGNOSIS — N2889 Other specified disorders of kidney and ureter: Secondary | ICD-10-CM

## 2020-09-23 HISTORY — PX: IR RADIOLOGIST EVAL & MGMT: IMG5224

## 2020-09-23 NOTE — Consult Note (Addendum)
Chief Complaint: Patient was consulted remotely today (TeleHealth) for right renal mass at the request of Pace,Maryellen D.    Referring Physician(s): Pace,Maryellen D  History of Present Illness: Kimberly Gay is a 80 y.o. female with a remote history of colon carcinoma status post resection, who had presented with right upper abdominal pain. 08/17/2020 abdominal MRI obtained to evaluate right upper abdominal pain and liver lesion.  Liver cyst and small bilateral renal cyst were identified, as well as a 1.5 cm enhancing right lower pole renal mass.  This is new since the most recent prior CT abdomen from 09/04/2003,  consistent with renal  neoplasm.  She had 2 days of gross hematuria which resolved with treatment of urinary tract infection.   Past Medical History:  Diagnosis Date  . Brain aneurysm 04/03/2019  . Cancer ([place]) 1994   colon-resection  . Cerebral aneurysm 04/03/2019  . Cerebral aneurysm, nonruptured 2010   not treated due to location-monitor yearly  . Dyspnea on exertion 04/03/2019  . GERD (gastroesophageal reflux disease)    infrequent  . Guaiac + stool 02/26/2020  . Hypertension    well controlled  . Incontinence of urine   . Joint pain, knee   . Palpitations 04/03/2019  . Paroxysmal atrial fibrillation (HCC) CHA2DS2-VASc equals 4, not anticoagulated so far because of history of intracranial bleed and aneurysm 10/28/2019  . Precordial chest pain 04/03/2019  . Urgency of urination    Atrial fibrillation status post watchman device placement, no longer requires anticoagulation   Past Surgical History:  Procedure Laterality Date  . ABDOMINAL HYSTERECTOMY    . ANEURYSM COILING  2010   cerebral coiling-no deficits  . BREAST EXCISIONAL BIOPSY Left 04/05/1994  . CHOLECYSTECTOMY    . CYSTOSCOPY  09/03/2012   Procedure: CYSTOSCOPY;  Surgeon: Ailene Rud, MD;  Location: [facility];  Service: Urology;  Laterality: N/A;  . FLEXIBLE SIGMOIDOSCOPY  N/A 11/24/2014   Procedure: FLEXIBLE SIGMOIDOSCOPY;  Surgeon: Garlan Fair, MD;  [facility];  Service: Endoscopy;  Laterality: N/A;  . LAMINECTOMY    . LYSIS OF ADHESION  2000's   with cholecystectomy procedure  . PILONIDAL CYST EXCISION    . PUBOVAGINAL SLING  09/03/2012   Procedure: Gaynelle Arabian;  Surgeon: Ailene Rud, MD;  Location[facility];  Service: Urology;  Laterality: N/A;  LYNX SLING     Allergies: Amoxicillin, Meperidine and related, and Wasp venom  Medications: Prior to Admission medications   Medication Sig Start Date End Date Taking? Authorizing Provider  acetaminophen (TYLENOL) 500 MG tablet Take 500 mg by mouth every 6 (six) hours as needed for mild pain.     [provider]  aspirin EC 81 MG tablet Take 1 tablet (81 mg total) by mouth daily. Swallow whole. 02/14/20   Park Liter, MD  cetirizine (ZYRTEC) 10 MG tablet Take 10 mg by mouth as needed.     [provider]  cholecalciferol (VITAMIN D) 1000 UNITS tablet Take 2,000 Units by mouth daily.     [provider]  clindamycin (CLEOCIN) 300 MG capsule Take 600 mg by mouth daily. Take 2 capsules before dentist appointment 03/25/20   [provider]  clopidogrel (PLAVIX) 75 MG tablet Take 75 mg by mouth daily. 05/14/20   [provider]  clotrimazole-betamethasone (LOTRISONE) cream Apply 1 application topically daily.    [provider]  EPINEPHrine (EPIPEN) 0.3 mg/0.3 mL SOAJ injection Inject 0.3 mLs (0.3 mg  total) into the muscle once. 08/09/13   Linton Flemings, MD  furosemide (LASIX) 20 MG tablet Take 20 mg by mouth daily.  02/02/19   [provider]  hydrocortisone 2.5 % cream Apply 2.5 application topically daily. 12/23/19   [provider]  metoprolol succinate (TOPROL-XL) 100 MG 24 hr tablet TAKE 1 TABLET BY MOUTH DAILY. TAKE WITH OR IMMEDIATELY FOLLOWING A MEAL. 08/10/20   Park Liter, MD   nitroGLYCERIN (NITROSTAT) 0.4 MG SL tablet Place 1 tablet (0.4 mg total) under the tongue every 5 (five) minutes as needed for chest pain. 04/03/19   Park Liter, MD  phenylephrine-shark liver oil-mineral oil-petrolatum (PREPARATION H) 0.25-3-14-71.9 % rectal ointment Place 1 application rectally daily.    [provider]  Polyethyl Glycol-Propyl Glycol 0.4-0.3 % SOLN Place 1 drop into both eyes in the morning and at bedtime.    [provider]     Family History  Problem Relation Age of Onset  . Diabetes Other   . Breast cancer Cousin   . Seizures Mother   . Heart attack Father     Social History   Socioeconomic History  . Marital status: Widowed    Spouse name: Not on file  . Number of children: Not on file  . Years of education: Not on file  . Highest education level: Not on file  Occupational History  . Not on file  Tobacco Use  . Smoking status: Never Smoker  . Smokeless tobacco: Never Used  Vaping Use  . Vaping Use: Never used  Substance and Sexual Activity  . Alcohol use: No  . Drug use: No  . Sexual activity: Not on file  Other Topics Concern  . Not on file  Social History Narrative  . Not on file   Social Determinants of Health   Financial Resource Strain: Not on file  Food Insecurity: Not on file  Transportation Needs: Not on file  Physical Activity: Not on file  Stress: Not on file  Social Connections: Not on file    ECOG Status: 0 - Asymptomatic  Review of Systems  Review of Systems: A 12 point ROS discussed and pertinent positives are indicated in the HPI above.  All other systems are negative.  Physical Exam No direct physical exam was performed (except for noted visual exam findings with Video Visits).     Vital Signs: There were no vitals taken for this visit.  Imaging:  MRI ABDOMEN WITHOUT AND WITH CONTRAST (INCLUDING MRCP)  TECHNIQUE: Multiplanar multisequence MR imaging of the abdomen was performed both  before and after the administration of intravenous contrast. Heavily T2-weighted images of the biliary and pancreatic ducts were obtained, and three-dimensional MRCP images were rendered by post processing.  CONTRAST: 46mL MULTIHANCE GADOBENATE DIMEGLUMINE 529 MG/ML IV SOLN  COMPARISON: CT abdomen/pelvis dated 09/04/2003  FINDINGS: Lower chest: Lung bases are clear.  Hepatobiliary: 10 mm cyst in segment 2 (series 10/image 12), benign. Liver is otherwise within normal limits. No suspicious/enhancing hepatic lesions. No hepatic steatosis.  Status post cholecystectomy. Mild intrahepatic ductal dilatation. Dilated common duct centrally, measuring 17 mm (series 12/image 48), tapering at the ampulla. No choledocholithiasis is seen.  Pancreas: Within normal limits.  Spleen: Within normal limits.  Adrenals/Urinary Tract: Adrenal glands are within normal limits.  13 x 11 x 15 mm solid enhancing lesion along the lateral right lower kidney (series 10/image 26), suspicious for solid renal neoplasm such as renal cell carcinoma.  Bilateral renal cysts, measuring up to  19 mm in the anterior left lower kidney (series 10/image 22), benign (Bosniak I). No hydronephrosis.  Stomach/Bowel: Stomach is within normal limits.  Visualized bowel is unremarkable.  Vascular/Lymphatic: No evidence of abdominal aortic aneurysm.  No suspicious abdominal lymphadenopathy.  Other: No abdominal ascites.  Musculoskeletal: Mild degenerative changes of the visualized thoracolumbar spine.  IMPRESSION: Status post cholecystectomy. Mild intrahepatic ductal dilatation. Dilated common duct, measuring 17 mm. No choledocholithiasis is seen. This appearance raises concern for a distal CBD stricture; correlate with LFTs and consider ERCP as clinically warranted.  15 mm solid enhancing lesion along the lateral right lower kidney, suspicious for solid renal neoplasm such as renal cell carcinoma. Urology  consultation is suggested.   Labs:  CBC: Recent Labs    10/28/19 1631 06/29/20 0957  WBC 10.1 8.2  HGB 15.4 14.9  HCT 45.9 44.5  PLT 231 243    COAGS: No results for input(s): INR, APTT in the last 8760 hours.  BMP: Recent Labs    10/28/19 1631  NA 140  K 3.7  CL 104  CO2 23  GLUCOSE 128*  BUN 18  CALCIUM 10.1  CREATININE 1.01*  GFRNONAA 53*  GFRAA 62    LIVER FUNCTION TESTS: No results for input(s): BILITOT, AST, ALT, ALKPHOS, PROT, ALBUMIN in the last 8760 hours.  TUMOR MARKERS: No results for input(s): AFPTM, CEA, CA199, CHROMGRNA in the last 8760 hours.  Assessment and Plan:    My impression is that this patient has a 1.5 cm right lower pole renal mass, new since 2005, concerning for   renal cell carcinoma.  We discussed this in detail and in regards to the spectrum of renal masses which includes cysts (pure cysts are considered benign), solid masses and everything in between. The risk of metastasis increases as the size of solid renal mass increases. In general, it is believed that the risk of metastasis for renal masses less than 3-4 cm is small (up to approximately 5%) based mainly on large retrospective studies. In some cases and especially in patients of older age and multiple comorbidities a surveillance approach may be appropriate.  Active surveillance could also be considered given that this lesion is less than 3 cm to help further assess growth rate, etc. The treatment of solid renal masses includes:  cryoablation (percutaneous and laparoscopic) in addition to partial and complete nephrectomy (each with option of laparoscopic, robotic and open depending on appropriateness). Furthermore, nephrectomy appears to be an independent risk factor for the development of chronic kidney disease suggesting that nephron sparing approaches should be implored whenever feasible. We reviewed these options in context of the patients current situation as well as the pros  and cons of each. We had   discussion today about the risk and benefits of each. We discussed in particular the percutaneous cryoablation procedure with CT guidance under anesthesia, anticipated benefits, possible risks and complications, expected postoperative course, likelihood of concurrent percutaneous core biopsy to get a pathologic specimen, and need for continued imaging follow-up X 5 years assuming this is demonstrated to be carcinoma. She seemed to understand and did ask appropriate questions. She had had a good discussion with Dr. Claudia Desanctis already about this. She is motivated to proceed.  She would rather not participate in active surveillance given her history of colon carcinoma. Accordingly, we can set her up for CT-guided RIGHT renal mass cryoablation and core biopsy under anesthesia at [facility] at her convenience.     Thank you for this interesting  consult.  I greatly enjoyed meeting Kimberly Gay and look forward to participating in their care.  A copy of this report was sent to the requesting provider on this date.  Electronically Signed: Rickard Rhymes 09/23/2020, 10:29 AM   I spent a total of  40 Minutes   in remote  clinical consultation, greater than 50% of which was counseling/coordinating care for right renal neoplasm.    Visit type: Audio and video (WebEx).   Alternative for in-person consultation at [facility], [address] [place], [place], [place]. This visit type was conducted due to national recommendations for restrictions regarding the COVID-19 Pandemic (e.g. social distancing).  This format is felt to be most appropriate for this patient at this time.  All issues noted in this document were discussed and addressed.

## 2020-10-08 DIAGNOSIS — H524 Presbyopia: Secondary | ICD-10-CM | POA: Diagnosis not present

## 2020-10-08 DIAGNOSIS — Z961 Presence of intraocular lens: Secondary | ICD-10-CM | POA: Diagnosis not present

## 2020-10-08 DIAGNOSIS — H5213 Myopia, bilateral: Secondary | ICD-10-CM | POA: Diagnosis not present

## 2020-10-08 DIAGNOSIS — H52203 Unspecified astigmatism, bilateral: Secondary | ICD-10-CM | POA: Diagnosis not present

## 2020-10-20 ENCOUNTER — Other Ambulatory Visit (HOSPITAL_COMMUNITY): Payer: Self-pay | Admitting: Interventional Radiology

## 2020-10-20 DIAGNOSIS — N2889 Other specified disorders of kidney and ureter: Secondary | ICD-10-CM

## 2020-11-03 ENCOUNTER — Other Ambulatory Visit: Payer: Self-pay | Admitting: Radiology

## 2020-11-03 NOTE — Progress Notes (Signed)
Called and request pre op orders. 

## 2020-11-06 NOTE — Progress Notes (Signed)
DUE TO COVID-19 ONLY ONE VISITOR IS ALLOWED TO COME WITH YOU AND STAY IN THE WAITING ROOM ONLY DURING PRE OP AND PROCEDURE DAY OF SURGERY. THE 1 VISITOR  MAY VISIT WITH YOU AFTER SURGERY IN YOUR PRIVATE ROOM DURING VISITING HOURS ONLY!  YOU NEED TO HAVE A COVID 19 TEST ON_4/06/2021 ______ @_______ , THIS TEST MUST BE DONE BEFORE SURGERY,  COVID TESTING SITE [address] [place] [address], IT IS ON THE RIGHT GOING OUT [address] APPROXIMATELY  2 MINUTES PAST [employer] ON THE RIGHT. ONCE YOUR COVID TEST IS COMPLETED,  PLEASE BEGIN THE QUARANTINE INSTRUCTIONS AS OUTLINED IN YOUR HANDOUT.                Kimberly Gay  11/06/2020   Your procedure is scheduled on:                           11/18/20   Report to [facility] Main  Entrance   Report to admitting at    [place] AM     Call this number if you have problems the morning of surgery [phone]    Remember: Do not eat food , candy gum or mints :After Midnight. You may have clear liquids from midnight until 0530am     CLEAR LIQUID DIET   Foods Allowed                                                                       Coffee and tea, regular and decaf                              Plain Jell-O any favor except red or purple                                            Fruit ices (not with fruit pulp)                                      Iced Popsicles                                     Carbonated beverages, regular and diet                                    Cranberry, grape and apple juices Sports drinks like Gatorade Lightly seasoned clear broth or consume(fat free) Sugar, honey syrup   _____________________________________________________________________    BRUSH YOUR TEETH MORNING OF SURGERY AND RINSE YOUR MOUTH OUT, NO CHEWING GUM CANDY OR MINTS.     Take these medicines the morning of surgery with A SIP OF WATER:      Metoprolol, protonix   DO NOT TAKE ANY DIABETIC MEDICATIONS DAY OF  YOUR SURGERY  You may not have any metal on your body including hair pins and              piercings  Do not wear jewelry, make-up, lotions, powders or perfumes, deodorant             Do not wear nail polish on your fingernails.  Do not shave  48 hours prior to surgery.              Men may shave face and neck.   Do not bring valuables to the hospital. [place].  Contacts, dentures or bridgework may not be worn into surgery.  Leave suitcase in the car. After surgery it may be brought to your room.     Patients discharged the day of surgery will not be allowed to drive home. IF YOU ARE HAVING SURGERY AND GOING HOME THE SAME DAY, YOU MUST HAVE AN ADULT TO DRIVE YOU HOME AND BE WITH YOU FOR 24 HOURS. YOU MAY GO HOME BY TAXI OR UBER OR ORTHERWISE, BUT AN ADULT MUST ACCOMPANY YOU HOME AND STAY WITH YOU FOR 24 HOURS.  Name and phone number of your driver:  Special Instructions: N/A              Please read over the following fact sheets you were given: _____________________________________________________________________  [facility] - Preparing for Surgery Before surgery, you can play an important role.  Because skin is not sterile, your skin needs to be as free of germs as possible.  You can reduce the number of germs on your skin by washing with CHG (chlorahexidine gluconate) soap before surgery.  CHG is an antiseptic cleaner which kills germs and bonds with the skin to continue killing germs even after washing. Please DO NOT use if you have an allergy to CHG or antibacterial soaps.  If your skin becomes reddened/irritated stop using the CHG and inform your nurse when you arrive at Short Stay. Do not shave (including legs and underarms) for at least 48 hours prior to the first CHG shower.  You may shave your face/neck. Please follow these instructions carefully:  1.  Shower with CHG Soap the night before surgery and  the  morning of Surgery.  2.  If you choose to wash your hair, wash your hair first as usual with your  normal  shampoo.  3.  After you shampoo, rinse your hair and body thoroughly to remove the  shampoo.                           4.  Use CHG as you would any other liquid soap.  You can apply chg directly  to the skin and wash                       Gently with a scrungie or clean washcloth.  5.  Apply the CHG Soap to your body ONLY FROM THE NECK DOWN.   Do not use on face/ open                           Wound or open sores. Avoid contact with eyes, ears mouth and genitals (private parts).  Wash face,  Genitals (private parts) with your normal soap.             6.  Wash thoroughly, paying special attention to the area where your surgery  will be performed.  7.  Thoroughly rinse your body with warm water from the neck down.  8.  DO NOT shower/wash with your normal soap after using and rinsing off  the CHG Soap.                9.  Pat yourself dry with a clean towel.            10.  Wear clean pajamas.            11.  Place clean sheets on your bed the night of your first shower and do not  sleep with pets. Day of Surgery : Do not apply any lotions/deodorants the morning of surgery.  Please wear clean clothes to the hospital/surgery center.  FAILURE TO FOLLOW THESE INSTRUCTIONS MAY RESULT IN THE CANCELLATION OF YOUR SURGERY PATIENT SIGNATURE_________________________________  NURSE SIGNATURE__________________________________  ________________________________________________________________________

## 2020-11-11 ENCOUNTER — Encounter (HOSPITAL_COMMUNITY)
Admission: RE | Admit: 2020-11-11 | Discharge: 2020-11-11 | Disposition: A | Discharge status: Home / Self Care | Payer: Medicare PPO | Source: Ambulatory Visit | Attending: Interventional Radiology | Admitting: Interventional Radiology

## 2020-11-11 ENCOUNTER — Encounter (HOSPITAL_COMMUNITY): Payer: Self-pay

## 2020-11-11 ENCOUNTER — Other Ambulatory Visit: Payer: Self-pay

## 2020-11-11 ENCOUNTER — Ambulatory Visit (HOSPITAL_COMMUNITY)
Admission: RE | Admit: 2020-11-11 | Discharge: 2020-11-11 | Disposition: A | Discharge status: Home / Self Care | Payer: Medicare PPO | Source: Ambulatory Visit | Attending: Radiology | Admitting: Radiology

## 2020-11-11 DIAGNOSIS — Z79899 Other long term (current) drug therapy: Secondary | ICD-10-CM | POA: Insufficient documentation

## 2020-11-11 DIAGNOSIS — Z01818 Encounter for other preprocedural examination: Secondary | ICD-10-CM

## 2020-11-11 DIAGNOSIS — I700 Atherosclerosis of aorta: Secondary | ICD-10-CM | POA: Insufficient documentation

## 2020-11-11 DIAGNOSIS — K219 Gastro-esophageal reflux disease without esophagitis: Secondary | ICD-10-CM | POA: Insufficient documentation

## 2020-11-11 DIAGNOSIS — I480 Paroxysmal atrial fibrillation: Secondary | ICD-10-CM | POA: Insufficient documentation

## 2020-11-11 DIAGNOSIS — I10 Essential (primary) hypertension: Secondary | ICD-10-CM | POA: Insufficient documentation

## 2020-11-11 DIAGNOSIS — N2889 Other specified disorders of kidney and ureter: Secondary | ICD-10-CM | POA: Insufficient documentation

## 2020-11-11 DIAGNOSIS — Z7982 Long term (current) use of aspirin: Secondary | ICD-10-CM | POA: Diagnosis not present

## 2020-11-11 HISTORY — DX: Cardiac arrhythmia, unspecified: I49.9

## 2020-11-11 HISTORY — DX: Other specified disorders of arteries and arterioles: I77.89

## 2020-11-11 HISTORY — DX: Cerebral infarction, unspecified: I63.9

## 2020-11-11 LAB — CBC WITH DIFFERENTIAL/PLATELET
Abs Immature Granulocytes: 0.02 10*3/uL (ref 0.00–0.07)
Basophils Absolute: 0.1 10*3/uL (ref 0.0–0.1)
Basophils Relative: 1 %
Eosinophils Absolute: 0.1 10*3/uL (ref 0.0–0.5)
Eosinophils Relative: 1 %
HCT: 43.8 % (ref 36.0–46.0)
Hemoglobin: 13.8 g/dL (ref 12.0–15.0)
Immature Granulocytes: 0 %
Lymphocytes Relative: 20 %
Lymphs Abs: 1.5 10*3/uL (ref 0.7–4.0)
MCH: 27.3 pg (ref 26.0–34.0)
MCHC: 31.5 g/dL (ref 30.0–36.0)
MCV: 86.7 fL (ref 80.0–100.0)
Monocytes Absolute: 0.5 10*3/uL (ref 0.1–1.0)
Monocytes Relative: 7 %
Neutro Abs: 5.4 10*3/uL (ref 1.7–7.7)
Neutrophils Relative %: 71 %
Platelets: 233 10*3/uL (ref 150–400)
RBC: 5.05 MIL/uL (ref 3.87–5.11)
RDW: 14.5 % (ref 11.5–15.5)
WBC: 7.6 10*3/uL (ref 4.0–10.5)
nRBC: 0 % (ref 0.0–0.2)

## 2020-11-11 LAB — BASIC METABOLIC PANEL
Anion gap: 8 (ref 5–15)
BUN: 23 mg/dL (ref 8–23)
CO2: 25 mmol/L (ref 22–32)
Calcium: 9.9 mg/dL (ref 8.9–10.3)
Chloride: 106 mmol/L (ref 98–111)
Creatinine, Ser: 0.89 mg/dL (ref 0.44–1.00)
GFR, Estimated: >60 mL/min (ref >60)
Glucose, Bld: 90 mg/dL (ref 70–99)
Potassium: 4 mmol/L (ref 3.5–5.1)
Sodium: 139 mmol/L (ref 135–145)

## 2020-11-11 LAB — PROTIME-INR
INR: 1.1 (ref 0.8–1.2)
Prothrombin Time: 13.5 seconds (ref 11.4–15.2)

## 2020-11-11 IMAGING — DX DG CHEST 1V
1 series · 1 of 1 positions shown · non-contrast
Comparison: Chest x-ray [DATE].

CLINICAL DATA: 79-year-old female under preoperative evaluation
prior to kidney biopsy.

EXAM:
CHEST  1 VIEW

[chest pa]
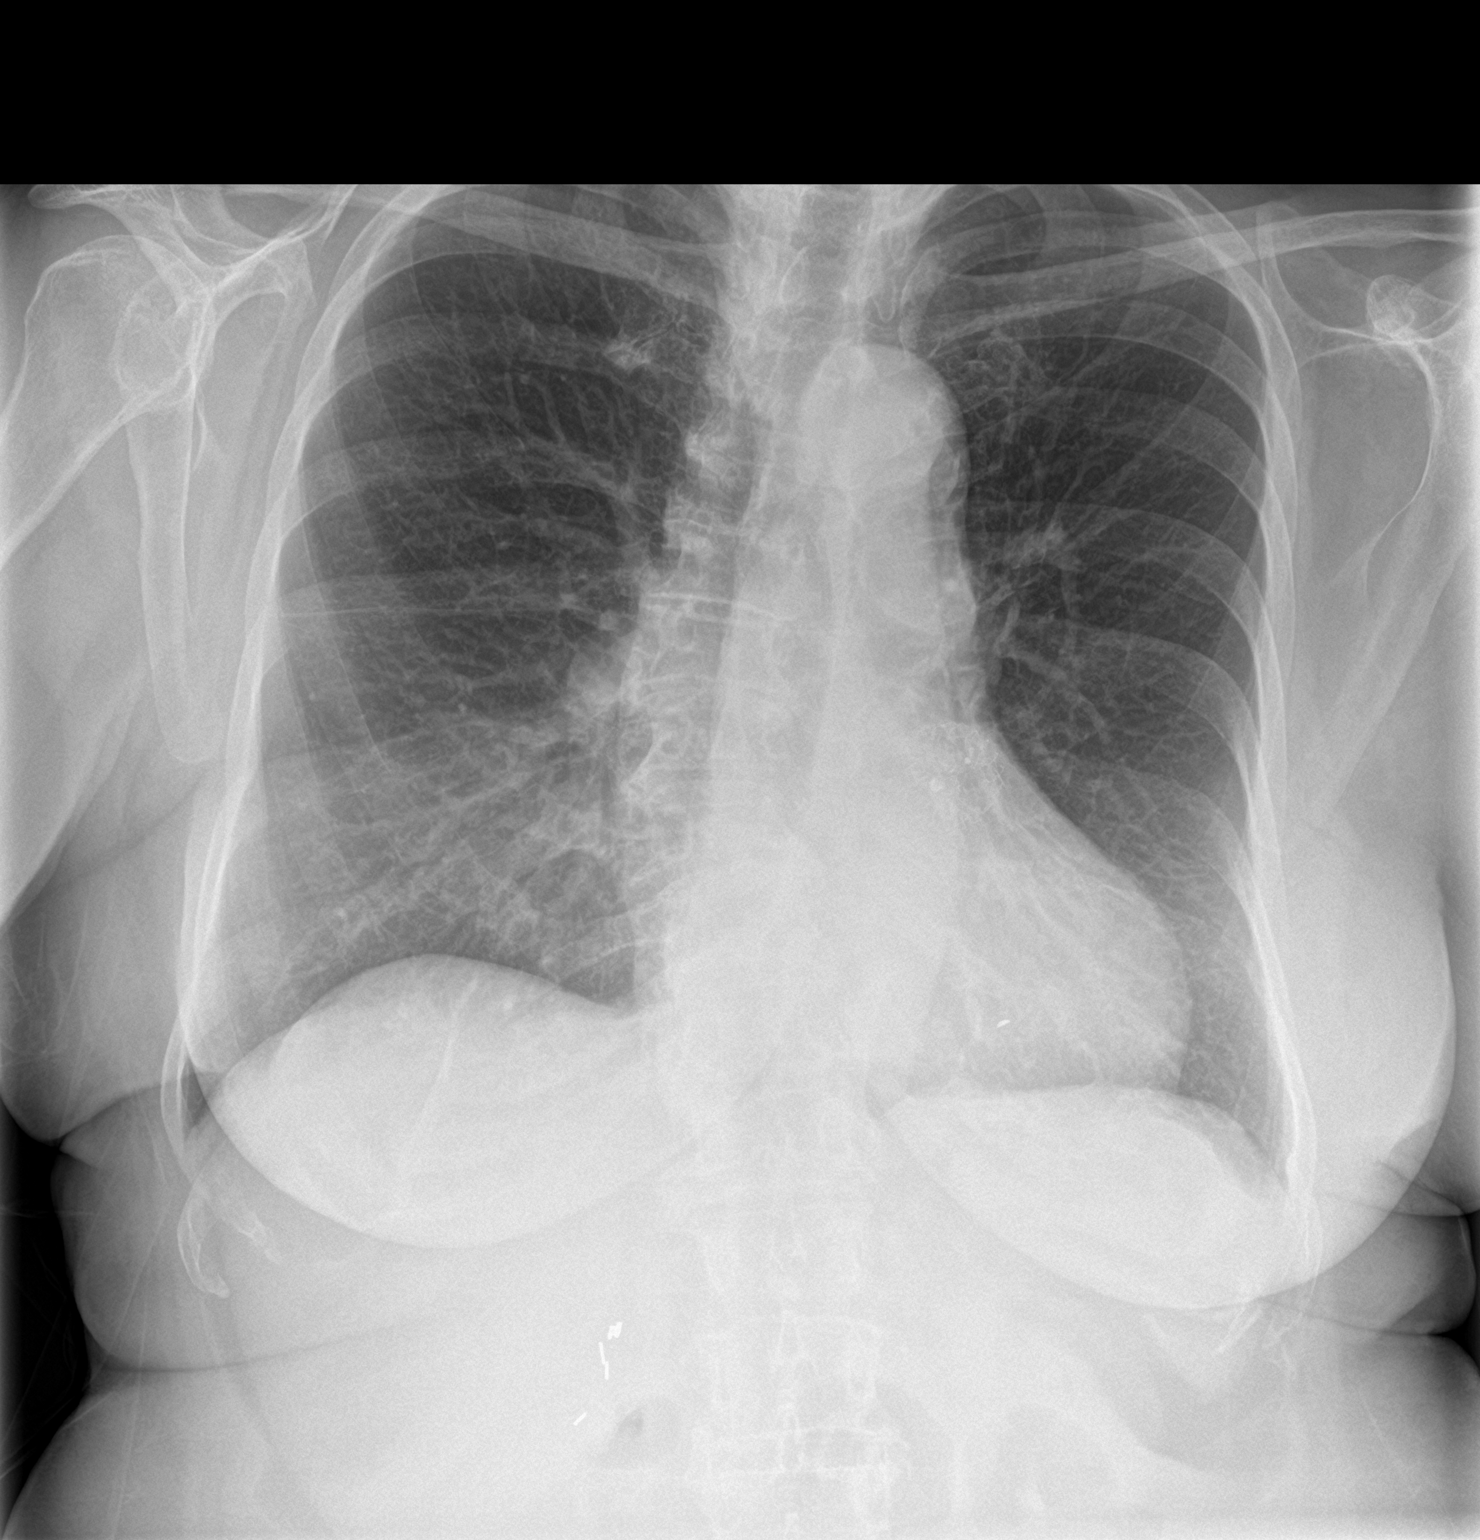

[1 of 1 positions shown; findings below may reference images not displayed]

FINDINGS: Lung volumes are normal. No consolidative airspace disease. No
pleural effusions. No pneumothorax. No pulmonary nodule or mass
noted. Pulmonary vasculature and the cardiomediastinal silhouette
are within normal limits. Atherosclerosis in the thoracic aorta.
Surgical clips project over the right upper quadrant of the abdomen,
likely from prior cholecystectomy.
IMPRESSION: 1.  No radiographic evidence of acute cardiopulmonary disease.
2. Aortic atherosclerosis.

## 2020-11-11 NOTE — Progress Notes (Signed)
Anesthesia Chart Review   Case: [id number] Date/Time: 11/18/20 0815   Procedure: IR WITH ANESTHESIA RENAL CRYOABLATION (Right )   Anesthesia type: General   Pre-op diagnosis: RIGHT RENAL MASS   [facility]   Surgeons: Arne Cleveland, MD      DISCUSSION:79 y.o. never smoker with h/o HTN, GERD, brain aneurysm s/p coiling, PAF (Watchman device implanted 8/21), right renal mass scheduled for above procedure 11/18/2020 with Dr. Arne Cleveland.    Last seen by cardiology 06/29/20, stable at this visit.  VS: BP (!) 156/67   Pulse (!) 59   Temp 37 C (Oral)   Resp 16   SpO2 100%   PROVIDERS: Rankins, Bill Salinas, MD is PCP   Jenne Campus, MD is Cardiologist  LABS: Labs reviewed: Acceptable for surgery. (all labs ordered are listed, but only abnormal results are displayed)  Labs Reviewed  CBC WITH DIFFERENTIAL/PLATELET  BASIC METABOLIC PANEL  PROTIME-INR  TYPE AND SCREEN     IMAGES:   EKG: 11/11/2020 Rate 52 bpm  Sinus bradycardia  Minimal voltage criteria for LVH, may be normal variant  CV: Echo 04/04/2019 IMPRESSIONS    1. The left ventricle has normal systolic function with an ejection  fraction of 60-65%. The cavity size was normal. Left ventricular diastolic  parameters were normal.  2. The right ventricle has normal systolic function. The cavity was  normal. There is no increase in right ventricular wall thickness.  3. Tricuspid valve regurgitation is mild-moderate.  4. The aorta is abnormal unless otherwise noted.  5. 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  Stress Test 04/08/2019  Nuclear stress EF: 64%. The left ventricular ejection fraction is normal (55-65%).  This is a low risk study. No evidence of ischemia or prevoius infarction  The study is normal.   Past Medical History:  Diagnosis Date  . 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 Brain aneurysm 04/03/2019  . Cancer ([place]) 1994   colon-resection  .  Cerebral aneurysm 04/03/2019  . Cerebral aneurysm, nonruptured 2010   not treated due to location-monitor yearly  . Dyspnea on exertion 04/03/2019  . Dysrhythmia    afib   . GERD (gastroesophageal reflux disease)    infrequent  . Guaiac + stool 02/26/2020  . Hypertension    well controlled  . Incontinence of urine   . Joint pain, knee   . Palpitations 04/03/2019  . Paroxysmal atrial fibrillation (HCC) CHA2DS2-VASc equals 4, not anticoagulated so far because of history of intracranial bleed and aneurysm 10/28/2019  . Precordial chest pain 04/03/2019  . Stroke [facility])    due to aneurysm rupture in 2010   . Urgency of urination     Past Surgical History:  Procedure Laterality Date  . ABDOMINAL HYSTERECTOMY    . ANEURYSM COILING  2010   cerebral coiling-no deficits  . BREAST EXCISIONAL BIOPSY Left 04/05/1994  . CHOLECYSTECTOMY    . CYSTOSCOPY  09/03/2012   Procedure: CYSTOSCOPY;  Surgeon: Ailene Rud, MD;  Location: [facility];  Service: Urology;  Laterality: N/A;  . FLEXIBLE SIGMOIDOSCOPY N/A 11/24/2014   Procedure: FLEXIBLE SIGMOIDOSCOPY;  Surgeon: Garlan Fair, MD;  [facility];  Service: Endoscopy;  Laterality: N/A;  . IR RADIOLOGIST EVAL & MGMT  09/23/2020  . LAMINECTOMY    . LYSIS OF ADHESION  2000's   with cholecystectomy procedure  . PILONIDAL CYST EXCISION    . PUBOVAGINAL SLING  09/03/2012   Procedure:  PUBO-VAGINAL SLING;  Surgeon: Ailene Rud, MD;  Location: [facility];  Service: Urology;  Laterality: N/A;  LYNX SLING     MEDICATIONS: . acetaminophen (TYLENOL) 500 MG tablet  . aspirin EC 81 MG tablet  . cetirizine (ZYRTEC) 10 MG tablet  . cholecalciferol (VITAMIN D) 1000 UNITS tablet  . clindamycin (CLEOCIN) 300 MG capsule  . EPINEPHrine (EPIPEN) 0.3 mg/0.3 mL SOAJ injection  . furosemide (LASIX) 20 MG tablet  . hydrocortisone 2.5 % cream  . metoprolol succinate (TOPROL-XL) 100 MG 24 hr tablet  .  nitroGLYCERIN (NITROSTAT) 0.4 MG SL tablet  . pantoprazole (PROTONIX) 40 MG tablet  . phenylephrine-shark liver oil-mineral oil-petrolatum (PREPARATION H) 0.25-3-14-71.9 % rectal ointment  . Polyethyl Glycol-Propyl Glycol 0.4-0.3 % SOLN   No current facility-administered medications for this encounter.     Konrad Felix, PA-C [facility]Surgical [facility] [phone]

## 2020-11-11 NOTE — Progress Notes (Signed)
Anesthesia Review:  PCP: DR Milagros Evener  Cardiologist : DR Agustin Cree LOV 06/29/20  03/25/2020 Watchman  DR Burnett Sheng OV 05/14/2020  Neurology- Dr Bridgette Habermann - 02/19/2018 LOV  Chest x-ray :11/11/20 EKG :11/11/2020  CT Heart with contrast 03/05/20   Echo :03/25/2020- Care Everywhere  Stress test:2020 Cardiac Cath :  Activity level: can do a flight of stairs without difficulty  Sleep Study/ CPAP :none  Fasting Blood Sugar :      / Checks Blood Sugar -- times a day:   Blood Thinner/ Instructions /Last Dose: ASA / Instructions/ Last Dose :  81 mg Aspirin

## 2020-11-16 ENCOUNTER — Other Ambulatory Visit (HOSPITAL_COMMUNITY)
Admission: RE | Admit: 2020-11-16 | Discharge: 2020-11-16 | Disposition: A | Discharge status: Home / Self Care | Payer: Medicare PPO | Source: Ambulatory Visit | Attending: Interventional Radiology | Admitting: Interventional Radiology

## 2020-11-16 DIAGNOSIS — Z01812 Encounter for preprocedural laboratory examination: Secondary | ICD-10-CM | POA: Insufficient documentation

## 2020-11-16 DIAGNOSIS — Z20822 Contact with and (suspected) exposure to covid-19: Secondary | ICD-10-CM | POA: Diagnosis not present

## 2020-11-17 ENCOUNTER — Other Ambulatory Visit: Payer: Self-pay | Admitting: Radiology

## 2020-11-17 ENCOUNTER — Other Ambulatory Visit: Payer: Self-pay | Admitting: Student

## 2020-11-17 LAB — SARS CORONAVIRUS 2 (TAT 6-24 HRS): SARS Coronavirus 2: NEGATIVE

## 2020-11-17 NOTE — Anesthesia Preprocedure Evaluation (Addendum)
Anesthesia Evaluation  Patient identified by MRN, date of birth, ID band Patient awake    Reviewed: Allergy & Precautions, NPO status , Patient's Chart, lab work & pertinent test results, reviewed documented beta blocker date and time   Airway Mallampati: II  TM Distance: >3 FB Neck ROM: Full    Dental  (+) Teeth Intact, Dental Advisory Given, Caps,    Pulmonary neg pulmonary ROS,    Pulmonary exam normal breath sounds clear to auscultation       Cardiovascular hypertension, Pt. on home beta blockers and Pt. on medications + Peripheral Vascular Disease  Normal cardiovascular exam+ dysrhythmias Atrial Fibrillation  Rhythm:Regular Rate:Normal  S/p watchmann   Neuro/Psych H/o cerebral aneurysm  CVA, No Residual Symptoms    GI/Hepatic Neg liver ROS, GERD  Medicated and Controlled,Colon ca s/p resection    Endo/Other  negative endocrine ROS  Renal/GU Right renal mass     Musculoskeletal negative musculoskeletal ROS (+)   Abdominal   Peds  Hematology negative hematology ROS (+)   Anesthesia Other Findings   Reproductive/Obstetrics                            Anesthesia Physical Anesthesia Plan  ASA: III  Anesthesia Plan: General   Post-op Pain Management:    Induction: Intravenous  PONV Risk Score and Plan: 4 or greater and Dexamethasone, Ondansetron and Treatment may vary due to age or medical condition  Airway Management Planned: Oral ETT  Additional Equipment:   Intra-op Plan:   Post-operative Plan: Extubation in OR  Informed Consent: I have reviewed the patients History and Physical, chart, labs and discussed the procedure including the risks, benefits and alternatives for the proposed anesthesia with the patient or authorized representative who has indicated his/her understanding and acceptance.     Dental advisory given  Plan Discussed with: CRNA  Anesthesia Plan  Comments:        Anesthesia Quick Evaluation

## 2020-11-18 ENCOUNTER — Encounter (HOSPITAL_COMMUNITY): Payer: Self-pay | Admitting: Interventional Radiology

## 2020-11-18 ENCOUNTER — Ambulatory Visit (HOSPITAL_COMMUNITY): Payer: Medicare PPO | Admitting: Certified Registered Nurse Anesthetist

## 2020-11-18 ENCOUNTER — Encounter (HOSPITAL_COMMUNITY): Payer: Self-pay

## 2020-11-18 ENCOUNTER — Ambulatory Visit (HOSPITAL_COMMUNITY)
Admission: RE | Admit: 2020-11-18 | Discharge: 2020-11-18 | Disposition: A | Discharge status: Home / Self Care | Payer: Medicare PPO | Source: Ambulatory Visit | Attending: Interventional Radiology | Admitting: Interventional Radiology

## 2020-11-18 ENCOUNTER — Observation Stay (HOSPITAL_COMMUNITY)
Admission: RE | Admit: 2020-11-18 | Discharge: 2020-11-19 | Disposition: A | Discharge status: Home / Self Care | Payer: Medicare PPO | Source: Ambulatory Visit | Attending: Interventional Radiology | Admitting: Interventional Radiology

## 2020-11-18 ENCOUNTER — Ambulatory Visit (HOSPITAL_COMMUNITY): Payer: Medicare PPO

## 2020-11-18 ENCOUNTER — Ambulatory Visit (HOSPITAL_COMMUNITY): Payer: Medicare PPO | Admitting: Physician Assistant

## 2020-11-18 ENCOUNTER — Encounter (HOSPITAL_COMMUNITY)
Admission: RE | Disposition: A | Discharge status: Home / Self Care | Payer: Self-pay | Source: Ambulatory Visit | Attending: Interventional Radiology

## 2020-11-18 DIAGNOSIS — Z880 Allergy status to penicillin: Secondary | ICD-10-CM | POA: Diagnosis not present

## 2020-11-18 DIAGNOSIS — N2889 Other specified disorders of kidney and ureter: Secondary | ICD-10-CM

## 2020-11-18 DIAGNOSIS — Z888 Allergy status to other drugs, medicaments and biological substances status: Secondary | ICD-10-CM | POA: Insufficient documentation

## 2020-11-18 DIAGNOSIS — Z7982 Long term (current) use of aspirin: Secondary | ICD-10-CM | POA: Insufficient documentation

## 2020-11-18 DIAGNOSIS — D49511 Neoplasm of unspecified behavior of right kidney: Secondary | ICD-10-CM | POA: Diagnosis not present

## 2020-11-18 DIAGNOSIS — Z8669 Personal history of other diseases of the nervous system and sense organs: Secondary | ICD-10-CM | POA: Diagnosis not present

## 2020-11-18 DIAGNOSIS — Z90710 Acquired absence of both cervix and uterus: Secondary | ICD-10-CM | POA: Insufficient documentation

## 2020-11-18 DIAGNOSIS — N99820 Postprocedural hemorrhage and hematoma of a genitourinary system organ or structure following a genitourinary system procedure: Secondary | ICD-10-CM | POA: Diagnosis not present

## 2020-11-18 DIAGNOSIS — Z85038 Personal history of other malignant neoplasm of large intestine: Secondary | ICD-10-CM | POA: Diagnosis not present

## 2020-11-18 DIAGNOSIS — I480 Paroxysmal atrial fibrillation: Secondary | ICD-10-CM | POA: Insufficient documentation

## 2020-11-18 DIAGNOSIS — I10 Essential (primary) hypertension: Secondary | ICD-10-CM | POA: Insufficient documentation

## 2020-11-18 DIAGNOSIS — Z79899 Other long term (current) drug therapy: Secondary | ICD-10-CM | POA: Diagnosis not present

## 2020-11-18 DIAGNOSIS — Z9049 Acquired absence of other specified parts of digestive tract: Secondary | ICD-10-CM | POA: Diagnosis not present

## 2020-11-18 DIAGNOSIS — Z8673 Personal history of transient ischemic attack (TIA), and cerebral infarction without residual deficits: Secondary | ICD-10-CM | POA: Insufficient documentation

## 2020-11-18 DIAGNOSIS — I671 Cerebral aneurysm, nonruptured: Secondary | ICD-10-CM | POA: Diagnosis not present

## 2020-11-18 HISTORY — PX: IR RENAL SUPRASEL UNI S&I MOD SED: IMG655

## 2020-11-18 HISTORY — PX: RADIOLOGY WITH ANESTHESIA: SHX6223

## 2020-11-18 HISTORY — PX: IR EMBO ART  VEN HEMORR LYMPH EXTRAV  INC GUIDE ROADMAPPING: IMG5450

## 2020-11-18 HISTORY — PX: IR US GUIDE VASC ACCESS RIGHT: IMG2390

## 2020-11-18 LAB — TYPE AND SCREEN
ABO/RH(D): A POS
Antibody Screen: NEGATIVE

## 2020-11-18 LAB — CBC WITH DIFFERENTIAL/PLATELET
Abs Immature Granulocytes: 0.03 10*3/uL (ref 0.00–0.07)
Abs Immature Granulocytes: 0.03 10*3/uL (ref 0.00–0.07)
Basophils Absolute: 0 10*3/uL (ref 0.0–0.1)
Basophils Absolute: 0 10*3/uL (ref 0.0–0.1)
Basophils Relative: 0 %
Basophils Relative: 0 %
Eosinophils Absolute: 0 10*3/uL (ref 0.0–0.5)
Eosinophils Absolute: 0 10*3/uL (ref 0.0–0.5)
Eosinophils Relative: 0 %
Eosinophils Relative: 0 %
HCT: 45.6 % (ref 36.0–46.0)
HCT: 39.8 % (ref 36.0–46.0)
Hemoglobin: 14.2 g/dL (ref 12.0–15.0)
Hemoglobin: 12.6 g/dL (ref 12.0–15.0)
Immature Granulocytes: 0 %
Immature Granulocytes: 0 %
Lymphocytes Relative: 5 %
Lymphocytes Relative: 6 %
Lymphs Abs: 0.5 10*3/uL — ABNORMAL LOW (ref 0.7–4.0)
Lymphs Abs: 0.6 10*3/uL — ABNORMAL LOW (ref 0.7–4.0)
MCH: 27 pg (ref 26.0–34.0)
MCH: 27.2 pg (ref 26.0–34.0)
MCHC: 31.1 g/dL (ref 30.0–36.0)
MCHC: 31.7 g/dL (ref 30.0–36.0)
MCV: 86.9 fL (ref 80.0–100.0)
MCV: 86 fL (ref 80.0–100.0)
Monocytes Absolute: 0.1 10*3/uL (ref 0.1–1.0)
Monocytes Absolute: 0.4 10*3/uL (ref 0.1–1.0)
Monocytes Relative: 1 %
Monocytes Relative: 4 %
Neutro Abs: 9.2 10*3/uL — ABNORMAL HIGH (ref 1.7–7.7)
Neutro Abs: 9.6 10*3/uL — ABNORMAL HIGH (ref 1.7–7.7)
Neutrophils Relative %: 94 %
Neutrophils Relative %: 90 %
Platelets: 206 10*3/uL (ref 150–400)
Platelets: 202 10*3/uL (ref 150–400)
RBC: 5.25 MIL/uL — ABNORMAL HIGH (ref 3.87–5.11)
RBC: 4.63 MIL/uL (ref 3.87–5.11)
RDW: 14.4 % (ref 11.5–15.5)
RDW: 14.3 % (ref 11.5–15.5)
WBC: 9.9 10*3/uL (ref 4.0–10.5)
WBC: 10.6 10*3/uL — ABNORMAL HIGH (ref 4.0–10.5)
nRBC: 0 % (ref 0.0–0.2)
nRBC: 0 % (ref 0.0–0.2)

## 2020-11-18 LAB — ABO/RH: ABO/RH(D): A POS

## 2020-11-18 IMAGING — CT CT GUIDANCE TISSUE ABLATION
2 of 27 series · 5 of 46 positions shown, 9 images · non-contrast
Comparison: MRI abdomen [DATE]

INDICATION: 79-year-old woman with solid enhancing right renal mass presents to
interventional radiology for biopsy and ablation.

EXAM:
1. CT-guided cryoablation of solid enhancing right renal mass
2. CT-guided biopsy of solid enhancing right renal mass
TECHNIQUE: Informed written consent was obtained from the patient after a
thorough discussion of the procedural risks, benefits and
alternatives. All questions were addressed. Maximal Sterile Barrier
Technique was utilized including caps, mask, sterile gowns, sterile
gloves, sterile drape, hand hygiene and skin antiseptic. A timeout
was performed prior to the initiation of the procedure.

[Series 36: axial st · axial · 0.80mm/px · z∈[-221,-31]mm · 2 of 39 slices shown, 5 images]
[im 1/39  soft-tissue]
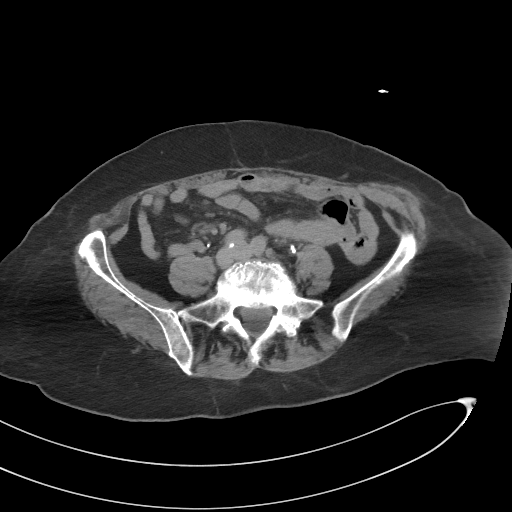
[im 1/39  lung]
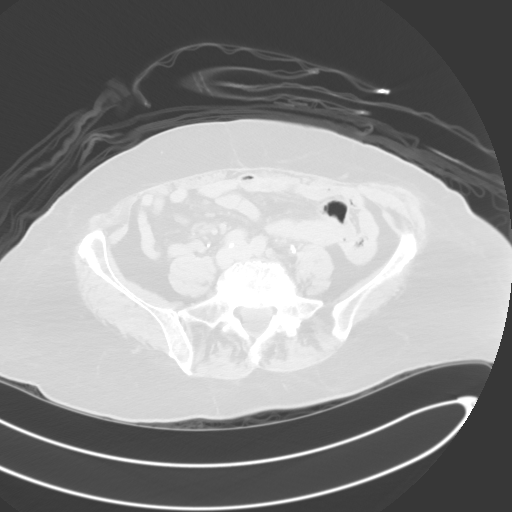
[im 1/39  bone]
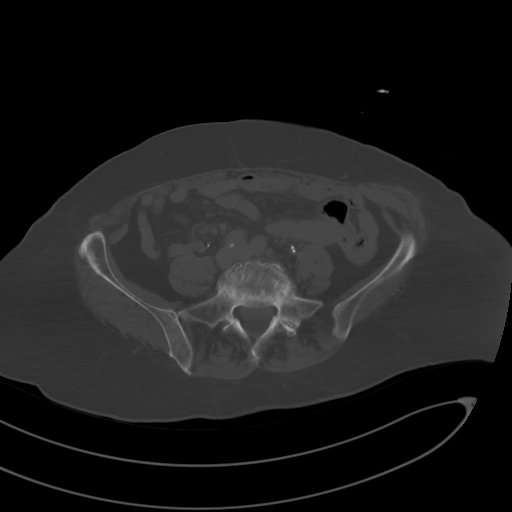
[im 39/39  soft-tissue]
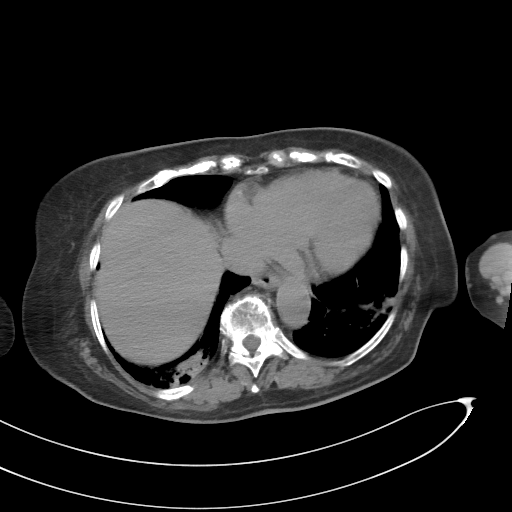
[im 39/39  lung]
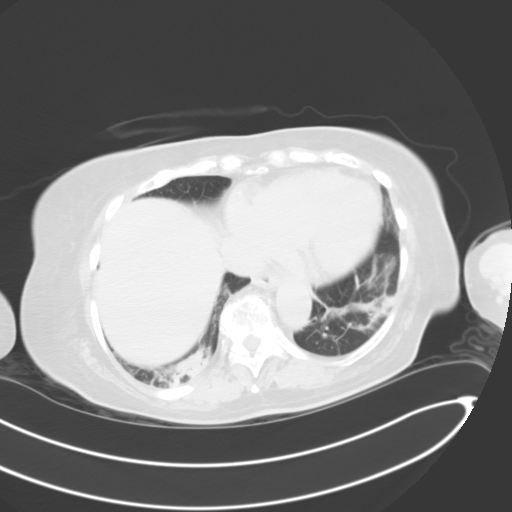

[Series 602: coronals · coronal · 0.84mm/px · 3 of 101 slices shown, 4 images]
[im 21/101  soft-tissue]
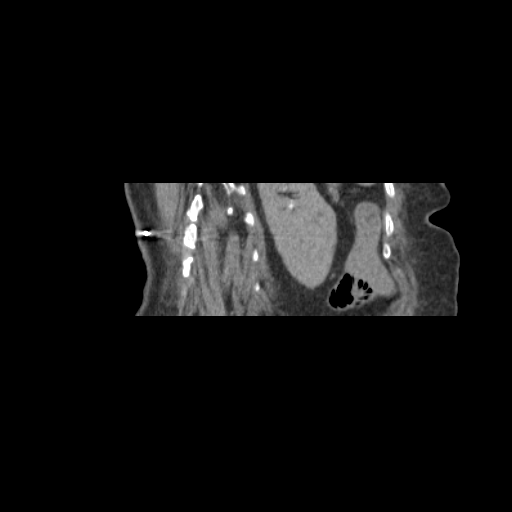
[im 41/101  soft-tissue]
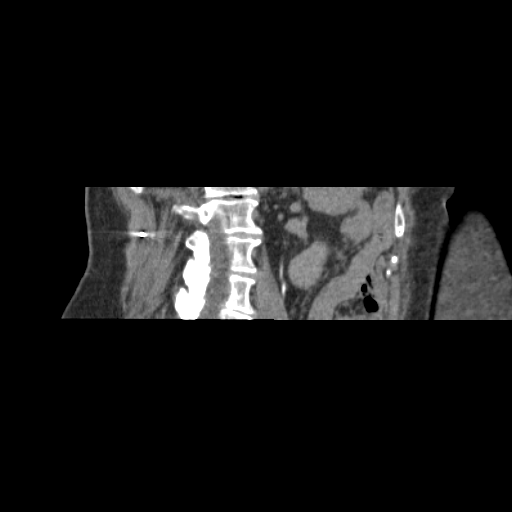
[im 41/101  bone]
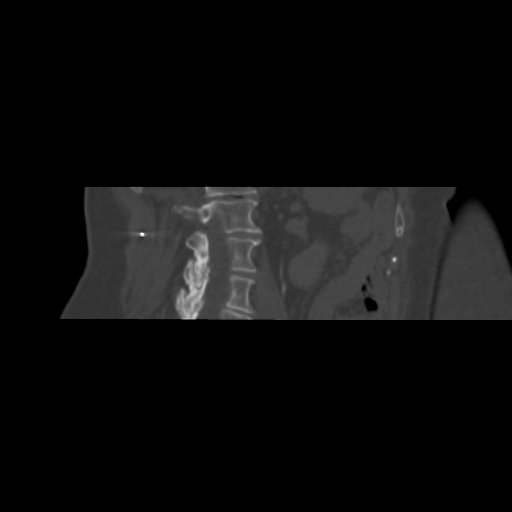
[im 61/101  soft-tissue]
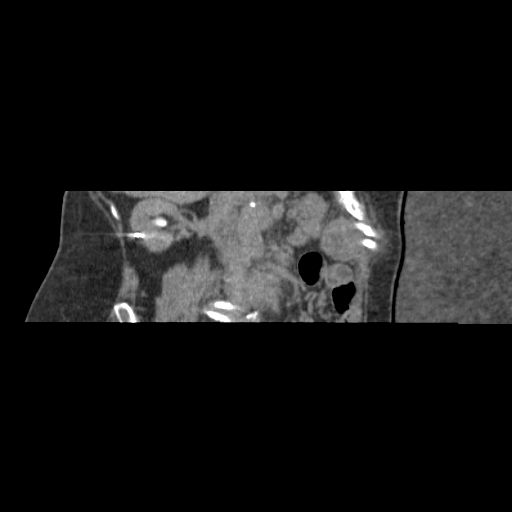

[5 of 46 positions shown; findings below may reference images not displayed]

MEDICATIONS:
None

ANESTHESIA/SEDATION:
General - as administered by the Anesthesia department

COMPLICATIONS:
SIR LEVEL C - Requires therapy, minor hospitalization (<48 hrs).
Patient positioned prone on the CT table. The right flank was
prepped and draped in the usual sterile fashion.

Contrast enhanced CT performed to better delineate the lesion.

Following local lidocaine administration, 1 Ice Force Cyro probe was
inserted in into the lesion utilizing CT guidance.

17 gauge biopsy introducer needle was inserted into the lateral
portion of the lesion parallel to the ablation probe utilizing CT
guidance. 218 gauge cores were obtained from the lesion. The
introducer needle was removed.

Cryoablation was then performed with 10 minutes of freezing followed
by 5 minutes of thawing and finally 10 additional minutes of
freezing.

Non-contrast CT was performed at 8 minutes into the second freezing
cycle to demonstrate maximum ice ball size.

5 minute thaw was performed followed by cauterization of all
needles. Needles were sequentially removed with care not to avulse
the ice ball.

Post treatment contrast enhanced CT scan demonstrated rapidly
expanding hematoma with active extravasation.

The patient was extubated and transferred to angiography suite for
embolization.
IMPRESSION: 1. CT-guided cryoablation of enhancing solid right renal mass.
2. CT-guided biopsy of solid enhancing right renal mass prior to
cryoablation.
3. Post-procedure complication of rapidly expanding hematoma.
Patient transferred to interventional radiology for angiogram and
embolization.

## 2020-11-18 IMAGING — XA IR RENAL SUPRASEL UNI S&I MOD SED
11 of 14 series · 14 of 24 positions shown · non-contrast
Comparison: none

INDICATION: 79-year-old female with history of right renal mass status post
cryoablation complicated by immediate postprocedural expanding
perinephric hemorrhage.

[Series 2: care body 4 · 1 of 19 frames shown (1 of 5)]
[frame 3/19]
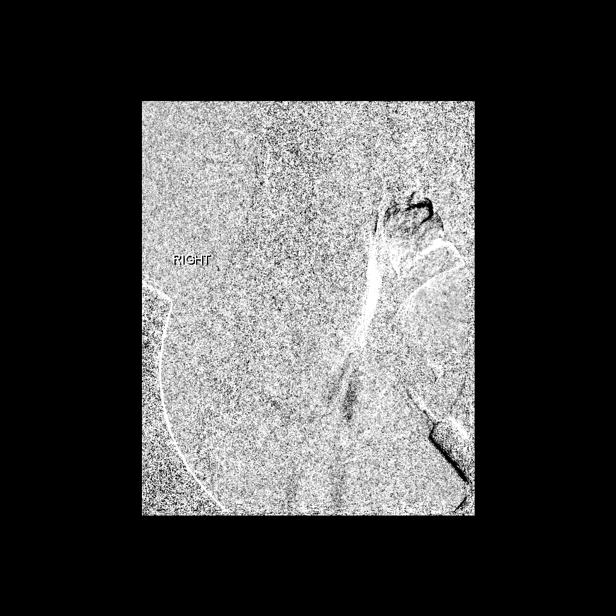

[Series 3: care body 4 · 1 of 33 frames shown (2 of 5)]
[frame 17/33]
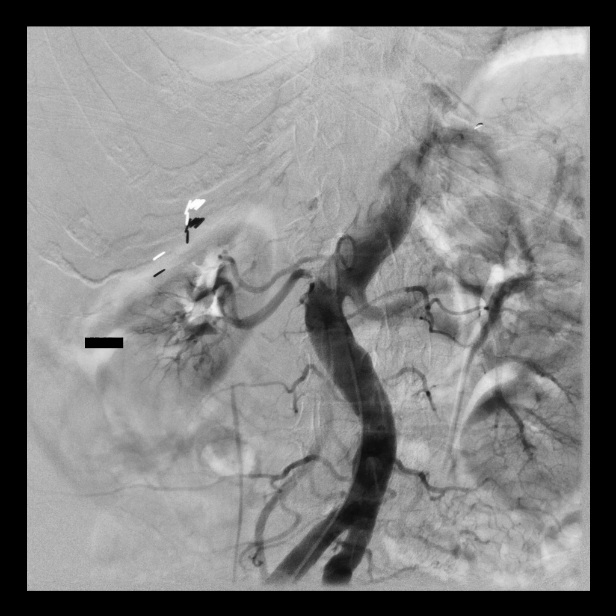

[Series 4: fl - angio · 1 of 24 frames shown (1 of 5)]
[frame 21/24]
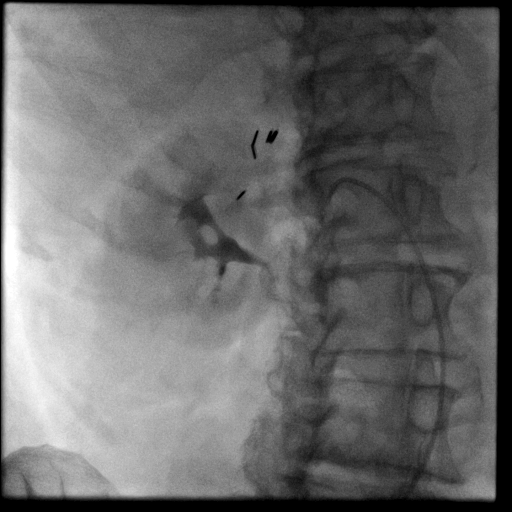

[Series 6: fl - angio · 2 of 48 frames shown (2 of 5)]
[frame 8/48]
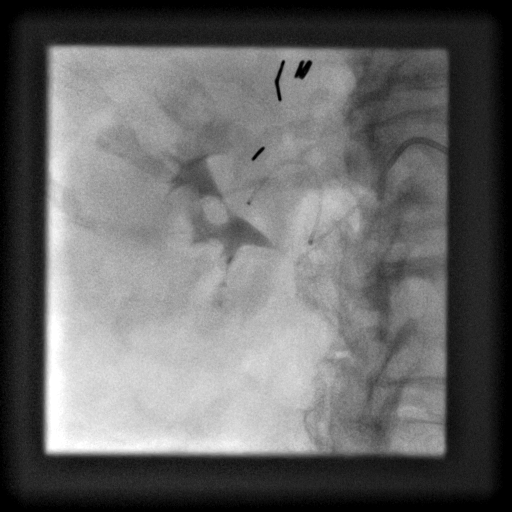
[frame 47/48]
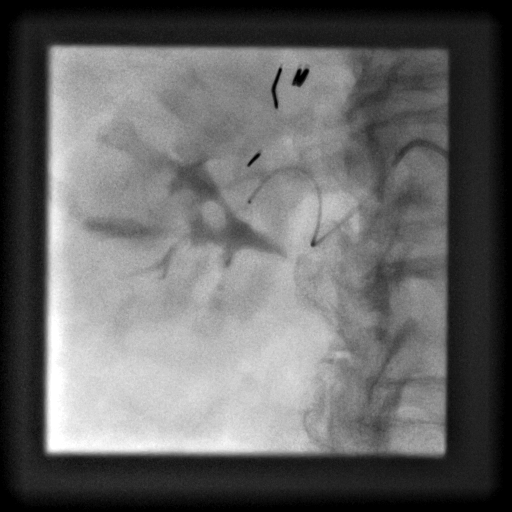

[Series 8: care body 4 · 1 of 57 frames shown (3 of 5)]
[frame 9/57]
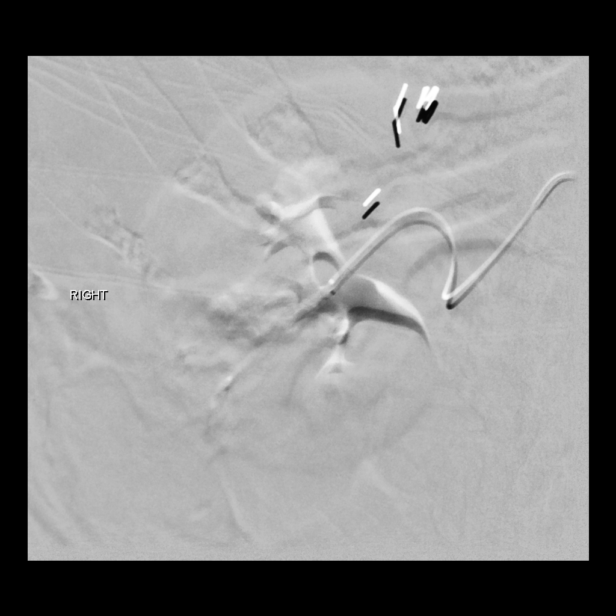

[Series 9: fl - angio · 1 of 99 frames shown (3 of 5)]
[frame 50/99]
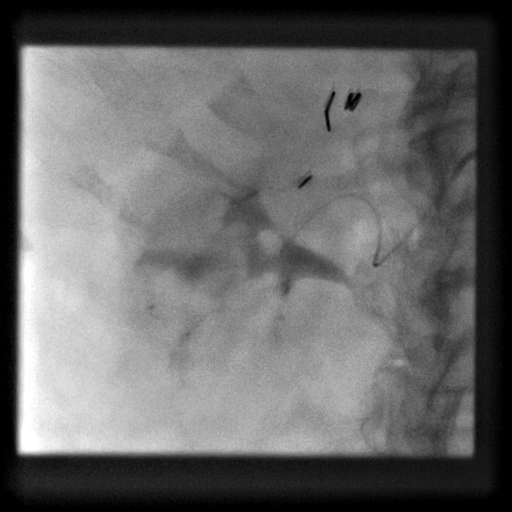

[Series 10: fl - angio · 1 of 26 frames shown (4 of 5)]
[frame 4/26]
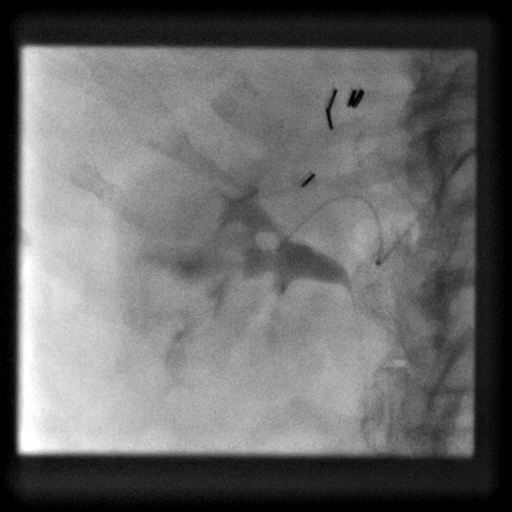

[Series 11: care body 4 · 1 of 51 frames shown (4 of 5)]
[frame 26/51]
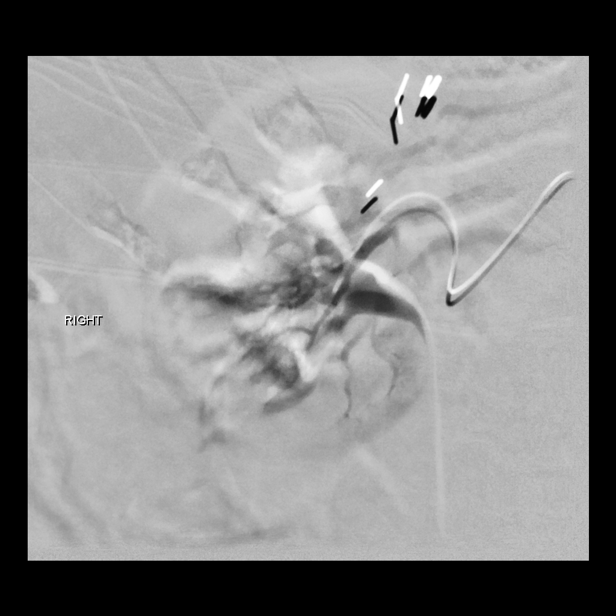

[Series 12: fl - angio · 1 of 91 frames shown (5 of 5)]
[frame 46/91]
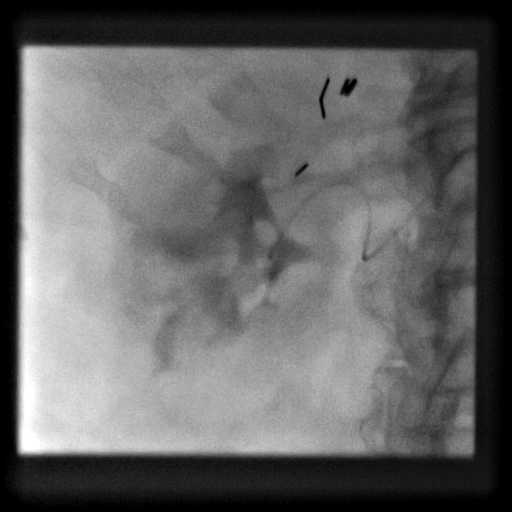

[Series 14: care body 4 · 2 of 48 frames shown (5 of 5)]
[frame 8/48]
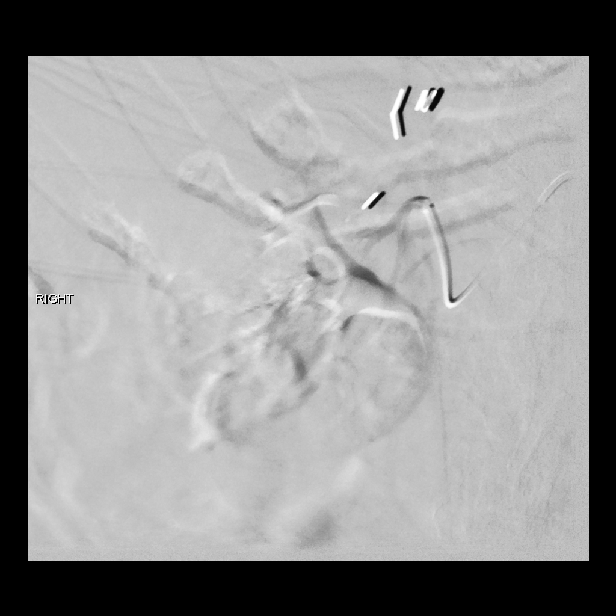
[frame 41/48]
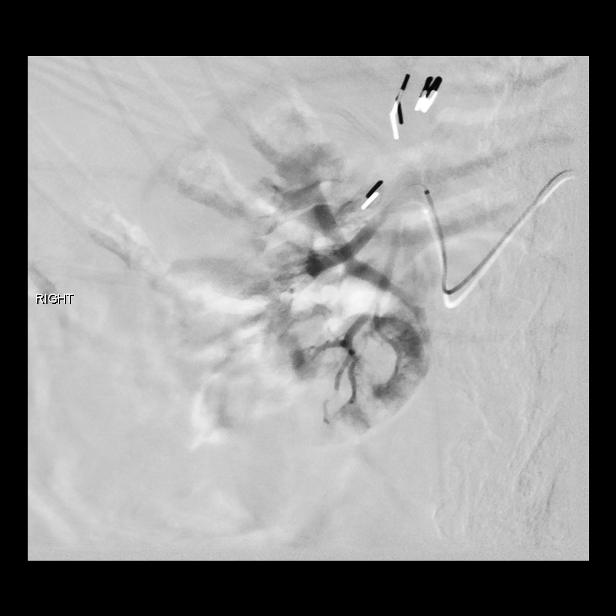

[Series 300: ir renal selective  uni inc s&i mod sed · 2 of 10 slices shown]
[im 5/10]
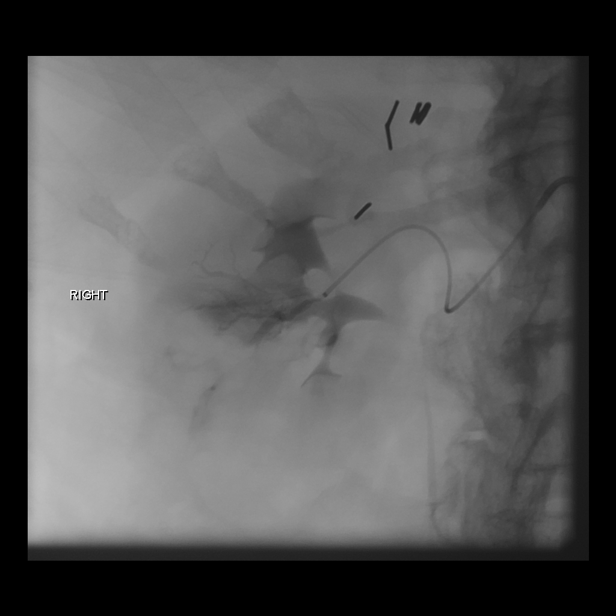
[im 10/10]
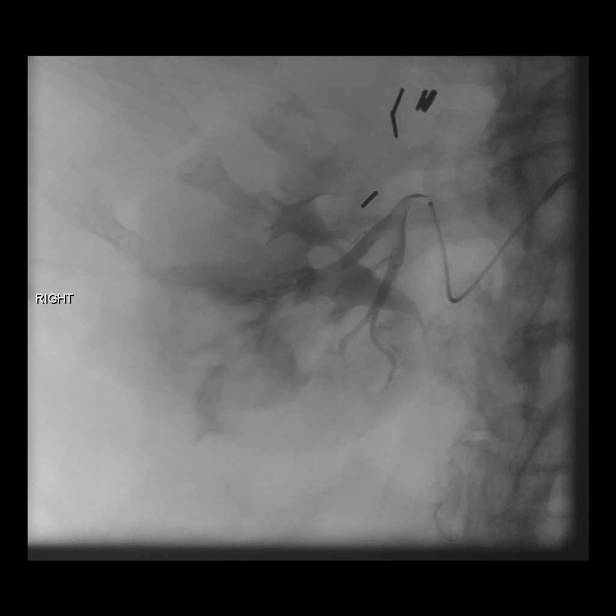

[14 of 24 positions shown; findings below may reference images not displayed]

EXAM:
1. Ultrasound-guided vascular access the right common femoral artery
2. Catheterization of the abdominal aorta
3. Abdominal aortogram
4. Selective catheterization and angiogram of the right renal artery
5. Sub selective catheterization and angiogram of right inferior
segmental renal artery
6. Gel-Foam embolization of right inferior segmental renal artery

MEDICATIONS:
None.

ANESTHESIA/SEDATION:
Moderate (conscious) sedation was employed during this procedure. A
total of Versed 0.5 mg and Fentanyl 50 mcg was administered
intravenously.

Moderate Sedation Time: 52 minutes. The patient's level of
consciousness and vital signs were monitored continuously by
radiology nursing throughout the procedure under my direct
supervision.

CONTRAST:  42mL OMNIPAQUE IOHEXOL 300 MG/ML SOLN, 30mL OMNIPAQUE
IOHEXOL 300 MG/ML SOLN

FLUOROSCOPY TIME:  Fluoroscopy Time: 6 minutes 54 seconds (654 mGy).

COMPLICATIONS:
None immediate.



The right groin was prepped and draped in standard fashion.
Preprocedure ultrasound evaluation demonstrated patent right common
femoral artery without significant atherosclerotic burden. The
procedure was planned. Subdermal Local anesthesia was provided with
1% lidocaine. A small skin nick was made. Under direct ultrasound
visualization, a 21 gauge micropuncture needle was directed into the
common femoral artery. A micropuncture set was inserted and a
limited right lower extremity angiogram was performed which
demonstrated adequate puncture site for use of a closure device.

A J wire was directed to the suprarenal abdominal aorta. A 5 French,
11 cm vascular sheath was placed and connected to a pressurized
heparinized saline flush. A pigtail catheter was then inserted over
the wire and the wire removed. Abdominal angiogram was performed.
Angiogram demonstrates patent celiac trunk, dual right and single
left renal arteries, SMA, and aortic bifurcation. The aorta is
tortuous but normal in caliber.

The inferior right renal artery was then selected with a C2
catheter. Right renal angiogram was then performed. No
pseudoaneurysm or obvious direct hemorrhage was identified. There is
a paucity of distal arterial is in the right inferior kidney at the
site of cryoablation. A Progreat Omega and 0.014 inch synchro soft
wire within directed into an inferior segmental renal artery.
Additional renal angiogram was performed which demonstrated
scattered focal hyperemia about the ablation zone. The central,
interlobar branches to the treated mass were stagnant.

Given the hyperemia and irregularity of the vessels, decision was
made to provide Gel-Foam slurry embolization. Approximately 2 mL of
diluted Gel-Foam slurry or administered 2 separate branches of the
right inferior segmental renal artery to an endpoint of near
stagnation. No reflux was observed. The microcatheter was retracted
into the proximal renal artery and repeat angiogram was again
performed. There was pruning of the inferior pole renal arteries
without evidence of active extravasation.

The catheters were removed. The right groin sheath was exchanged
over a J wire for a 6 French Angio-Seal closure device which was
deployed without complication. The patient tolerated the procedure
well was transferred to the postanesthesia care unit in good
condition.
IMPRESSION: 1. Post ablation changes to the inferior pole of the right kidney
without definite pseudoaneurysm or active extravasation visualized.
2. Successful Gel-Foam embolization of the right inferior segmental
renal artery.

## 2020-11-18 SURGERY — IR WITH ANESTHESIA
Anesthesia: General | Laterality: Right

## 2020-11-18 MED ORDER — POLYVINYL ALCOHOL 1.4 % OP SOLN
1.0000 [drp] | Freq: Two times a day (BID) | OPHTHALMIC | Status: DC
  Administered 2020-11-18 – 2020-11-19 (×2): 1 [drp] via OPHTHALMIC
  Filled 2020-11-18: qty 15

## 2020-11-18 MED ORDER — FENTANYL CITRATE (PF) 100 MCG/2ML IJ SOLN
INTRAMUSCULAR | Status: AC
  Filled 2020-11-18: qty 4

## 2020-11-18 MED ORDER — IOHEXOL 300 MG/ML  SOLN
100.0000 mL | Freq: Once | INTRAMUSCULAR | Status: AC | PRN
  Administered 2020-11-18: 42 mL via INTRA_ARTERIAL

## 2020-11-18 MED ORDER — SUGAMMADEX SODIUM 200 MG/2ML IV SOLN
INTRAVENOUS | Status: DC | PRN
  Administered 2020-11-18: 200 mg via INTRAVENOUS

## 2020-11-18 MED ORDER — ONDANSETRON HCL 4 MG/2ML IJ SOLN: 4.0000 mg | Freq: Four times a day (QID) | INTRAMUSCULAR | Status: DC | PRN

## 2020-11-18 MED ORDER — MIDAZOLAM HCL 2 MG/2ML IJ SOLN
INTRAMUSCULAR | Status: AC
  Filled 2020-11-18: qty 4

## 2020-11-18 MED ORDER — FENTANYL CITRATE (PF) 100 MCG/2ML IJ SOLN
INTRAMUSCULAR | Status: DC | PRN
  Administered 2020-11-18: 50 ug via INTRAVENOUS

## 2020-11-18 MED ORDER — HYDRALAZINE HCL 20 MG/ML IJ SOLN
INTRAMUSCULAR | Status: AC
  Filled 2020-11-18: qty 1

## 2020-11-18 MED ORDER — FENTANYL CITRATE (PF) 250 MCG/5ML IJ SOLN
INTRAMUSCULAR | Status: AC
  Filled 2020-11-18: qty 5

## 2020-11-18 MED ORDER — FENTANYL CITRATE (PF) 100 MCG/2ML IJ SOLN
INTRAMUSCULAR | Status: DC | PRN
  Administered 2020-11-18 (×2): 50 ug via INTRAVENOUS

## 2020-11-18 MED ORDER — SODIUM CHLORIDE 0.9 % IV SOLN
INTRAVENOUS | Status: AC
  Filled 2020-11-18: qty 250

## 2020-11-18 MED ORDER — OXYCODONE HCL 5 MG PO TABS
ORAL_TABLET | ORAL | Status: AC
  Filled 2020-11-18: qty 1

## 2020-11-18 MED ORDER — EPHEDRINE SULFATE-NACL 50-0.9 MG/10ML-% IV SOSY
PREFILLED_SYRINGE | INTRAVENOUS | Status: DC | PRN
  Administered 2020-11-18: 10 mg via INTRAVENOUS
  Administered 2020-11-18: 5 mg via INTRAVENOUS

## 2020-11-18 MED ORDER — FENTANYL CITRATE (PF) 100 MCG/2ML IJ SOLN: 25.0000 ug | INTRAMUSCULAR | Status: DC | PRN

## 2020-11-18 MED ORDER — SODIUM CHLORIDE 0.9 % IV SOLN: INTRAVENOUS | Status: DC

## 2020-11-18 MED ORDER — ACETAMINOPHEN 500 MG PO TABS
1000.0000 mg | ORAL_TABLET | Freq: Once | ORAL | Status: AC
  Administered 2020-11-18: 1000 mg via ORAL
  Filled 2020-11-18: qty 2

## 2020-11-18 MED ORDER — MIDAZOLAM HCL 2 MG/2ML IJ SOLN
INTRAMUSCULAR | Status: DC | PRN
  Administered 2020-11-18: 0.5 mg via INTRAVENOUS

## 2020-11-18 MED ORDER — DEXAMETHASONE SODIUM PHOSPHATE 10 MG/ML IJ SOLN
INTRAMUSCULAR | Status: DC | PRN
  Administered 2020-11-18: 5 mg via INTRAVENOUS

## 2020-11-18 MED ORDER — DOCUSATE SODIUM 100 MG PO CAPS
100.0000 mg | ORAL_CAPSULE | Freq: Two times a day (BID) | ORAL | Status: DC
  Filled 2020-11-18 (×2): qty 1

## 2020-11-18 MED ORDER — LIDOCAINE HCL (PF) 1 % IJ SOLN
INTRAMUSCULAR | Status: DC | PRN
  Administered 2020-11-18: 5 mL via INTRADERMAL
  Administered 2020-11-18: 10 mL via INTRADERMAL

## 2020-11-18 MED ORDER — LACTATED RINGERS IV SOLN: INTRAVENOUS | Status: DC

## 2020-11-18 MED ORDER — OXYCODONE HCL 5 MG PO TABS
5.0000 mg | ORAL_TABLET | ORAL | Status: DC | PRN
  Administered 2020-11-18: 5 mg via ORAL

## 2020-11-18 MED ORDER — ALBUMIN HUMAN 5 % IV SOLN
INTRAVENOUS | Status: AC
  Filled 2020-11-18: qty 250

## 2020-11-18 MED ORDER — PHENYLEPHRINE HCL-NACL 10-0.9 MG/250ML-% IV SOLN
INTRAVENOUS | Status: DC | PRN
  Administered 2020-11-18: 40 ug/min via INTRAVENOUS

## 2020-11-18 MED ORDER — LIP MEDEX EX OINT
TOPICAL_OINTMENT | CUTANEOUS | Status: AC
  Filled 2020-11-18: qty 7

## 2020-11-18 MED ORDER — PROPOFOL 10 MG/ML IV BOLUS
INTRAVENOUS | Status: DC | PRN
  Administered 2020-11-18: 130 mg via INTRAVENOUS

## 2020-11-18 MED ORDER — LIDOCAINE-EPINEPHRINE 1 %-1:100000 IJ SOLN
INTRAMUSCULAR | Status: AC
  Filled 2020-11-18: qty 1

## 2020-11-18 MED ORDER — ONDANSETRON HCL 4 MG/2ML IJ SOLN
INTRAMUSCULAR | Status: DC | PRN
  Administered 2020-11-18: 4 mg via INTRAVENOUS

## 2020-11-18 MED ORDER — PHENYLEPHRINE HCL-NACL 10-0.9 MG/250ML-% IV SOLN
INTRAVENOUS | Status: AC
  Filled 2020-11-18: qty 500

## 2020-11-18 MED ORDER — PANTOPRAZOLE SODIUM 40 MG PO TBEC
40.0000 mg | DELAYED_RELEASE_TABLET | Freq: Two times a day (BID) | ORAL | Status: DC
  Administered 2020-11-18 – 2020-11-19 (×2): 40 mg via ORAL
  Filled 2020-11-18 (×2): qty 1

## 2020-11-18 MED ORDER — IOHEXOL 300 MG/ML  SOLN
100.0000 mL | Freq: Once | INTRAMUSCULAR | Status: AC | PRN
  Administered 2020-11-18: 30 mL via INTRA_ARTERIAL

## 2020-11-18 MED ORDER — HYDRALAZINE HCL 20 MG/ML IJ SOLN
INTRAMUSCULAR | Status: DC | PRN
  Administered 2020-11-18: 2 mg via INTRAVENOUS

## 2020-11-18 MED ORDER — GELATIN ABSORBABLE 12-7 MM EX MISC
CUTANEOUS | Status: DC | PRN
  Administered 2020-11-18: 1 via TOPICAL

## 2020-11-18 MED ORDER — OXYCODONE HCL 5 MG PO TABS: 10.0000 mg | ORAL_TABLET | ORAL | Status: DC | PRN

## 2020-11-18 MED ORDER — ACETAMINOPHEN 500 MG PO TABS
1000.0000 mg | ORAL_TABLET | Freq: Four times a day (QID) | ORAL | Status: DC
  Administered 2020-11-18 – 2020-11-19 (×3): 1000 mg via ORAL
  Filled 2020-11-18 (×3): qty 2

## 2020-11-18 MED ORDER — GELATIN ABSORBABLE 12-7 MM EX MISC
CUTANEOUS | Status: AC
  Administered 2020-11-18: 1 via INTRA_ARTERIAL
  Filled 2020-11-18: qty 1

## 2020-11-18 MED ORDER — HYDROCODONE-ACETAMINOPHEN 5-325 MG PO TABS: 1.0000 | ORAL_TABLET | ORAL | Status: DC | PRN

## 2020-11-18 MED ORDER — ROCURONIUM BROMIDE 10 MG/ML (PF) SYRINGE
PREFILLED_SYRINGE | INTRAVENOUS | Status: DC | PRN
  Administered 2020-11-18: 20 mg via INTRAVENOUS
  Administered 2020-11-18: 50 mg via INTRAVENOUS
  Administered 2020-11-18: 20 mg via INTRAVENOUS

## 2020-11-18 MED ORDER — GLYCOPYRROLATE PF 0.2 MG/ML IJ SOSY
PREFILLED_SYRINGE | INTRAMUSCULAR | Status: DC | PRN
  Administered 2020-11-18: .2 mg via INTRAVENOUS

## 2020-11-18 MED ORDER — LIDOCAINE 2% (20 MG/ML) 5 ML SYRINGE
INTRAMUSCULAR | Status: DC | PRN
  Administered 2020-11-18: 80 mg via INTRAVENOUS

## 2020-11-18 MED ORDER — METOPROLOL SUCCINATE ER 50 MG PO TB24
100.0000 mg | ORAL_TABLET | Freq: Every day | ORAL | Status: DC
  Administered 2020-11-19: 100 mg via ORAL
  Filled 2020-11-18: qty 2

## 2020-11-18 MED ORDER — IOHEXOL 300 MG/ML  SOLN
100.0000 mL | Freq: Once | INTRAMUSCULAR | Status: DC | PRN
  Administered 2020-11-18: 150 mL via INTRAVENOUS

## 2020-11-18 MED ORDER — PROPOFOL 1000 MG/100ML IV EMUL
INTRAVENOUS | Status: AC
  Filled 2020-11-18: qty 200

## 2020-11-18 MED ORDER — POLYETHYL GLYCOL-PROPYL GLYCOL 0.4-0.3 % OP SOLN: 1.0000 [drp] | Freq: Two times a day (BID) | OPHTHALMIC | Status: DC

## 2020-11-18 MED ORDER — PHENYLEPHRINE 40 MCG/ML (10ML) SYRINGE FOR IV PUSH (FOR BLOOD PRESSURE SUPPORT)
PREFILLED_SYRINGE | INTRAVENOUS | Status: DC | PRN
  Administered 2020-11-18: 120 ug via INTRAVENOUS
  Administered 2020-11-18: 80 ug via INTRAVENOUS

## 2020-11-18 MED ORDER — ONDANSETRON HCL 4 MG/2ML IJ SOLN: 4.0000 mg | Freq: Once | INTRAMUSCULAR | Status: DC | PRN

## 2020-11-18 NOTE — Progress Notes (Addendum)
Patient ID: Kimberly Gay, female   DOB: 07/17/1941, 80 y.o.   MRN: [id number] Patient status post right renal mass biopsy and cryoablation earlier today followed by selective right renal artery Gelfoam embolization due to enlarging hematoma.  Currently with only mild right flank discomfort.  Denies fever, headache, chest pain, dyspnea, nausea, vomiting or bleeding.  Vital signs stable, afebrile, yellow urine in Foley catheter.  Right groin/CFA access site soft, clean, dry, nontender, no significant hematoma.  Plan to observe patient overnight, obtain serial CBCs and follow-up with renal function in a.m. Keep Foley in place until a.m.  Encourage hydration.

## 2020-11-18 NOTE — Anesthesia Procedure Notes (Signed)
Procedure Name: Intubation Date/Time: 11/18/2020 9:01 AM Performed by: West Pugh, CRNA Pre-anesthesia Checklist: Patient identified, Emergency Drugs available, Suction available, Patient being monitored and Timeout performed Patient Re-evaluated:Patient Re-evaluated prior to induction Oxygen Delivery Method: Circle system utilized Preoxygenation: Pre-oxygenation with 100% oxygen Induction Type: IV induction Ventilation: Mask ventilation without difficulty Laryngoscope Size: Mac and 3 Grade View: Grade I Tube type: Oral Tube size: 7.0 mm Number of attempts: 1 Airway Equipment and Method: Stylet Placement Confirmation: ETT inserted through vocal cords under direct vision,  positive ETCO2,  CO2 detector and breath sounds checked- equal and bilateral Secured at: 21 cm Tube secured with: Tape Dental Injury: Teeth and Oropharynx as per pre-operative assessment

## 2020-11-18 NOTE — Procedures (Signed)
Interventional Radiology Procedure Note  Procedure: CT guided cryoablation of right renal mass  Indication: Right renal mass  Findings: Please refer to procedural dictation for full description.  Complications: Rapidly expanding right peri nephric hematoma requiring angiogram and embolization.  EBL: < 200 mL  Miachel Roux, MD [phone]

## 2020-11-18 NOTE — H&P (Addendum)
Referring Physician(s): Pace,M  Supervising Physician: Mir,F  Patient Status:  [facility] OP TBA  Chief Complaint:  Right renal mass  Subjective: Patient familiar to IR service from tele consultation with Dr. 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  She is a 80 year old female with remote history of colon carcinoma and prior resection who presented with right upper abdominal pain in January of this year and subsequent imaging revealing liver cyst and small bilateral renal cysts as well as a 1.5 cm enhancing right lower pole renal mass.  This was new since the most recent CT in 2005 and findings were consistent with renal neoplasm.  She has had prior episodes of hematuria which resolved with treatment of urinary tract infection.  Following discussions with Dr. Vernard Gambles she was deemed an appropriate candidate for CT-guided biopsy and cryoablation of the right renal mass and presents today for the procedure.  She currently denies fever, headache, chest pain, dyspnea, cough, back pain, nausea, vomiting or bleeding.  No medical history as below.  Past Medical History:  Diagnosis Date  . 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 Brain aneurysm 04/03/2019  . Cancer ([place]) 1994   colon-resection  . Cerebral aneurysm 04/03/2019  . Cerebral aneurysm, nonruptured 2010   not treated due to location-monitor yearly  . Dyspnea on exertion 04/03/2019  . Dysrhythmia    afib   . GERD (gastroesophageal reflux disease)    infrequent  . Guaiac + stool 02/26/2020  . Hypertension    well controlled  . Incontinence of urine   . Joint pain, knee   . Palpitations 04/03/2019  . Paroxysmal atrial fibrillation ([facility]) CHA2DS2-VASc equals 4, not anticoagulated so far because of history of intracranial bleed and aneurysm 10/28/2019  . Precordial chest pain 04/03/2019  . Stroke [facility])    due to aneurysm rupture in 2010   . Urgency of urination    Past  Surgical History:  Procedure Laterality Date  . ABDOMINAL HYSTERECTOMY    . ANEURYSM COILING  2010   cerebral coiling-no deficits  . BREAST EXCISIONAL BIOPSY Left 04/05/1994  . CHOLECYSTECTOMY    . CYSTOSCOPY  09/03/2012   Procedure: CYSTOSCOPY;  Surgeon: Ailene Rud, MD;  Location: [facility];  Service: Urology;  Laterality: N/A;  . FLEXIBLE SIGMOIDOSCOPY N/A 11/24/2014   Procedure: FLEXIBLE SIGMOIDOSCOPY;  Surgeon: Garlan Fair, MD;  Location: [facility];  Service: Endoscopy;  Laterality: N/A;  . IR RADIOLOGIST EVAL & MGMT  09/23/2020  . LAMINECTOMY    . LYSIS OF ADHESION  2000's   with cholecystectomy procedure  . PILONIDAL CYST EXCISION    . PUBOVAGINAL SLING  09/03/2012   Procedure: Gaynelle Arabian;  Surgeon: Ailene Rud, MD;  Location[facility];  Service: Urology;  Laterality: N/A;  LYNX SLING     Allergies: Amoxicillin, Meperidine and related, and Wasp venom  Medications: Prior to Admission medications   Medication Sig Start Date End Date Taking? Authorizing Provider  acetaminophen (TYLENOL) 500 MG tablet Take 500-1,000 mg by mouth every 6 (six) hours as needed for mild pain.   Yes [provider]  aspirin EC 81 MG tablet Take 1 tablet (81 mg total) by mouth daily. Swallow whole. 02/14/20  Yes Park Liter, MD  cetirizine (ZYRTEC) 10 MG tablet Take 10 mg by mouth daily as needed for allergies.   Yes [provider]  cholecalciferol (VITAMIN D) 1000 UNITS tablet Take  2,000 Units by mouth daily.    Yes [provider]  clindamycin (CLEOCIN) 300 MG capsule Take 600 mg by mouth See admin instructions. Take 600 mg by mouth prior to dental appointments 03/25/20  Yes [provider]  EPINEPHrine (EPIPEN) 0.3 mg/0.3 mL SOAJ injection Inject 0.3 mLs (0.3 mg total) into the muscle once. 08/09/13  Yes Linton Flemings, MD  furosemide (LASIX) 20 MG tablet Take 20 mg by mouth daily.  02/02/19  Yes  [provider]  hydrocortisone 2.5 % cream Apply 2.5 application topically daily as needed (hemorrhoids). 12/23/19  Yes [provider]  metoprolol succinate (TOPROL-XL) 100 MG 24 hr tablet TAKE 1 TABLET BY MOUTH DAILY. TAKE WITH OR IMMEDIATELY FOLLOWING A MEAL. Patient taking differently: Take 100 mg by mouth daily. 08/10/20  Yes Park Liter, MD  nitroGLYCERIN (NITROSTAT) 0.4 MG SL tablet Place 1 tablet (0.4 mg total) under the tongue every 5 (five) minutes as needed for chest pain. 04/03/19  Yes Park Liter, MD  pantoprazole (PROTONIX) 40 MG tablet Take 40 mg by mouth 2 (two) times daily.   Yes [provider]  phenylephrine-shark liver oil-mineral oil-petrolatum (PREPARATION H) 0.25-3-14-71.9 % rectal ointment Place 1 application rectally daily.   Yes [provider]  Polyethyl Glycol-Propyl Glycol 0.4-0.3 % SOLN Place 1 drop into both eyes in the morning and at bedtime.   Yes [provider]     Vital Signs: BP (!) 163/75   Pulse 69   Temp 98.5 F (36.9 C) (Oral)   Resp 16   Ht 5\' 5"  (1.651 m)   Wt 174 lb (78.9 kg)   SpO2 95%   BMI 28.96 kg/m   Physical Exam awake, alert.  Chest clear to auscultation bilaterally.  Heart with regular rate and rhythm.  Abdomen soft, positive bowel sounds, some minimal right upper quadrant tenderness to palpation.  No lower extremity edema.  Imaging: No results found.  Labs:  CBC: Recent Labs    06/29/20 0957 11/11/20 1120  WBC 8.2 7.6  HGB 14.9 13.8  HCT 44.5 43.8  PLT 243 233    COAGS: Recent Labs    11/11/20 1120  INR 1.1    BMP: Recent Labs    11/11/20 1120  NA 139  K 4.0  CL 106  CO2 25  GLUCOSE 90  BUN 23  CALCIUM 9.9  CREATININE 0.89  GFRNONAA >60    LIVER FUNCTION TESTS: No results for input(s): BILITOT, AST, ALT, ALKPHOS, PROT, ALBUMIN in the last 8760 hours.  Assessment and Plan: Patient familiar to IR service from tele consultation with Dr. 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  She is a 80 year old female with past medical history of stroke with cerebral aneurysm in 2010, paroxysmal atrial fibrillation, hypertension, GERD, and remote history of colon carcinoma and prior resection who presented with right upper abdominal pain in January of this year and subsequent imaging revealing liver cyst and small bilateral renal cysts as well as a 1.5 cm enhancing right lower pole renal mass.  This was new since the most recent CT in 2005 and findings were consistent with renal neoplasm.  She has had prior episodes of hematuria which resolved with treatment of urinary tract infection.  Following discussions with Dr. Vernard Gambles she was deemed an appropriate candidate for CT-guided biopsy and cryoablation of the right renal mass and presents today for the procedure. Risks and benefits of procedure was  discussed with the patient  including, but not limited to bleeding, infection, damage to adjacent structures or low yield requiring additional tests.  All of the questions were answered and there is agreement to proceed.  Consent signed and in chart.  Post procedure patient will be admitted for overnight observation.   Electronically Signed: D. Rowe Robert, PA-C 11/18/2020, 8:15 AM   I spent a total of 30 minutes at the the patient's bedside AND on the patient's hospital floor or unit, greater than 50% of which was counseling/coordinating care for CT-guided cryoablation and biopsy of right renal mass

## 2020-11-18 NOTE — Transfer of Care (Signed)
Immediate Anesthesia Transfer of Care Note  Patient: Kimberly Gay  Procedure(s) Performed: IR WITH ANESTHESIA RENAL CRYOABLATION (Right )  Patient Location: Radiology  Anesthesia Type:General  Level of Consciousness: awake, alert , oriented and patient cooperative  Airway & Oxygen Therapy: Patient Spontanous Breathing and Patient connected to face mask oxygen  Post-op Assessment: Report given to RN and Post -op Vital signs reviewed and stable  Post vital signs: Reviewed and stable  Last Vitals:  Vitals Value Taken Time  BP 146/63 11/18/20 1217  Temp    Pulse 64 11/18/20 1216  Resp 11 11/18/20 1216  SpO2 99 % 11/18/20 1216    Last Pain:  Vitals:   11/18/20 0709  TempSrc: Oral  PainSc:          Complications: No complications documented.

## 2020-11-18 NOTE — Anesthesia Postprocedure Evaluation (Signed)
Anesthesia Post Note  Patient: Kimberly Gay  Procedure(s) Performed: IR WITH ANESTHESIA RENAL CRYOABLATION (Right )     Patient location during evaluation: PACU Anesthesia Type: General Level of consciousness: awake and alert Pain management: pain level controlled Vital Signs Assessment: post-procedure vital signs reviewed and stable Respiratory status: spontaneous breathing, nonlabored ventilation, respiratory function stable and patient connected to nasal cannula oxygen Cardiovascular status: blood pressure returned to baseline and stable Postop Assessment: no apparent nausea or vomiting Anesthetic complications: no   No complications documented.  Last Vitals:  Vitals:   11/18/20 1445 11/18/20 1611  BP: (!) 153/57 (!) 150/69  Pulse: (!) 53 (!) 51  Resp: 12 17  Temp:    SpO2: 99% 94%    Last Pain:  Vitals:   11/18/20 1530  TempSrc:   PainSc: 3                  Catalina Gravel

## 2020-11-18 NOTE — Progress Notes (Signed)
Was notified of post-cryoablation expanding hematoma about the inferior pole of right kidney by partner, Dr. Dwaine Gale.  Agree with findings on CT, and that angiogram should be pursued for hemorrhagic control.  The patient was extubated and this plan was discussed, she is in agreement to proceed.  She is currently hemodynamically stable.  Dr. Alinda Money with Urology notified as well.  Plan for right renal angiogram with possible embolization with moderate sedation.  Ruthann Cancer, MD Pager: [phone]

## 2020-11-18 NOTE — Plan of Care (Signed)

## 2020-11-18 NOTE — Procedures (Signed)
Interventional Radiology Procedure Note  Procedure:  1) Abdominal aortogram 2) Right renal angiogram 3) Selective right inferior segmental renal angiogram 4) Right inferior segmental renal artery gelfoam embolization   Findings: Please refer to procedural dictation for full description.  Abnormal hyperemia and pruning of vessels in inferior pole of right kidney without pseudoaneurysm or active extravasation.  Gelfoam slurry embolization of inferior segmental artery branches.  6 Fr Angioseal right CFA closure.  Complications: None immeidate  Estimated Blood Loss: <5 mL  Recommendations: Strict 4 hour bedrest with head of bed flat for 2 hours (until 15:15) and up to 30 degrees for 2 hours (until 17:15). Keep Foley in place for now. CBC, BMP now for baseline. Repeat CBC Q8H. Admit to IR service overnight.   Ruthann Cancer, MD Pager: [phone]

## 2020-11-19 ENCOUNTER — Encounter (HOSPITAL_COMMUNITY): Payer: Self-pay | Admitting: Interventional Radiology

## 2020-11-19 DIAGNOSIS — Z85038 Personal history of other malignant neoplasm of large intestine: Secondary | ICD-10-CM | POA: Diagnosis not present

## 2020-11-19 DIAGNOSIS — I480 Paroxysmal atrial fibrillation: Secondary | ICD-10-CM | POA: Diagnosis not present

## 2020-11-19 DIAGNOSIS — Z888 Allergy status to other drugs, medicaments and biological substances status: Secondary | ICD-10-CM | POA: Diagnosis not present

## 2020-11-19 DIAGNOSIS — Z7982 Long term (current) use of aspirin: Secondary | ICD-10-CM | POA: Diagnosis not present

## 2020-11-19 DIAGNOSIS — Z8673 Personal history of transient ischemic attack (TIA), and cerebral infarction without residual deficits: Secondary | ICD-10-CM | POA: Diagnosis not present

## 2020-11-19 DIAGNOSIS — I10 Essential (primary) hypertension: Secondary | ICD-10-CM | POA: Diagnosis not present

## 2020-11-19 DIAGNOSIS — Z79899 Other long term (current) drug therapy: Secondary | ICD-10-CM | POA: Diagnosis not present

## 2020-11-19 DIAGNOSIS — N2889 Other specified disorders of kidney and ureter: Secondary | ICD-10-CM | POA: Diagnosis not present

## 2020-11-19 DIAGNOSIS — Z880 Allergy status to penicillin: Secondary | ICD-10-CM | POA: Diagnosis not present

## 2020-11-19 LAB — BASIC METABOLIC PANEL
Anion gap: 7 (ref 5–15)
BUN: 17 mg/dL (ref 8–23)
CO2: 23 mmol/L (ref 22–32)
Calcium: 9.5 mg/dL (ref 8.9–10.3)
Chloride: 107 mmol/L (ref 98–111)
Creatinine, Ser: 1 mg/dL (ref 0.44–1.00)
GFR, Estimated: 57 mL/min — ABNORMAL LOW (ref >60)
Glucose, Bld: 132 mg/dL — ABNORMAL HIGH (ref 70–99)
Potassium: 4 mmol/L (ref 3.5–5.1)
Sodium: 137 mmol/L (ref 135–145)

## 2020-11-19 LAB — CBC WITH DIFFERENTIAL/PLATELET
Abs Immature Granulocytes: 0.08 10*3/uL — ABNORMAL HIGH (ref 0.00–0.07)
Basophils Absolute: 0 10*3/uL (ref 0.0–0.1)
Basophils Relative: 0 %
Eosinophils Absolute: 0 10*3/uL (ref 0.0–0.5)
Eosinophils Relative: 0 %
HCT: 40.2 % (ref 36.0–46.0)
Hemoglobin: 12.2 g/dL (ref 12.0–15.0)
Immature Granulocytes: 1 %
Lymphocytes Relative: 10 %
Lymphs Abs: 1.2 10*3/uL (ref 0.7–4.0)
MCH: 26.9 pg (ref 26.0–34.0)
MCHC: 30.3 g/dL (ref 30.0–36.0)
MCV: 88.5 fL (ref 80.0–100.0)
Monocytes Absolute: 0.6 10*3/uL (ref 0.1–1.0)
Monocytes Relative: 5 %
Neutro Abs: 9.8 10*3/uL — ABNORMAL HIGH (ref 1.7–7.7)
Neutrophils Relative %: 84 %
Platelets: 202 10*3/uL (ref 150–400)
RBC: 4.54 MIL/uL (ref 3.87–5.11)
RDW: 14.5 % (ref 11.5–15.5)
WBC: 11.8 10*3/uL — ABNORMAL HIGH (ref 4.0–10.5)
nRBC: 0 % (ref 0.0–0.2)

## 2020-11-19 MED ORDER — CHLORHEXIDINE GLUCONATE CLOTH 2 % EX PADS
6.0000 | MEDICATED_PAD | Freq: Every day | CUTANEOUS | Status: DC
  Administered 2020-11-19: 6 via TOPICAL

## 2020-11-19 NOTE — Discharge Summary (Signed)
Patient ID: Kimberly Gay MRN: [id number] DOB/AGE: [birth year] 80 y.o.  Admit date: 11/18/2020 Discharge date: 11/19/2020  Supervising Physician: Markus Daft  Patient Status: [facility] - In-pt  Admission Diagnoses: Right renal mass   Discharge Diagnoses:  Active Problems:   Renal mass, right   Discharged Condition: discharge to home with self-care   Hospital Course:   Patient presented to [facility] IR for an image-guided right renal mass cryoablation with Dr. Dwaine Gale on 11/18/20. The procedure was complicated by immediate postprocedural expanding perinephric hemorrhage. Patient underwent abdominal aortogram and gel-foam embolization of right inferior segmental renal artery. Patient was transferred to floor in stable condition (VSS, right flank and right groin puncture sites stable) for overnight observation. No major events occurred overnight.   Patient awake and alert  no complaints at this time. Foley catheter removed this AM without difficulty. Patient denies right flank pain, right groin pain, or hematuria. Right flank and right groin puncture sites stable. Plan to discharge home today and follow-up with Dr. Dwaine Gale for televisit 3-4 weeks after discharge.   Consults: None  Significant Diagnostic Studies: none   Treatments: Cryoabalation of the right renal mass   Discharge Exam: Blood pressure (!) 132/56, pulse 67, temperature 97.7 F (36.5 C), temperature source Oral, resp. rate 14, height 5\' 5"  (1.651 m), weight 174 lb (78.9 kg), SpO2 94 %.  Physical Exam  Vitals and nursing note reviewed.  Constitutional:      General: He is not in acute distress.    Appearance: Normal appearance.  HENT:     Head: Normocephalic and atraumatic.     Mouth/Throat:     Mouth: Mucous membranes are moist.     Pharynx: Oropharynx is clear.  Cardiovascular:     Rate and Rhythm: Normal rate and regular rhythm.     Pulses: Normal pulses. Right dorsalis pedis 2+     Heart sounds: Normal heart sounds.   Pulmonary:     Effort: Pulmonary effort is normal.     Breath sounds: Normal breath sounds. No wheezing, rhonchi or rales.  Abdominal:     General: Bowel sounds are normal. There is no distension.     Palpations: Abdomen is soft.  Skin:    General: Skin is warm and dry.    Positive dressing to right flank and right groin puncture sites. The puncture sites and dressings are clean, dry, no sign of acute infection. No bleeding, no tenderness to palpation.  Neurological:     Mental Status: He is alert and oriented to person, place, and time.  Psychiatric:        Mood and Affect: Mood normal.        Behavior: Behavior normal.    Disposition: Discharge disposition: 01-Home or Self Care       Discharge Instructions    Call MD for:   Complete by: As directed    Call MD for:  difficulty breathing, headache or visual disturbances   Complete by: As directed    Call MD for:  extreme fatigue   Complete by: As directed    Call MD for:  hives   Complete by: As directed    Call MD for:  persistant dizziness or light-headedness   Complete by: As directed    Call MD for:  persistant nausea and vomiting   Complete by: As directed    Call MD for:  redness, tenderness, or signs of infection (pain, swelling, redness, odor or green/yellow discharge around incision site)   Complete  by: As directed    Call MD for:  severe uncontrolled pain   Complete by: As directed    Call MD for:  temperature >100.4   Complete by: As directed    Diet - low sodium heart healthy   Complete by: As directed    Discharge instructions   Complete by: As directed    Televisit with Dr. Dwaine Gale in 4 weeks. Our office will call you to set up the appointment. Keep the puncture sites on right flank and groin dry and clean until fully closed. Ok to shower. No further dressing change needed. No submerging (bathing, swimming) for 7 days. No lifting, bending, stooping more than 10 pounds for 7 days.   Discharge wound care:    Complete by: As directed    No further dressing changed needed. Keep the puncture sites clean and dry until fully closed.   Increase activity slowly   Complete by: As directed      Allergies as of 11/19/2020      Reactions   Amoxicillin Swelling   Meperidine And Related Other (See Comments)   UNKNOWN   Wasp Venom Swelling      Medication List    TAKE these medications   acetaminophen 500 MG tablet Commonly known as: TYLENOL Take 500-1,000 mg by mouth every 6 (six) hours as needed for mild pain.   aspirin EC 81 MG tablet Take 1 tablet (81 mg total) by mouth daily. Swallow whole.   cetirizine 10 MG tablet Commonly known as: ZYRTEC Take 10 mg by mouth daily as needed for allergies.   cholecalciferol 1000 units tablet Commonly known as: VITAMIN D Take 2,000 Units by mouth daily.   clindamycin 300 MG capsule Commonly known as: CLEOCIN Take 600 mg by mouth See admin instructions. Take 600 mg by mouth prior to dental appointments   EPINEPHrine 0.3 mg/0.3 mL Soaj injection Commonly known as: EpiPen Inject 0.3 mLs (0.3 mg total) into the muscle once.   furosemide 20 MG tablet Commonly known as: LASIX Take 20 mg by mouth daily.   hydrocortisone 2.5 % cream Apply 2.5 application topically daily as needed (hemorrhoids).   metoprolol succinate 100 MG 24 hr tablet Commonly known as: TOPROL-XL TAKE 1 TABLET BY MOUTH DAILY. TAKE WITH OR IMMEDIATELY FOLLOWING A MEAL. What changed: additional instructions   nitroGLYCERIN 0.4 MG SL tablet Commonly known as: NITROSTAT Place 1 tablet (0.4 mg total) under the tongue every 5 (five) minutes as needed for chest pain.   pantoprazole 40 MG tablet Commonly known as: PROTONIX Take 40 mg by mouth 2 (two) times daily.   phenylephrine-shark liver oil-mineral oil-petrolatum 0.25-3-14-71.9 % rectal ointment Commonly known as: PREPARATION H Place 1 application rectally daily.   Polyethyl Glycol-Propyl Glycol 0.4-0.3 % Soln Place 1 drop  into both eyes in the morning and at bedtime.            Discharge Care Instructions  (From admission, onward)         Start     Ordered   11/19/20 0000  Discharge wound care:       Comments: No further dressing changed needed. Keep the puncture sites clean and dry until fully closed.   11/19/20 1016          Follow-up Information    Mir, Paula Libra, MD Follow up.   Specialties: Interventional Radiology, Diagnostic Radiology, Radiology Why: Televisit in 4 weeks with Dr. Dwaine Gale. Our office will call you to set up the appointment.  Contact information: [place] [address] [phone]                Electronically Signed: Tera Mater, PA-C 11/19/2020, 10:18 AM   I have spent Less Than 30 Minutes discharging Lucious Groves.

## 2020-11-19 NOTE — Discharge Instructions (Signed)
Cryoablation of Renal Tumors, Care After  This sheet gives you information about how to care for yourself after your procedure. Your health care provider may also give you more specific instructions. If you have problems or questions, contact your health care provider.  What can I expect after the procedure? After your procedure, it is common to have pain and discomfort in the upper abdomen. You may be given prescription pain medicines to control this if the pain is severe.   Follow these instructions at home: Puncture site care Follow instructions from your health care provider about how to take care of your incision. Make sure you:  - Wash your hands with soap and water before you change your bandage (dressing). If soap and water are not available, use hand sanitizer.  - Ok to shower 48 hours following procedure. Recommend showering with bandage on, remove bandage immediately after showering and pat area dry. No further dressing changes needed after this- ensure area remains clean and dry until fully healed.  - No submerging (swimming, bathing) for 7 days post-procedure. Check your puncture site area every day for signs of infection. Check for: - Redness, swelling, or pain. - Fluid or blood. - Warmth. - Pus or a bad smell. Activity Rest as often as needing during the first few days of recovery. No stooping, bending, or lifting more than 10 pounds for 1 week.  General instructions To prevent or treat constipation while you are taking prescription pain medicine, your health care provider may recommend that you: - Drink enough fluid to keep your urine clear or pale yellow. - Take over-the-counter or prescription medicines. - Eat foods that are high in fiber, such as fresh fruits and vegetables, whole grains, and beans. - Limit foods that are high in fat and processed sugars, such as fried and sweet foods. - Do not use any products that contain nicotine or tobacco, such as cigarettes and  e-cigarettes. If you need help quitting, ask your health care provider. - Keep all follow-up visits as told by your health care provider. This is important.  3-4 week televisit with the doctor who performed the procedure. Our office will call you to set up the appointment.   Contact a health care provider if: You cannot pass gas. You are unable to have a bowel movement within 3 days. You have a skin rash.  Get help right away if: You have a fever. You have severe or lasting pain in your abdomen, shoulder, or back. You have trouble swallowing or breathing. You have severe weakness or dizziness. You have chest pain or shortness of breath.   This information is not intended to replace advice given to you by your health care provider. Make sure you discuss any questions you have with your health care provider. 

## 2020-11-23 LAB — SURGICAL PATHOLOGY

## 2020-12-15 ENCOUNTER — Ambulatory Visit
Admission: RE | Admit: 2020-12-15 | Discharge: 2020-12-15 | Disposition: A | Discharge status: Home / Self Care | Payer: Medicare PPO | Source: Ambulatory Visit | Attending: Student | Admitting: Student

## 2020-12-15 ENCOUNTER — Other Ambulatory Visit: Payer: Self-pay

## 2020-12-15 ENCOUNTER — Encounter: Payer: Self-pay | Admitting: *Deleted

## 2020-12-15 DIAGNOSIS — N2889 Other specified disorders of kidney and ureter: Secondary | ICD-10-CM | POA: Diagnosis not present

## 2020-12-15 HISTORY — PX: IR RADIOLOGIST EVAL & MGMT: IMG5224

## 2020-12-15 NOTE — Progress Notes (Signed)
Chief Complaint: Patient was consulted remotely today (TeleHealth) for No chief complaint on file.  at the request of Han,Kimberly Gay.    Referring Physician(s): Han,Kimberly Gay  History of Present Illness: Kimberly Gay is a 80 y.o. female with remote history of colon cancer s/p resection, presented with RUQ pain in January, 2022.  MRI of abdomen subsequently showed a solid right renal mass. She underwent cryoablation of this mass on 11/18/2020.  The procedure was complicated by rapidly enlarging hematoma which required angiogram and embolization.  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  She feels well today.  She denies hematuria, flank pain, abdominal pain, chest pain, or shortness of breath.    Past Medical History:  Diagnosis Date  . 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 Brain aneurysm 04/03/2019  . Cancer ([place]) 1994   colon-resection  . Cerebral aneurysm 04/03/2019  . Cerebral aneurysm, nonruptured 2010   not treated due to location-monitor yearly  . Dyspnea on exertion 04/03/2019  . Dysrhythmia    afib   . GERD (gastroesophageal reflux disease)    infrequent  . Guaiac + stool 02/26/2020  . Hypertension    well controlled  . Incontinence of urine   . Joint pain, knee   . Palpitations 04/03/2019  . Paroxysmal atrial fibrillation (HCC) CHA2DS2-VASc equals 4, not anticoagulated so far because of history of intracranial bleed and aneurysm 10/28/2019  . Precordial chest pain 04/03/2019  . Stroke [facility])    due to aneurysm rupture in 2010   . Urgency of urination     Past Surgical History:  Procedure Laterality Date  . ABDOMINAL HYSTERECTOMY    . ANEURYSM COILING  2010   cerebral coiling-no deficits  . BREAST EXCISIONAL BIOPSY Left 04/05/1994  . CHOLECYSTECTOMY    . CYSTOSCOPY  09/03/2012   Procedure: CYSTOSCOPY;  Surgeon: Ailene Rud, MD;  Location: [facility];  Service: Urology;   Laterality: N/A;  . FLEXIBLE SIGMOIDOSCOPY N/A 11/24/2014   Procedure: FLEXIBLE SIGMOIDOSCOPY;  Surgeon: Garlan Fair, MD;  Location: [facility];  Service: Endoscopy;  Laterality: N/A;  . IR EMBO ART  VEN HEMORR LYMPH EXTRAV  INC GUIDE ROADMAPPING  11/18/2020  . IR RADIOLOGIST EVAL & MGMT  09/23/2020  . IR RENAL SUPRASEL UNI S&I MOD SED  11/18/2020  . IR US GUIDE VASC ACCESS RIGHT  11/18/2020  . LAMINECTOMY    . LYSIS OF ADHESION  2000's   with cholecystectomy procedure  . PILONIDAL CYST EXCISION    . PUBOVAGINAL SLING  09/03/2012   Procedure: Gaynelle Arabian;  Surgeon: Ailene Rud, MD;  Location: [facility];  Service: Urology;  Laterality: N/A;  LYNX SLING   . RADIOLOGY WITH ANESTHESIA Right 11/18/2020   Procedure: IR WITH ANESTHESIA RENAL CRYOABLATION;  Surgeon: Arne Cleveland, MD;  Location: [facility];  Service: Radiology;  Laterality: Right;    Allergies: Amoxicillin, Meperidine and related, and Wasp venom  Medications: Prior to Admission medications   Medication Sig Start Date End Date Taking? Authorizing Provider  acetaminophen (TYLENOL) 500 MG tablet Take 500-1,000 mg by mouth every 6 (six) hours as needed for mild pain.    [provider]  aspirin EC 81 MG tablet Take 1 tablet (81 mg total) by mouth daily. Swallow whole. 02/14/20   Park Liter, MD  cetirizine (ZYRTEC) 10 MG tablet Take 10 mg by mouth daily as  needed for allergies.    [provider]  cholecalciferol (VITAMIN D) 1000 UNITS tablet Take 2,000 Units by mouth daily.     [provider]  clindamycin (CLEOCIN) 300 MG capsule Take 600 mg by mouth See admin instructions. Take 600 mg by mouth prior to dental appointments 03/25/20   [provider]  EPINEPHrine (EPIPEN) 0.3 mg/0.3 mL SOAJ injection Inject 0.3 mLs (0.3 mg total) into the muscle once. 08/09/13   Linton Flemings, MD  furosemide (LASIX) 20 MG tablet Take 20 mg by mouth daily.  02/02/19   [provider]  hydrocortisone 2.5 % cream Apply 2.5 application topically daily as needed (hemorrhoids). 12/23/19   [provider]  metoprolol succinate (TOPROL-XL) 100 MG 24 hr tablet TAKE 1 TABLET BY MOUTH DAILY. TAKE WITH OR IMMEDIATELY FOLLOWING A MEAL. Patient taking differently: Take 100 mg by mouth daily. 08/10/20   Park Liter, MD  nitroGLYCERIN (NITROSTAT) 0.4 MG SL tablet Place 1 tablet (0.4 mg total) under the tongue every 5 (five) minutes as needed for chest pain. 04/03/19   Park Liter, MD  pantoprazole (PROTONIX) 40 MG tablet Take 40 mg by mouth 2 (two) times daily.    [provider]  phenylephrine-shark liver oil-mineral oil-petrolatum (PREPARATION Gay) 0.25-3-14-71.9 % rectal ointment Place 1 application rectally daily.    [provider]  Polyethyl Glycol-Propyl Glycol 0.4-0.3 % SOLN Place 1 drop into both eyes in the morning and at bedtime.    [provider]     Family History  Problem Relation Age of Onset  . Diabetes Other   . Breast cancer Cousin   . Seizures Mother   . Heart attack Father     Social History   Socioeconomic History  . Marital status: Widowed    Spouse name: Not on file  . Number of children: Not on file  . Years of education: Not on file  . Highest education level: Not on file  Occupational History  . Not on file  Tobacco Use  . Smoking status: Never Smoker  . Smokeless tobacco: Never Used  Vaping Use  . Vaping Use: Never used  Substance and Sexual Activity  . Alcohol use: No  . Drug use: No  . Sexual activity: Not on file  Other Topics Concern  . Not on file  Social History Narrative  . Not on file   Social Determinants of Health   Financial Resource Strain: Not on file  Food Insecurity: Not on file  Transportation Needs: Not on file  Physical Activity: Not on file  Stress: Not on file  Social Connections: Not on file   Review of Systems  Review of Systems: A 12 point ROS  discussed and pertinent positives are indicated in the HPI above.  All other systems are negative.  Physical Exam No direct physical exam was performed  Vital Signs: There were no vitals taken for this visit.  Imaging: IR Angiogram Renal Uni Selective  Result Date: 11/18/2020 INDICATION: 80 year old female with history of right renal mass status post cryoablation complicated by immediate postprocedural expanding perinephric hemorrhage. EXAM: 1. Ultrasound-guided vascular access the right common femoral artery 2. Catheterization of the abdominal aorta 3. Abdominal aortogram 4. Selective catheterization and angiogram of the right renal artery 5. Sub selective catheterization and angiogram of right inferior segmental renal artery 6. Gel-Foam embolization of right inferior segmental renal artery MEDICATIONS: None. ANESTHESIA/SEDATION: Moderate (conscious) sedation was employed during this procedure. A total of Versed 0.5  mg and Fentanyl 50 mcg was administered intravenously. Moderate Sedation Time: 52 minutes. The patient's level of consciousness and vital signs were monitored continuously by radiology nursing throughout the procedure under my direct supervision. CONTRAST:  101mL OMNIPAQUE IOHEXOL 300 MG/ML SOLN, 68mL OMNIPAQUE IOHEXOL 300 MG/ML SOLN FLUOROSCOPY TIME:  Fluoroscopy Time: 6 minutes 54 seconds (654 mGy). COMPLICATIONS: None immediate. PROCEDURE: Informed consent was obtained from the patient following explanation of the procedure, risks, benefits and alternatives. The patient understands, agrees and consents for the procedure. All questions were addressed. A time out was performed prior to the initiation of the procedure. Maximal barrier sterile technique utilized including caps, mask, sterile gowns, sterile gloves, large sterile drape, hand hygiene, and Betadine prep. The right groin was prepped and draped in standard fashion. Preprocedure ultrasound evaluation demonstrated patent right common  femoral artery without significant atherosclerotic burden. The procedure was planned. Subdermal Local anesthesia was provided with 1% lidocaine. A small skin nick was made. Under direct ultrasound visualization, a 21 gauge micropuncture needle was directed into the common femoral artery. A micropuncture set was inserted and a limited right lower extremity angiogram was performed which demonstrated adequate puncture site for use of a closure device. A J wire was directed to the suprarenal abdominal aorta. A 5 French, 11 cm vascular sheath was placed and connected to a pressurized heparinized saline flush. A pigtail catheter was then inserted over the wire and the wire removed. Abdominal angiogram was performed. Angiogram demonstrates patent celiac trunk, dual right and single left renal arteries, SMA, and aortic bifurcation. The aorta is tortuous but normal in caliber. The inferior right renal artery was then selected with a C2 catheter. Right renal angiogram was then performed. No pseudoaneurysm or obvious direct hemorrhage was identified. There is a paucity of distal arterial is in the right inferior kidney at the site of cryoablation. A Progreat Omega and 0.014 inch synchro soft wire within directed into an inferior segmental renal artery. Additional renal angiogram was performed which demonstrated scattered focal hyperemia about the ablation zone. The central, interlobar branches to the treated mass were stagnant. Given the hyperemia and irregularity of the vessels, decision was made to provide Gel-Foam slurry embolization. Approximately 2 mL of diluted Gel-Foam slurry or administered 2 separate branches of the right inferior segmental renal artery to an endpoint of near stagnation. No reflux was observed. The microcatheter was retracted into the proximal renal artery and repeat angiogram was again performed. There was pruning of the inferior pole renal arteries without evidence of active extravasation. The  catheters were removed. The right groin sheath was exchanged over a J wire for a 6 French Angio-Seal closure device which was deployed without complication. The patient tolerated the procedure well was transferred to the postanesthesia care unit in good condition. IMPRESSION: 1. Post ablation changes to the inferior pole of the right kidney without definite pseudoaneurysm or active extravasation visualized. 2. Successful Gel-Foam embolization of the right inferior segmental renal artery. Ruthann Cancer, MD [facility] Radiology Electronically Signed   By: Ruthann Cancer MD   On: 11/18/2020 15:10   IR US Guide Vasc Access Right  Result Date: 11/18/2020 INDICATION: 80 year old female with history of right renal mass status post cryoablation complicated by immediate postprocedural expanding perinephric hemorrhage. EXAM: 1. Ultrasound-guided vascular access the right common femoral artery 2. Catheterization of the abdominal aorta 3. Abdominal aortogram 4. Selective catheterization and angiogram of the right renal artery 5. Sub selective catheterization and angiogram of right  inferior segmental renal artery 6. Gel-Foam embolization of right inferior segmental renal artery MEDICATIONS: None. ANESTHESIA/SEDATION: Moderate (conscious) sedation was employed during this procedure. A total of Versed 0.5 mg and Fentanyl 50 mcg was administered intravenously. Moderate Sedation Time: 52 minutes. The patient's level of consciousness and vital signs were monitored continuously by radiology nursing throughout the procedure under my direct supervision. CONTRAST:  35mL OMNIPAQUE IOHEXOL 300 MG/ML SOLN, 47mL OMNIPAQUE IOHEXOL 300 MG/ML SOLN FLUOROSCOPY TIME:  Fluoroscopy Time: 6 minutes 54 seconds (654 mGy). COMPLICATIONS: None immediate. PROCEDURE: Informed consent was obtained from the patient following explanation of the procedure, risks, benefits and alternatives. The patient  understands, agrees and consents for the procedure. All questions were addressed. A time out was performed prior to the initiation of the procedure. Maximal barrier sterile technique utilized including caps, mask, sterile gowns, sterile gloves, large sterile drape, hand hygiene, and Betadine prep. The right groin was prepped and draped in standard fashion. Preprocedure ultrasound evaluation demonstrated patent right common femoral artery without significant atherosclerotic burden. The procedure was planned. Subdermal Local anesthesia was provided with 1% lidocaine. A small skin nick was made. Under direct ultrasound visualization, a 21 gauge micropuncture needle was directed into the common femoral artery. A micropuncture set was inserted and a limited right lower extremity angiogram was performed which demonstrated adequate puncture site for use of a closure device. A J wire was directed to the suprarenal abdominal aorta. A 5 French, 11 cm vascular sheath was placed and connected to a pressurized heparinized saline flush. A pigtail catheter was then inserted over the wire and the wire removed. Abdominal angiogram was performed. Angiogram demonstrates patent celiac trunk, dual right and single left renal arteries, SMA, and aortic bifurcation. The aorta is tortuous but normal in caliber. The inferior right renal artery was then selected with a C2 catheter. Right renal angiogram was then performed. No pseudoaneurysm or obvious direct hemorrhage was identified. There is a paucity of distal arterial is in the right inferior kidney at the site of cryoablation. A Progreat Omega and 0.014 inch synchro soft wire within directed into an inferior segmental renal artery. Additional renal angiogram was performed which demonstrated scattered focal hyperemia about the ablation zone. The central, interlobar branches to the treated mass were stagnant. Given the hyperemia and irregularity of the vessels, decision was made to provide  Gel-Foam slurry embolization. Approximately 2 mL of diluted Gel-Foam slurry or administered 2 separate branches of the right inferior segmental renal artery to an endpoint of near stagnation. No reflux was observed. The microcatheter was retracted into the proximal renal artery and repeat angiogram was again performed. There was pruning of the inferior pole renal arteries without evidence of active extravasation. The catheters were removed. The right groin sheath was exchanged over a J wire for a 6 French Angio-Seal closure device which was deployed without complication. The patient tolerated the procedure well was transferred to the postanesthesia care unit in good condition. IMPRESSION: 1. Post ablation changes to the inferior pole of the right kidney without definite pseudoaneurysm or active extravasation visualized. 2. Successful Gel-Foam embolization of the right inferior segmental renal artery. Ruthann Cancer, MD [facility] Radiology Electronically Signed   By: Ruthann Cancer MD   On: 11/18/2020 15:10   CT GUIDE TISSUE ABLATION  Result Date: 11/18/2020 INDICATION: 80 year old woman with solid enhancing right renal mass presents to interventional radiology for biopsy and ablation. EXAM: 1. CT-guided cryoablation of solid enhancing right renal mass 2. CT-guided  biopsy of solid enhancing right renal mass COMPARISON:  MRI abdomen 08/17/2020 MEDICATIONS: None ANESTHESIA/SEDATION: General - as administered by the Anesthesia department COMPLICATIONS: SIR LEVEL C - Requires therapy, minor hospitalization (<48 hrs). TECHNIQUE: Informed written consent was obtained from the patient after a thorough discussion of the procedural risks, benefits and alternatives. All questions were addressed. Maximal Sterile Barrier Technique was utilized including caps, mask, sterile gowns, sterile gloves, sterile drape, hand hygiene and skin antiseptic. A timeout was performed prior to  the initiation of the procedure. Patient positioned prone on the CT table. The right flank was prepped and draped in the usual sterile fashion. Contrast enhanced CT performed to better delineate the lesion. Following local lidocaine administration, 1 Ice Force Cyro probe was inserted in into the lesion utilizing CT guidance. 41 gauge biopsy introducer needle was inserted into the lateral portion of the lesion parallel to the ablation probe utilizing CT guidance. 218 gauge cores were obtained from the lesion. The introducer needle was removed. Cryoablation was then performed with 10 minutes of freezing followed by 5 minutes of thawing and finally 10 additional minutes of freezing. Non-contrast CT was performed at 8 minutes into the second freezing cycle to demonstrate maximum ice ball size. 5 minute thaw was performed followed by cauterization of all needles. Needles were sequentially removed with care not to avulse the ice ball. Post treatment contrast enhanced CT scan demonstrated rapidly expanding hematoma with active extravasation. The patient was extubated and transferred to angiography suite for embolization. IMPRESSION: 1. CT-guided cryoablation of enhancing solid right renal mass. 2. CT-guided biopsy of solid enhancing right renal mass prior to cryoablation. 3. Post-procedure complication of rapidly expanding hematoma. Patient transferred to interventional radiology for angiogram and embolization. Electronically Signed   By: Miachel Roux M.D.   On: 11/18/2020 17:09   CT BIOPSY  Result Date: 11/18/2020 INDICATION: 80 year old woman with solid enhancing right renal mass presents to interventional radiology for biopsy and ablation. EXAM: 1. CT-guided cryoablation of solid enhancing right renal mass 2. CT-guided biopsy of solid enhancing right renal mass COMPARISON:  MRI abdomen 08/17/2020 MEDICATIONS: None ANESTHESIA/SEDATION: General - as administered by the Anesthesia department COMPLICATIONS: SIR LEVEL C  - Requires therapy, minor hospitalization (<48 hrs). TECHNIQUE: Informed written consent was obtained from the patient after a thorough discussion of the procedural risks, benefits and alternatives. All questions were addressed. Maximal Sterile Barrier Technique was utilized including caps, mask, sterile gowns, sterile gloves, sterile drape, hand hygiene and skin antiseptic. A timeout was performed prior to the initiation of the procedure. Patient positioned prone on the CT table. The right flank was prepped and draped in the usual sterile fashion. Contrast enhanced CT performed to better delineate the lesion. Following local lidocaine administration, 1 Ice Force Cyro probe was inserted in into the lesion utilizing CT guidance. 59 gauge biopsy introducer needle was inserted into the lateral portion of the lesion parallel to the ablation probe utilizing CT guidance. 218 gauge cores were obtained from the lesion. The introducer needle was removed. Cryoablation was then performed with 10 minutes of freezing followed by 5 minutes of thawing and finally 10 additional minutes of freezing. Non-contrast CT was performed at 8 minutes into the second freezing cycle to demonstrate maximum ice ball size. 5 minute thaw was performed followed by cauterization of all needles. Needles were sequentially removed with care not to avulse the ice ball. Post treatment contrast enhanced CT scan demonstrated rapidly expanding hematoma with active extravasation. The patient was extubated and transferred  to angiography suite for embolization. IMPRESSION: 1. CT-guided cryoablation of enhancing solid right renal mass. 2. CT-guided biopsy of solid enhancing right renal mass prior to cryoablation. 3. Post-procedure complication of rapidly expanding hematoma. Patient transferred to interventional radiology for angiogram and embolization. Electronically Signed   By: Miachel Roux M.D.   On: 11/18/2020 17:09   IR EMBO ART  VEN HEMORR LYMPH EXTRAV   INC GUIDE ROADMAPPING  Result Date: 11/18/2020 INDICATION: 80 year old female with history of right renal mass status post cryoablation complicated by immediate postprocedural expanding perinephric hemorrhage. EXAM: 1. Ultrasound-guided vascular access the right common femoral artery 2. Catheterization of the abdominal aorta 3. Abdominal aortogram 4. Selective catheterization and angiogram of the right renal artery 5. Sub selective catheterization and angiogram of right inferior segmental renal artery 6. Gel-Foam embolization of right inferior segmental renal artery MEDICATIONS: None. ANESTHESIA/SEDATION: Moderate (conscious) sedation was employed during this procedure. A total of Versed 0.5 mg and Fentanyl 50 mcg was administered intravenously. Moderate Sedation Time: 52 minutes. The patient's level of consciousness and vital signs were monitored continuously by radiology nursing throughout the procedure under my direct supervision. CONTRAST:  29mL OMNIPAQUE IOHEXOL 300 MG/ML SOLN, 35mL OMNIPAQUE IOHEXOL 300 MG/ML SOLN FLUOROSCOPY TIME:  Fluoroscopy Time: 6 minutes 54 seconds (654 mGy). COMPLICATIONS: None immediate. PROCEDURE: Informed consent was obtained from the patient following explanation of the procedure, risks, benefits and alternatives. The patient understands, agrees and consents for the procedure. All questions were addressed. A time out was performed prior to the initiation of the procedure. Maximal barrier sterile technique utilized including caps, mask, sterile gowns, sterile gloves, large sterile drape, hand hygiene, and Betadine prep. The right groin was prepped and draped in standard fashion. Preprocedure ultrasound evaluation demonstrated patent right common femoral artery without significant atherosclerotic burden. The procedure was planned. Subdermal Local anesthesia was provided with 1% lidocaine. A small skin nick was made. Under direct ultrasound visualization, a 21 gauge micropuncture  needle was directed into the common femoral artery. A micropuncture set was inserted and a limited right lower extremity angiogram was performed which demonstrated adequate puncture site for use of a closure device. A J wire was directed to the suprarenal abdominal aorta. A 5 French, 11 cm vascular sheath was placed and connected to a pressurized heparinized saline flush. A pigtail catheter was then inserted over the wire and the wire removed. Abdominal angiogram was performed. Angiogram demonstrates patent celiac trunk, dual right and single left renal arteries, SMA, and aortic bifurcation. The aorta is tortuous but normal in caliber. The inferior right renal artery was then selected with a C2 catheter. Right renal angiogram was then performed. No pseudoaneurysm or obvious direct hemorrhage was identified. There is a paucity of distal arterial is in the right inferior kidney at the site of cryoablation. A Progreat Omega and 0.014 inch synchro soft wire within directed into an inferior segmental renal artery. Additional renal angiogram was performed which demonstrated scattered focal hyperemia about the ablation zone. The central, interlobar branches to the treated mass were stagnant. Given the hyperemia and irregularity of the vessels, decision was made to provide Gel-Foam slurry embolization. Approximately 2 mL of diluted Gel-Foam slurry or administered 2 separate branches of the right inferior segmental renal artery to an endpoint of near stagnation. No reflux was observed. The microcatheter was retracted into the proximal renal artery and repeat angiogram was again performed. There was pruning of the inferior pole renal arteries without evidence of active extravasation. The catheters were removed. The  right groin sheath was exchanged over a J wire for a 6 French Angio-Seal closure device which was deployed without complication. The patient tolerated the procedure well was transferred to the postanesthesia care  unit in good condition. IMPRESSION: 1. Post ablation changes to the inferior pole of the right kidney without definite pseudoaneurysm or active extravasation visualized. 2. Successful Gel-Foam embolization of the right inferior segmental renal artery. Ruthann Cancer, MD [facility] Radiology Electronically Signed   By: Ruthann Cancer MD   On: 11/18/2020 15:10    Labs:  CBC: Recent Labs    11/11/20 1120 11/18/20 1547 11/18/20 2303 11/19/20 0827  WBC 7.6 9.9 10.6* 11.8*  HGB 13.8 14.2 12.6 12.2  HCT 43.8 45.6 39.8 40.2  PLT 233 206 202 202    COAGS: Recent Labs    11/11/20 1120  INR 1.1    BMP: Recent Labs    11/11/20 1120 11/19/20 0827  NA 139 137  K 4.0 4.0  CL 106 107  CO2 25 23  GLUCOSE 90 132*  BUN 23 17  CALCIUM 9.9 9.5  CREATININE 0.89 1.00  GFRNONAA >60 57*    LIVER FUNCTION TESTS: No results for input(s): BILITOT, AST, ALT, ALKPHOS, PROT, ALBUMIN in the last 8760 hours.  TUMOR MARKERS: No results for input(s): AFPTM, CEA, CA199, CHROMGRNA in the last 8760 hours.  Assessment and Plan:  80 year old woman s/p cryoablation of right renal mass on 11/18/2020.  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  Her post procedure course was complicated by rapidly enlarging hematoma at the ablation site which was successfully treated with angiogram and gel foam embolization.  She is currently doing well which evidence of continued hemorrhage.  We will perform a contrast enhanced MRI of the abdomen in mid July, 2022 for a three month follow up post ablation.  Thank you for this interesting consult.  I greatly enjoyed meeting CATHE BILGER and look forward to participating in their care.  A copy of this report was sent to the requesting provider on this date.  Electronically Signed: Paula Libra Butler Vegh 12/15/2020, 11:09 AM   I spent a total of    15 Minutes in remote  clinical  consultation, greater than 50% of which was counseling/coordinating care for renal mass cryoablation.    Visit type: Audio only (telephone). Audio (no video) only due to patient's lack of internet/smartphone capability. Alternative for in-person consultation at [facility], [address], [place], [place]. This visit type was conducted due to national recommendations for restrictions regarding the COVID-19 Pandemic (e.g. social distancing).  This format is felt to be most appropriate for this patient at this time.  All issues noted in this document were discussed and addressed.

## 2020-12-16 DIAGNOSIS — K838 Other specified diseases of biliary tract: Secondary | ICD-10-CM | POA: Diagnosis not present

## 2020-12-16 DIAGNOSIS — N289 Disorder of kidney and ureter, unspecified: Secondary | ICD-10-CM | POA: Diagnosis not present

## 2020-12-16 DIAGNOSIS — K219 Gastro-esophageal reflux disease without esophagitis: Secondary | ICD-10-CM | POA: Diagnosis not present

## 2020-12-19 DIAGNOSIS — S92354A Nondisplaced fracture of fifth metatarsal bone, right foot, initial encounter for closed fracture: Secondary | ICD-10-CM | POA: Diagnosis not present

## 2020-12-23 ENCOUNTER — Telehealth: Payer: Medicare PPO

## 2020-12-23 DIAGNOSIS — S92514A Nondisplaced fracture of proximal phalanx of right lesser toe(s), initial encounter for closed fracture: Secondary | ICD-10-CM | POA: Diagnosis not present

## 2020-12-30 ENCOUNTER — Ambulatory Visit: Payer: Medicare PPO | Admitting: Cardiology

## 2020-12-30 ENCOUNTER — Encounter: Payer: Self-pay | Admitting: Cardiology

## 2020-12-30 ENCOUNTER — Other Ambulatory Visit: Payer: Self-pay

## 2020-12-30 VITALS — BP 138/72 | HR 54 | Ht 65.0 in | Wt 177.0 lb

## 2020-12-30 DIAGNOSIS — I671 Cerebral aneurysm, nonruptured: Secondary | ICD-10-CM | POA: Diagnosis not present

## 2020-12-30 DIAGNOSIS — K644 Residual hemorrhoidal skin tags: Secondary | ICD-10-CM | POA: Insufficient documentation

## 2020-12-30 DIAGNOSIS — I480 Paroxysmal atrial fibrillation: Secondary | ICD-10-CM | POA: Diagnosis not present

## 2020-12-30 DIAGNOSIS — N2889 Other specified disorders of kidney and ureter: Secondary | ICD-10-CM

## 2020-12-30 DIAGNOSIS — Z95818 Presence of other cardiac implants and grafts: Secondary | ICD-10-CM | POA: Diagnosis not present

## 2020-12-30 DIAGNOSIS — Z85038 Personal history of other malignant neoplasm of large intestine: Secondary | ICD-10-CM | POA: Insufficient documentation

## 2020-12-30 DIAGNOSIS — R202 Paresthesia of skin: Secondary | ICD-10-CM | POA: Insufficient documentation

## 2020-12-30 DIAGNOSIS — I10 Essential (primary) hypertension: Secondary | ICD-10-CM

## 2020-12-30 DIAGNOSIS — E2839 Other primary ovarian failure: Secondary | ICD-10-CM | POA: Insufficient documentation

## 2020-12-30 DIAGNOSIS — E559 Vitamin D deficiency, unspecified: Secondary | ICD-10-CM | POA: Insufficient documentation

## 2020-12-30 DIAGNOSIS — R1310 Dysphagia, unspecified: Secondary | ICD-10-CM | POA: Insufficient documentation

## 2020-12-30 DIAGNOSIS — N289 Disorder of kidney and ureter, unspecified: Secondary | ICD-10-CM | POA: Insufficient documentation

## 2020-12-30 DIAGNOSIS — M8580 Other specified disorders of bone density and structure, unspecified site: Secondary | ICD-10-CM | POA: Insufficient documentation

## 2020-12-30 DIAGNOSIS — K838 Other specified diseases of biliary tract: Secondary | ICD-10-CM | POA: Insufficient documentation

## 2020-12-30 NOTE — Patient Instructions (Signed)

## 2020-12-30 NOTE — Progress Notes (Signed)
Cardiology Office Note:    Date:  12/30/2020   ID:  Kimberly Gay, DOB January 30, 1941, MRN [id number]  PCP:  Aretta Nip, MD  Cardiologist:  Jenne Campus, MD    Referring MD: Aretta Nip, MD   Chief Complaint  Patient presents with  . Follow-up  Cardiac wise I am doing well  History of Present Illness:    Kimberly Gay is a 80 y.o. female with past medical history significant for paroxysmal atrial fibrillation, there was an issue with anticoagulation see she did have a brain aneurysm eventually she end up getting watchman device which was implanted at [facility].  Since that time she is being not anticoagulated doing well.  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  She came today to my office for follow-up overall cardiac wise doing well.  Denies any chest pain tightness squeezing pressure burning chest.  She is broke her right little toe and she had difficulty walking because of this but otherwise seems to be doing well.  Past Medical History:  Diagnosis Date  . 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 Brain aneurysm 04/03/2019  . Cancer ([place]) 1994   colon-resection  . Cerebral aneurysm 04/03/2019  . Cerebral aneurysm, nonruptured 2010   not treated due to location-monitor yearly  . Dyspnea on exertion 04/03/2019  . Dysrhythmia    afib   . GERD (gastroesophageal reflux disease)    infrequent  . Guaiac + stool 02/26/2020  . Hypertension    well controlled  . Incontinence of urine   . Joint pain, knee   . Palpitations 04/03/2019  . Paroxysmal atrial fibrillation (HCC) CHA2DS2-VASc equals 4, not anticoagulated so far because of history of intracranial bleed and aneurysm 10/28/2019  . Precordial chest pain 04/03/2019  . Stroke [facility])    due to aneurysm rupture in 2010   . Urgency of urination     Past Surgical History:  Procedure Laterality Date  . ABDOMINAL HYSTERECTOMY     . ANEURYSM COILING  2010   cerebral coiling-no deficits  . BREAST EXCISIONAL BIOPSY Left 04/05/1994  . CHOLECYSTECTOMY    . CYSTOSCOPY  09/03/2012   Procedure: CYSTOSCOPY;  Surgeon: Ailene Rud, MD;  Location: [facility];  Service: Urology;  Laterality: N/A;  . FLEXIBLE SIGMOIDOSCOPY N/A 11/24/2014   Procedure: FLEXIBLE SIGMOIDOSCOPY;  Surgeon: Garlan Fair, MD;  [facility];  Service: Endoscopy;  Laterality: N/A;  . IR EMBO ART  VEN HEMORR LYMPH EXTRAV  INC GUIDE ROADMAPPING  11/18/2020  . IR RADIOLOGIST EVAL & MGMT  09/23/2020  . IR RADIOLOGIST EVAL & MGMT  12/15/2020  . IR RENAL SUPRASEL UNI S&I MOD SED  11/18/2020  . IR US GUIDE VASC ACCESS RIGHT  11/18/2020  . LAMINECTOMY    . LYSIS OF ADHESION  2000's   with cholecystectomy procedure  . PILONIDAL CYST EXCISION    . PUBOVAGINAL SLING  09/03/2012   Procedure: Gaynelle Arabian;  Surgeon: Ailene Rud, MD;  Location: [facility];  Service: Urology;  Laterality: N/A;  LYNX SLING   . RADIOLOGY WITH ANESTHESIA Right 11/18/2020   Procedure: IR WITH ANESTHESIA RENAL CRYOABLATION;  Surgeon: Arne [place], MD;  Location: [facility];  Service: Radiology;  Laterality: Right;    Current Medications: Current Meds  Medication Sig  . acetaminophen (TYLENOL) 500 MG tablet Take 500-1,000  mg by mouth every 6 (six) hours as needed for mild pain.  Marland Kitchen aspirin EC 81 MG tablet Take 1 tablet (81 mg total) by mouth daily. Swallow whole.  . cetirizine (ZYRTEC) 10 MG tablet Take 10 mg by mouth daily as needed for allergies.  . cholecalciferol (VITAMIN D) 1000 UNITS tablet Take 2,000 Units by mouth daily.   Marland Kitchen EPINEPHrine (EPIPEN) 0.3 mg/0.3 mL SOAJ injection Inject 0.3 mLs (0.3 mg total) into the muscle once. (Patient taking differently: Inject 0.3 mg into the muscle as needed for anaphylaxis.)  . furosemide (LASIX) 20 MG tablet Take 20 mg by mouth daily.   . hydrocortisone 2.5 % cream Apply 2.5  application topically daily as needed (hemorrhoids).  . metoprolol succinate (TOPROL-XL) 100 MG 24 hr tablet TAKE 1 TABLET BY MOUTH DAILY. TAKE WITH OR IMMEDIATELY FOLLOWING A MEAL. (Patient taking differently: Take 100 mg by mouth daily.)  . nitroGLYCERIN (NITROSTAT) 0.4 MG SL tablet Place 1 tablet (0.4 mg total) under the tongue every 5 (five) minutes as needed for chest pain.  . pantoprazole (PROTONIX) 40 MG tablet Take 40 mg by mouth 2 (two) times daily.  . phenylephrine-shark liver oil-mineral oil-petrolatum (PREPARATION H) 0.25-3-14-71.9 % rectal ointment Place 1 application rectally daily.  Vladimir Faster Glycol-Propyl Glycol 0.4-0.3 % SOLN Place 1 drop into both eyes in the morning and at bedtime.     Allergies:   Amoxicillin, Meperidine and related, and Wasp venom   Social History   Socioeconomic History  . Marital status: Widowed    Spouse name: Not on file  . Number of children: Not on file  . Years of education: Not on file  . Highest education level: Not on file  Occupational History  . Not on file  Tobacco Use  . Smoking status: Never Smoker  . Smokeless tobacco: Never Used  Vaping Use  . Vaping Use: Never used  Substance and Sexual Activity  . Alcohol use: No  . Drug use: No  . Sexual activity: Not on file  Other Topics Concern  . Not on file  Social History Narrative  . Not on file   Social Determinants of Health   Financial Resource Strain: Not on file  Food Insecurity: Not on file  Transportation Needs: Not on file  Physical Activity: Not on file  Stress: Not on file  Social Connections: Not on file     Family History: The patient's family history includes Breast cancer in her cousin; Diabetes in an other family member; Heart attack in her father; Seizures in her mother. ROS:   Please see the history of present illness.    All 14 point review of systems negative except as described per history of present illness  EKGs/Labs/Other Studies Reviewed:       Recent Labs: 11/19/2020: BUN 17; Creatinine, Ser 1.00; Hemoglobin 12.2; Platelets 202; Potassium 4.0; Sodium 137  Recent Lipid Panel No results found for: CHOL, TRIG, HDL, CHOLHDL, VLDL, LDLCALC, LDLDIRECT  Physical Exam:    VS:  BP 138/72 (BP Location: Right Arm, Patient Position: Sitting)   Pulse (!) 54   Ht 5\' 5"  (1.651 m)   Wt 177 lb (80.3 kg)   SpO2 97%   BMI 29.45 kg/m     Wt Readings from Last 3 Encounters:  12/30/20 177 lb (80.3 kg)  11/18/20 174 lb (78.9 kg)  06/29/20 178 lb (80.7 kg)     GEN:  Well nourished, well developed in no acute distress HEENT: Normal NECK: No JVD;  No carotid bruits LYMPHATICS: No lymphadenopathy CARDIAC: RRR, no murmurs, no rubs, no gallops RESPIRATORY:  Clear to auscultation without rales, wheezing or rhonchi  ABDOMEN: Soft, non-tender, non-distended MUSCULOSKELETAL:  No edema; No deformity  SKIN: Warm and dry LOWER EXTREMITIES: no swelling NEUROLOGIC:  Alert and oriented x 3 PSYCHIATRIC:  Normal affect   ASSESSMENT:    1. Paroxysmal atrial fibrillation (HCC)   2. Cerebral aneurysm, nonruptured   3. Primary hypertension   4. Presence of Watchman left atrial appendage closure device   5. Renal mass, right    PLAN:    In order of problems listed above:  1. Paroxysmal atrial fibrillation maintaining sinus rhythm on EKG today described to have rare episode of palpitations, not anticoagulated because of brain aneurysm, she does have a watchman device implanted. 2. History of cerebral aneurysm rupture follow-up by neurology/vascular. 3. Essential hypertension blood pressure well controlled continue present management. 4. Dyslipidemia I did review K PN which show LDL of 97 HDL of 52 is a good cholesterol we will continue present management. 5. Watchman device present. 6. Renal mass that being addressed by the radiology team apparently she did have embolization done.   Medication Adjustments/Labs and Tests Ordered: Current  medicines are reviewed at length with the patient today.  Concerns regarding medicines are outlined above.  No orders of the defined types were placed in this encounter.  Medication changes: No orders of the defined types were placed in this encounter.   Signed, Park Liter, MD, [facility] 12/30/2020 10:17 AM    [place]

## 2021-01-18 ENCOUNTER — Other Ambulatory Visit: Payer: Self-pay | Admitting: Family Medicine

## 2021-01-18 DIAGNOSIS — Z8739 Personal history of other diseases of the musculoskeletal system and connective tissue: Secondary | ICD-10-CM

## 2021-01-18 DIAGNOSIS — Z1231 Encounter for screening mammogram for malignant neoplasm of breast: Secondary | ICD-10-CM

## 2021-01-22 DIAGNOSIS — M79671 Pain in right foot: Secondary | ICD-10-CM | POA: Diagnosis not present

## 2021-01-22 DIAGNOSIS — M79674 Pain in right toe(s): Secondary | ICD-10-CM | POA: Diagnosis not present

## 2021-01-22 DIAGNOSIS — M79672 Pain in left foot: Secondary | ICD-10-CM | POA: Diagnosis not present

## 2021-01-27 ENCOUNTER — Other Ambulatory Visit: Payer: Self-pay

## 2021-01-27 ENCOUNTER — Encounter: Payer: Self-pay | Admitting: Neurology

## 2021-01-27 DIAGNOSIS — R202 Paresthesia of skin: Secondary | ICD-10-CM

## 2021-02-04 ENCOUNTER — Other Ambulatory Visit: Payer: Self-pay | Admitting: Interventional Radiology

## 2021-02-04 DIAGNOSIS — N2889 Other specified disorders of kidney and ureter: Secondary | ICD-10-CM

## 2021-02-09 ENCOUNTER — Other Ambulatory Visit: Payer: Self-pay | Admitting: Interventional Radiology

## 2021-02-19 ENCOUNTER — Ambulatory Visit (HOSPITAL_COMMUNITY)
Admission: RE | Admit: 2021-02-19 | Discharge: 2021-02-19 | Disposition: A | Discharge status: Home / Self Care | Payer: Medicare PPO | Source: Ambulatory Visit | Attending: Interventional Radiology | Admitting: Interventional Radiology

## 2021-02-19 ENCOUNTER — Other Ambulatory Visit: Payer: Self-pay

## 2021-02-19 DIAGNOSIS — Z9049 Acquired absence of other specified parts of digestive tract: Secondary | ICD-10-CM | POA: Diagnosis not present

## 2021-02-19 DIAGNOSIS — N2889 Other specified disorders of kidney and ureter: Secondary | ICD-10-CM | POA: Diagnosis not present

## 2021-02-19 DIAGNOSIS — K769 Liver disease, unspecified: Secondary | ICD-10-CM | POA: Diagnosis not present

## 2021-02-19 DIAGNOSIS — N281 Cyst of kidney, acquired: Secondary | ICD-10-CM | POA: Diagnosis not present

## 2021-02-19 IMAGING — MR MR ABDOMEN WO/W CM
19 series · 48 of 48 positions shown · IV contrast (gadavist)
Comparison: Abdominal MRI [DATE].

CLINICAL DATA: 79-year-old female with history of right renal mass
status post cryoablation. Follow-up study.

EXAM:
MRI ABDOMEN WITHOUT AND WITH CONTRAST
TECHNIQUE: Multiplanar multisequence MR imaging of the abdomen was performed
both before and after the administration of intravenous contrast.
CONTRAST:  7mL GADAVIST GADOBUTROL 1 MMOL/ML IV SOLN

[Series 3: T2 · coronal · 6.0mm · 1.56mm/px · 2 of 30 slices shown (1 of 2)]
[im 1/30]
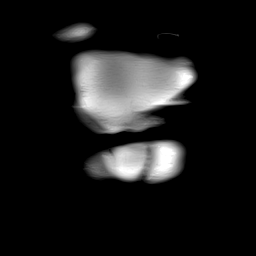
[im 30/30]
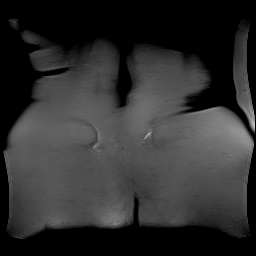

[Series 4: T2 fat-sat · 2 of 4 slices shown (1 of 2)]
[im 1/4]
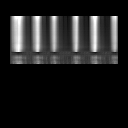
[im 4/4]
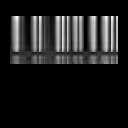

[Series 5: T2 fat-sat · axial · 6.0mm · 1.12mm/px · 1 of 36 slices shown (2 of 2)]
[im 1/36]
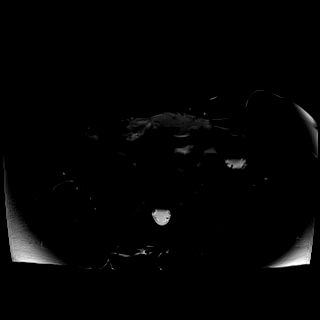

[Series 6: T1 · axial · 3.0mm · 1.16mm/px · z∈[-5,+208]mm · 3 of 72 slices shown (1 of 2)]
[im 1/72]
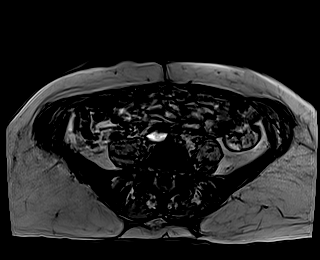
[im 36/72]
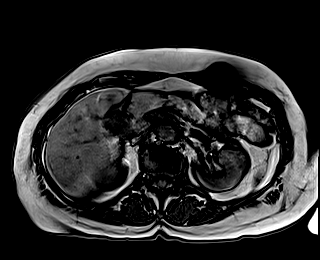
[im 72/72]
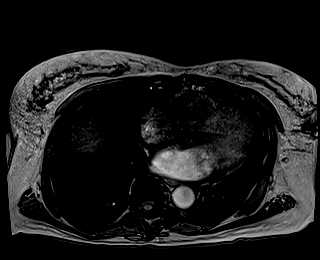

[Series 7: T1 · axial · 3.0mm · 1.16mm/px · z∈[-5,+208]mm · 3 of 72 slices shown (2 of 2)]
[im 1/72]
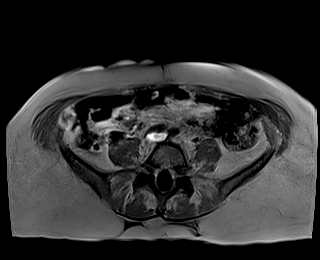
[im 36/72]
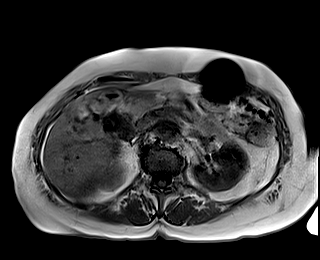
[im 72/72]
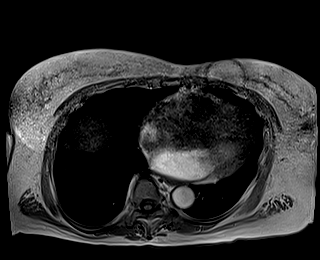

[Series 8: DWI · axial · 6.0mm · 1.38mm/px · z∈[-42,+210]mm · 3 of 72 slices shown (1 of 2)]
[im 1/72]
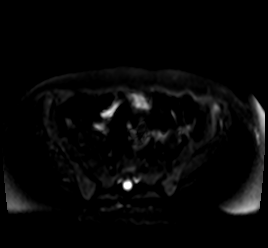
[im 36/72]
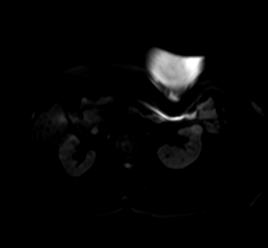
[im 72/72]
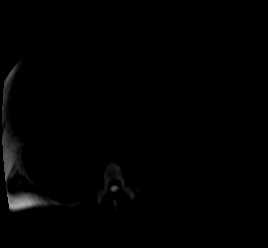

[Series 9: DWI · axial · 6.0mm · 1.38mm/px · 1 of 36 slices shown (2 of 2)]
[im 1/36]
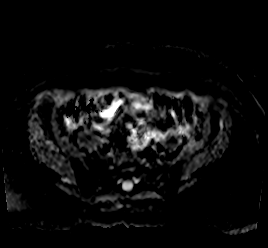

[Series 10: bSSFP · axial · 5.0mm · 0.74mm/px · z∈[-12,+202]mm · 2 of 40 slices shown]
[im 1/40]
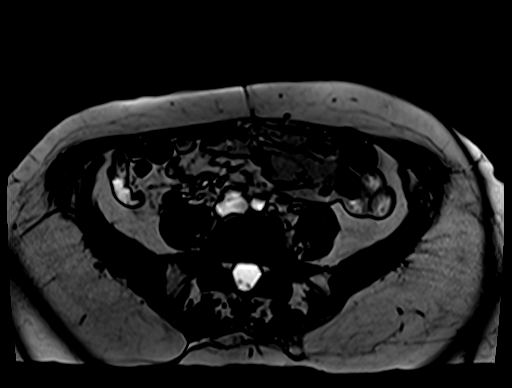
[im 40/40]
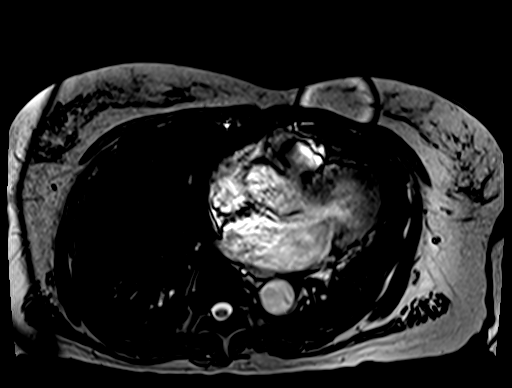

[Series 12: T1 dynamic · axial · 3.0mm · 1.19mm/px · z∈[-9,+228]mm · 3 of 80 slices shown (1 of 10)]
[im 1/80]
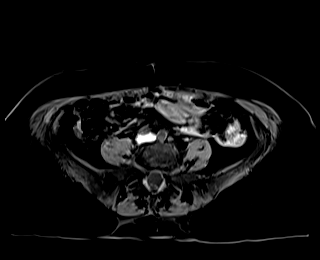
[im 40/80]
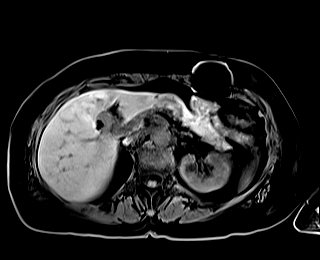
[im 80/80]
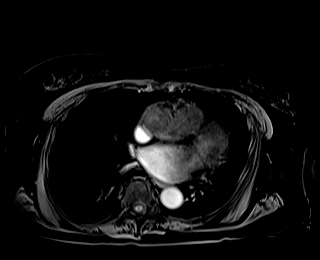

[Series 16: T1 dynamic · axial · 3.0mm · 1.19mm/px · z∈[-9,+228]mm · 3 of 80 slices shown (2 of 10)]
[im 1/80]
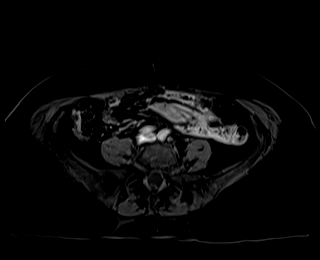
[im 40/80]
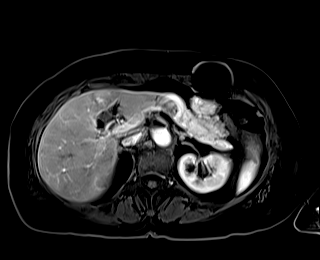
[im 80/80]
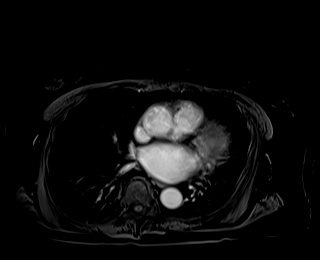

[Series 17: T1 dynamic · axial · 3.0mm · 1.19mm/px · z∈[-9,+228]mm · 3 of 80 slices shown (3 of 10)]
[im 1/80]
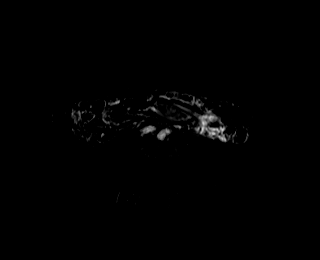
[im 40/80]
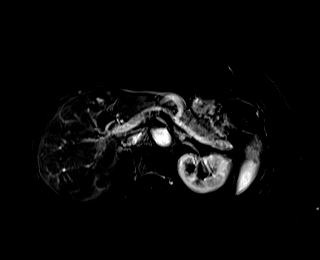
[im 80/80]
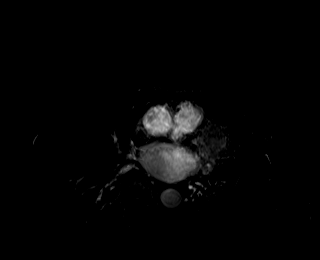

[Series 20: T1 dynamic · axial · 3.0mm · 1.19mm/px · z∈[-9,+228]mm · 3 of 80 slices shown (4 of 10)]
[im 1/80]
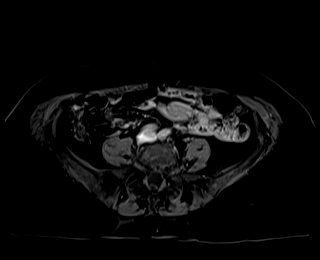
[im 40/80]
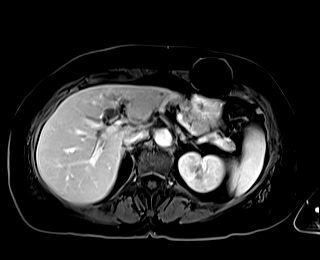
[im 80/80]
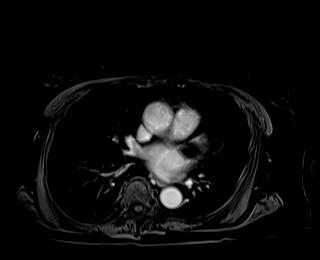

[Series 21: T1 dynamic · axial · 3.0mm · 1.19mm/px · z∈[-9,+228]mm · 3 of 80 slices shown (5 of 10)]
[im 1/80]
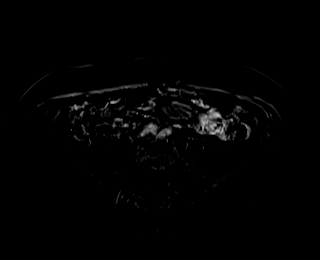
[im 40/80]
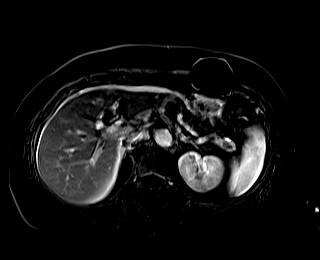
[im 80/80]
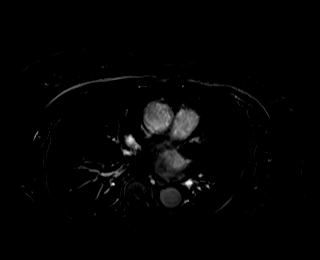

[Series 24: T1 dynamic · axial · 3.0mm · 1.19mm/px · z∈[-9,+228]mm · 3 of 80 slices shown (6 of 10)]
[im 1/80]
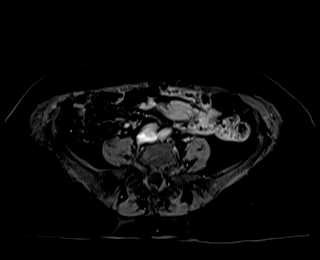
[im 40/80]
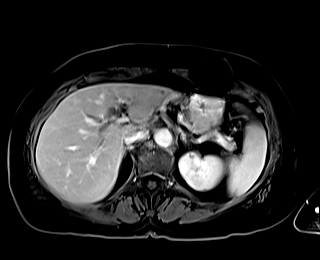
[im 80/80]
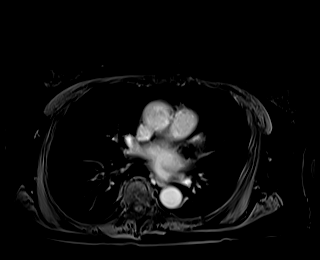

[Series 25: T1 dynamic · axial · 3.0mm · 1.19mm/px · z∈[-9,+228]mm · 3 of 80 slices shown (7 of 10)]
[im 1/80]
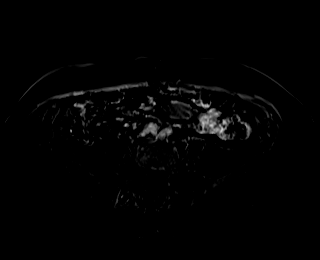
[im 40/80]
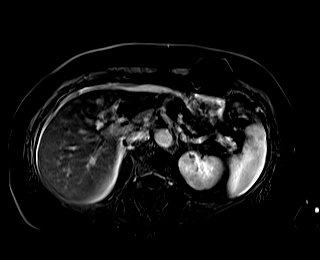
[im 80/80]
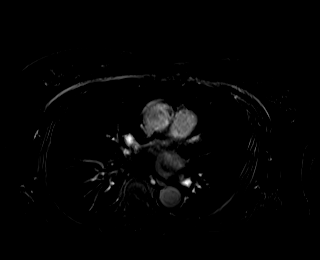

[Series 27: T1 dynamic · coronal · 3.0mm · 1.41mm/px · 3 of 72 slices shown (8 of 10)]
[im 1/72]
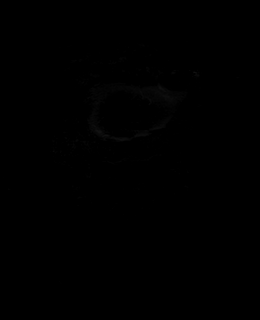
[im 36/72]
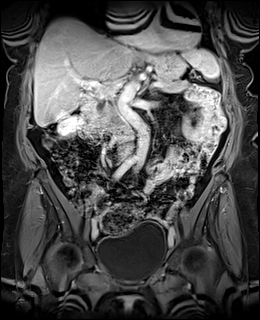
[im 72/72]
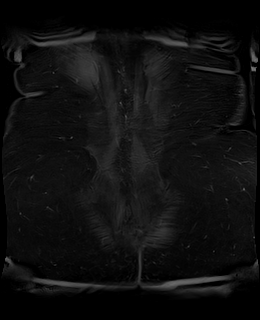

[Series 28: T2 · axial · 6.0mm · 1.48mm/px · 1 of 30 slices shown (2 of 2)]
[im 1/30]
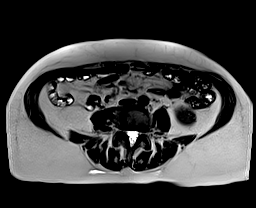

[Series 31: T1 dynamic · axial · 3.0mm · 1.19mm/px · z∈[-9,+228]mm · 3 of 80 slices shown (9 of 10)]
[im 1/80]
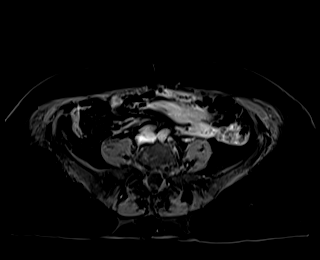
[im 40/80]
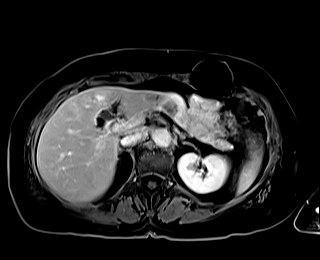
[im 80/80]
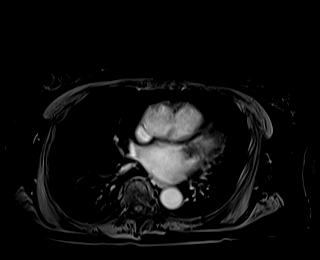

[Series 32: T1 dynamic · axial · 3.0mm · 1.19mm/px · z∈[-9,+228]mm · 3 of 80 slices shown (10 of 10)]
[im 1/80]
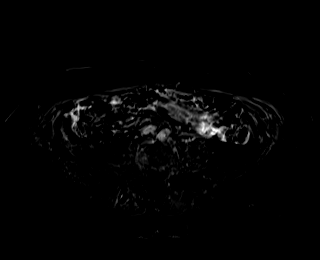
[im 40/80]
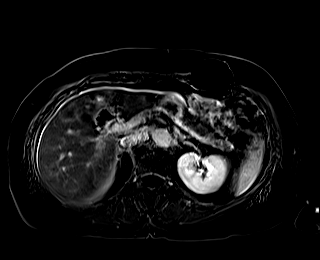
[im 80/80]
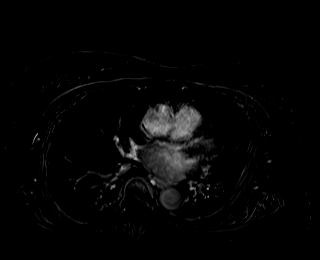

[48 of 48 positions shown; findings below may reference images not displayed]

FINDINGS: Lower chest: Unremarkable.

Hepatobiliary: 9 mm T1 hypointense, T2 hyperintense, nonenhancing
lesion in segment 2 of the liver, compatible with a small simple
cyst. No other suspicious hepatic lesions. Mild intrahepatic biliary
ductal dilatation. Common bile duct measures up to 14 mm in the
porta hepatis. These findings are similar to prior abdominal MRI
[DATE], and likely reflective of benign post cholecystectomy
physiology. No filling defect in the common bile duct to suggest the
presence of choledocholithiasis. Status post cholecystectomy.

Pancreas: No pancreatic mass. No pancreatic ductal dilatation. No
pancreatic or peripancreatic fluid collections or inflammatory
changes.

Spleen:  Unremarkable.

Adrenals/Urinary Tract: In the lateral aspect of the lower pole of
the right kidney there are post ablation changes with a 2.5 x 1.8 cm
area (axial image 27 of series 28) which is heterogeneous in signal
intensity on T1 and T2 weighted images, but demonstrates no definite
enhancement on post gadolinium imaging to suggest residual or
locally recurrent disease. Several small T1 hypointense, T2
hyperintense, nonenhancing lesions in both kidneys are compatible
with simple cysts, largest of which is in the anterior aspect of the
interpolar region of the left kidney measuring 2.1 cm in diameter.
No other suspicious renal lesions. No hydroureteronephrosis in the
visualized portions of the abdomen. Duplication of the left renal
collecting system and proximal left ureter (normal anatomical
variant) incidentally noted. Bilateral adrenal glands are normal in
appearance.

Stomach/Bowel: Visualized portions are unremarkable.

Vascular/Lymphatic: No aneurysm identified in the visualized
abdominal vasculature. No lymphadenopathy noted in the abdomen.

Other: No significant volume of ascites noted in the visualized
portions of the peritoneal cavity.

Musculoskeletal: No aggressive appearing osseous lesions are noted
in the visualized portions of the skeleton.
IMPRESSION: 1. Expected post ablation changes in the lateral aspect of the lower
pole of the right kidney with no findings to suggest residual or
locally recurrent disease.
2. Multiple simple cysts in the kidneys bilaterally, similar to the
prior study.
3. Status post cholecystectomy with persistent intra and
extrahepatic biliary ductal dilatation. No choledocholithiasis.
Given the stability compared to the prior study, findings likely
reflect benign post cholecystectomy physiology.

## 2021-02-19 MED ORDER — GADOBUTROL 1 MMOL/ML IV SOLN
7.0000 mL | Freq: Once | INTRAVENOUS | Status: AC | PRN
  Administered 2021-02-19: 7 mL via INTRAVENOUS

## 2021-02-23 ENCOUNTER — Encounter: Payer: Self-pay | Admitting: *Deleted

## 2021-02-23 ENCOUNTER — Ambulatory Visit
Admission: RE | Admit: 2021-02-23 | Discharge: 2021-02-23 | Disposition: A | Discharge status: Home / Self Care | Payer: Medicare PPO | Source: Ambulatory Visit | Attending: Interventional Radiology | Admitting: Interventional Radiology

## 2021-02-23 ENCOUNTER — Other Ambulatory Visit: Payer: Self-pay

## 2021-02-23 DIAGNOSIS — N2889 Other specified disorders of kidney and ureter: Secondary | ICD-10-CM

## 2021-02-23 DIAGNOSIS — Z9889 Other specified postprocedural states: Secondary | ICD-10-CM | POA: Diagnosis not present

## 2021-02-23 HISTORY — PX: IR RADIOLOGIST EVAL & MGMT: IMG5224

## 2021-02-23 NOTE — Progress Notes (Signed)
Referring Physician(s): Klyde Banka R  Chief Complaint: The patient is seen in follow up today s/p cryoablation of right renal mass.  History of present illness:  Kimberly Gay is a 80 y.o. female with remote history of colon cancer s/p resection, presented with RUQ pain in January, 2022.  MRI of abdomen subsequently showed a solid right renal mass. She underwent cryoablation of this mass on 11/18/2020.  The procedure was complicated by rapidly enlarging hematoma which required angiogram and embolization.  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   She feels well today.  She denies hematuria, flank pain, abdominal pain, chest pain, or shortness of breath.   MRI performed on 02/19/2021 shows expected post ablation changes in the right kidney.  No abnormal enhancement to indicate residual or recurrent tumor.  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[place])    Brain aneurysm 04/03/2019   Cancer ([place]) 1994   colon-resection   Cerebral aneurysm 04/03/2019   Cerebral aneurysm, nonruptured 2010   not treated due to location-monitor yearly   Dyspnea on exertion 04/03/2019   Dysrhythmia    afib    GERD (gastroesophageal reflux disease)    infrequent   Guaiac + stool 02/26/2020   Hypertension    well controlled   Incontinence of urine    Joint pain, knee    Palpitations 04/03/2019   Paroxysmal atrial fibrillation (HCC) CHA2DS2-VASc equals 4, not anticoagulated so far because of history of intracranial bleed and aneurysm 10/28/2019   Precordial chest pain 04/03/2019   Stroke [facility])    due to aneurysm rupture in 2010    Urgency of urination     Past Surgical History:  Procedure Laterality Date   ABDOMINAL HYSTERECTOMY     ANEURYSM COILING  2010   cerebral coiling-no deficits   BREAST EXCISIONAL BIOPSY Left 04/05/1994   CHOLECYSTECTOMY     CYSTOSCOPY  09/03/2012   Procedure: CYSTOSCOPY;   Surgeon: Ailene Rud, MD;  Location: [place];  Service: Urology;  Laterality: N/A;   FLEXIBLE SIGMOIDOSCOPY N/A 11/24/2014   Procedure: FLEXIBLE SIGMOIDOSCOPY;  Surgeon: Garlan Fair, MD;  [facility];  Service: Endoscopy;  Laterality: N/A;   IR EMBO ART  VEN HEMORR LYMPH EXTRAV  INC GUIDE ROADMAPPING  11/18/2020   IR RADIOLOGIST EVAL & MGMT  09/23/2020   IR RADIOLOGIST EVAL & MGMT  12/15/2020   IR RADIOLOGIST EVAL & MGMT  02/23/2021   IR RENAL SUPRASEL UNI S&I MOD SED  11/18/2020   IR US GUIDE VASC ACCESS RIGHT  11/18/2020   LAMINECTOMY     LYSIS OF ADHESION  2000's   with cholecystectomy procedure   PILONIDAL CYST EXCISION     PUBOVAGINAL SLING  09/03/2012   Procedure: Gaynelle Arabian;  Surgeon: Ailene Rud, MD;  Location: [facility];  Service: Urology;  Laterality: N/A;  LYNX SLING    RADIOLOGY WITH ANESTHESIA Right 11/18/2020   Procedure: IR WITH ANESTHESIA RENAL CRYOABLATION;  Surgeon: Arne [place], MD;  Location[facility];  Service: Radiology;  Laterality: Right;    Allergies: Amoxicillin, Meperidine and related, and Wasp venom  Medications: Prior to Admission medications   Medication Sig Start Date End Date Taking? Authorizing Provider  acetaminophen (TYLENOL) 500 MG tablet Take 500-1,000 mg by mouth every 6 (six) hours as needed for mild pain.    [provider]  aspirin  EC 81 MG tablet Take 1 tablet (81 mg total) by mouth daily. Swallow whole. 02/14/20   Park Liter, MD  cetirizine (ZYRTEC) 10 MG tablet Take 10 mg by mouth daily as needed for allergies.    [provider]  cholecalciferol (VITAMIN D) 1000 UNITS tablet Take 2,000 Units by mouth daily.     [provider]  EPINEPHrine (EPIPEN) 0.3 mg/0.3 mL SOAJ injection Inject 0.3 mLs (0.3 mg total) into the muscle once. Patient taking differently: Inject 0.3 mg into the muscle as needed for anaphylaxis. 08/09/13   Linton Flemings, MD   furosemide (LASIX) 20 MG tablet Take 20 mg by mouth daily.  02/02/19   [provider]  hydrocortisone 2.5 % cream Apply 2.5 application topically daily as needed (hemorrhoids). 12/23/19   [provider]  metoprolol succinate (TOPROL-XL) 100 MG 24 hr tablet TAKE 1 TABLET BY MOUTH DAILY. TAKE WITH OR IMMEDIATELY FOLLOWING A MEAL. Patient taking differently: Take 100 mg by mouth daily. 08/10/20   Park Liter, MD  nitroGLYCERIN (NITROSTAT) 0.4 MG SL tablet Place 1 tablet (0.4 mg total) under the tongue every 5 (five) minutes as needed for chest pain. 04/03/19   Park Liter, MD  pantoprazole (PROTONIX) 40 MG tablet Take 40 mg by mouth 2 (two) times daily.    [provider]  phenylephrine-shark liver oil-mineral oil-petrolatum (PREPARATION H) 0.25-3-14-71.9 % rectal ointment Place 1 application rectally daily.    [provider]  Polyethyl Glycol-Propyl Glycol 0.4-0.3 % SOLN Place 1 drop into both eyes in the morning and at bedtime.    [provider]     Family History  Problem Relation Age of Onset   Diabetes Other    Breast cancer Cousin    Seizures Mother    Heart attack Father     Social History   Socioeconomic History   Marital status: Widowed    Spouse name: Not on file   Number of children: Not on file   Years of education: Not on file   Highest education level: Not on file  Occupational History   Not on file  Tobacco Use   Smoking status: Never   Smokeless tobacco: Never  Vaping Use   Vaping Use: Never used  Substance and Sexual Activity   Alcohol use: No   Drug use: No   Sexual activity: Not on file  Other Topics Concern   Not on file  Social History Narrative   Not on file   Social Determinants of Health   Financial Resource Strain: Not on file  Food Insecurity: Not on file  Transportation Needs: Not on file  Physical Activity: Not on file  Stress: Not on file  Social Connections: Not on file      Vital Signs: BP (!) 150/71 (BP Location: Right Arm)   Pulse 63   SpO2 92%   Physical Exam Constitutional:      Appearance: Normal appearance.  Pulmonary:     Effort: Pulmonary effort is normal.  Abdominal:     General: Abdomen is flat.     Palpations: Abdomen is soft.  Neurological:     Mental Status: She is alert and oriented to person, place, and time.  Psychiatric:        Mood and Affect: Mood normal.    Imaging: IR Radiologist Eval & Mgmt  Result Date: 02/23/2021 Please refer to notes tab for details about interventional procedure. (Op Note)   Labs:  CBC: Recent Labs  11/11/20 1120 11/18/20 1547 11/18/20 2303 11/19/20 0827  WBC 7.6 9.9 10.6* 11.8*  HGB 13.8 14.2 12.6 12.2  HCT 43.8 45.6 39.8 40.2  PLT 233 206 202 202    COAGS: Recent Labs    11/11/20 1120  INR 1.1    BMP: Recent Labs    11/11/20 1120 11/19/20 0827  NA 139 137  K 4.0 4.0  CL 106 107  CO2 25 23  GLUCOSE 90 132*  BUN 23 17  CALCIUM 9.9 9.5  CREATININE 0.89 1.00  GFRNONAA >60 57*    LIVER FUNCTION TESTS: No results for input(s): BILITOT, AST, ALT, ALKPHOS, PROT, ALBUMIN in the last 8760 hours.  Assessment and Plan:  80 year old woman s/p ablation and biopsy of right renal mass, returns to IR clinic for follow up.  Her most recent MRI shows no evidence of recurrent or residual renal malignancy.  She feels well today and denies hematuria or flank pain.  No further intervention planned at this time.  Plan: Repeat MRI in April, 2023 prior to 1 year follow up in clinic.  Electronically Signed: Paula Libra Noelie Renfrow 02/23/2021, 3:28 PM   I spent a total of 15 Minutes in face to face in clinical consultation, greater than 50% of which was counseling/coordinating care for right renal neoplasm.

## 2021-03-09 ENCOUNTER — Ambulatory Visit: Payer: Medicare PPO | Admitting: Neurology

## 2021-03-09 ENCOUNTER — Other Ambulatory Visit: Payer: Self-pay

## 2021-03-09 DIAGNOSIS — R202 Paresthesia of skin: Secondary | ICD-10-CM | POA: Diagnosis not present

## 2021-03-09 NOTE — Procedures (Signed)
[facility] Neurology  [address], [address]  [place], [place] [address] Tel: [phone] Fax:  [phone] Test Date:  03/09/2021  Patient: Kimberly Gay DOB: 1940/12/30 Physician: Narda Amber, DO  Sex: Female Height: '5\' 5"'$  Ref Phys: Melony Overly, MD  ID#: [id number]   Technician:    Patient Complaints: This is a 80 year old female referred for evaluation of bilateral feet pain and paresthesias.  NCV & EMG Findings: Extensive electrodiagnostic testing of the right lower extremity and additional studies of the left shows:  Bilateral sural and superficial peroneal sensory responses are within normal limits. Left peroneal motor response at the extensor digitorum brevis is absent, and normal at the tibialis anterior.  In isolation, these findings are of uncertain clinical significance.  Right peroneal and bilateral tibial motor responses are within normal limits. Bilateral tibial H reflex study is within normal limits. There is no evidence of active or chronic motor axonal loss changes affecting any of the tested muscles.  Motor unit configuration and recruitment pattern is within normal limits.  Impression: This is a normal study of the lower extremities.  In particular, there is no evidence of a large fiber sensorimotor polyneuropathy or lumbosacral radiculopathy.   ___________________________ Narda Amber, DO    Nerve Conduction Studies Anti Sensory Summary Table   Stim Site NR Peak (ms) Norm Peak (ms) P-T Amp (V) Norm P-T Amp  Left Sup Peroneal Anti Sensory (Ant Lat Mall)  33C  12 cm    2.2 <4.6 5.0 >3  Right Sup Peroneal Anti Sensory (Ant Lat Mall)  33C  12 cm    2.5 <4.6 5.8 >3  Left Sural Anti Sensory (Lat Mall)  33C  Calf    3.0 <4.6 9.6 >3  Right Sural Anti Sensory (Lat Mall)  33C  Calf    2.8 <4.6 10.2 >3   Motor Summary Table   Stim Site NR Onset (ms) Norm Onset (ms) O-P Amp (mV) Norm O-P Amp Site1 Site2 Delta-0 (ms) Dist (cm) Vel (m/s) Norm Vel  (m/s)  Left Peroneal Motor (Ext Dig Brev)  33C  Ankle NR  <6.0  >2.5 B Fib Ankle  0.0  >40  B Fib NR     Poplt B Fib  0.0  >40  Poplt NR            Right Peroneal Motor (Ext Dig Brev)  33C  Ankle    3.2 <6.0 5.8 >2.5 B Fib Ankle 7.9 37.0 47 >40  B Fib    11.1  5.2  Poplt B Fib 1.6 8.0 50 >40  Poplt    12.7  5.1         Left Peroneal TA Motor (Tib Ant)  33C  Fib Head    2.7 <4.5 5.7 >3 Poplit Fib Head 1.2 8.0 67 >40  Poplit    3.9  5.5         Left Tibial Motor (Abd Hall Brev)  33C  Ankle    4.0 <6.0 12.1 >4 Knee Ankle 8.3 41.0 49 >40  Knee    12.3  7.6         Right Tibial Motor (Abd Hall Brev)  33C  Ankle    3.2 <6.0 12.3 >4 Knee Ankle 10.1 42.0 42 >40  Knee    13.3  6.1          H Reflex Studies   NR H-Lat (ms) Lat Norm (ms) L-R H-Lat (ms)  Left Tibial (Gastroc)  33C  34.15 <35 0.00  Right Tibial (Gastroc)  33C     34.15 <35 0.00   EMG   Side Muscle Ins Act Fibs Psw Fasc Number Recrt Dur Dur. Amp Amp. Poly Poly. Comment  Right AntTibialis Nml Nml Nml Nml Nml Nml Nml Nml Nml Nml Nml Nml N/A  Right Gastroc Nml Nml Nml Nml Nml Nml Nml Nml Nml Nml Nml Nml N/A  Right Flex Dig Long Nml Nml Nml Nml Nml Nml Nml Nml Nml Nml Nml Nml N/A  Right RectFemoris Nml Nml Nml Nml Nml Nml Nml Nml Nml Nml Nml Nml N/A  Right GluteusMed Nml Nml Nml Nml Nml Nml Nml Nml Nml Nml Nml Nml N/A  Left AntTibialis Nml Nml Nml Nml Nml Nml Nml Nml Nml Nml Nml Nml N/A  Left Gastroc Nml Nml Nml Nml Nml Nml Nml Nml Nml Nml Nml Nml N/A  Left Flex Dig Long Nml Nml Nml Nml Nml Nml Nml Nml Nml Nml Nml Nml N/A      Waveforms:

## 2021-03-11 ENCOUNTER — Ambulatory Visit: Payer: Medicare PPO

## 2021-03-12 DIAGNOSIS — M79671 Pain in right foot: Secondary | ICD-10-CM | POA: Diagnosis not present

## 2021-03-19 DIAGNOSIS — I671 Cerebral aneurysm, nonruptured: Secondary | ICD-10-CM | POA: Diagnosis not present

## 2021-03-19 DIAGNOSIS — I725 Aneurysm of other precerebral arteries: Secondary | ICD-10-CM | POA: Diagnosis not present

## 2021-03-19 DIAGNOSIS — Z95828 Presence of other vascular implants and grafts: Secondary | ICD-10-CM | POA: Diagnosis not present

## 2021-04-14 DIAGNOSIS — N2889 Other specified disorders of kidney and ureter: Secondary | ICD-10-CM | POA: Diagnosis not present

## 2021-04-14 DIAGNOSIS — I10 Essential (primary) hypertension: Secondary | ICD-10-CM | POA: Diagnosis not present

## 2021-04-14 DIAGNOSIS — L821 Other seborrheic keratosis: Secondary | ICD-10-CM | POA: Diagnosis not present

## 2021-04-14 DIAGNOSIS — I671 Cerebral aneurysm, nonruptured: Secondary | ICD-10-CM | POA: Diagnosis not present

## 2021-04-14 DIAGNOSIS — Z0000 Encounter for general adult medical examination without abnormal findings: Secondary | ICD-10-CM | POA: Diagnosis not present

## 2021-04-14 DIAGNOSIS — R202 Paresthesia of skin: Secondary | ICD-10-CM | POA: Diagnosis not present

## 2021-04-14 DIAGNOSIS — I4891 Unspecified atrial fibrillation: Secondary | ICD-10-CM | POA: Diagnosis not present

## 2021-04-19 ENCOUNTER — Other Ambulatory Visit: Payer: Self-pay | Admitting: Cardiology

## 2021-05-12 DIAGNOSIS — L738 Other specified follicular disorders: Secondary | ICD-10-CM | POA: Diagnosis not present

## 2021-05-12 DIAGNOSIS — Z1283 Encounter for screening for malignant neoplasm of skin: Secondary | ICD-10-CM | POA: Diagnosis not present

## 2021-05-12 DIAGNOSIS — L7211 Pilar cyst: Secondary | ICD-10-CM | POA: Diagnosis not present

## 2021-05-14 DIAGNOSIS — S82832A Other fracture of upper and lower end of left fibula, initial encounter for closed fracture: Secondary | ICD-10-CM | POA: Diagnosis not present

## 2021-05-24 DIAGNOSIS — M79672 Pain in left foot: Secondary | ICD-10-CM | POA: Diagnosis not present

## 2021-06-14 DIAGNOSIS — M79672 Pain in left foot: Secondary | ICD-10-CM | POA: Diagnosis not present

## 2021-06-21 DIAGNOSIS — S93412D Sprain of calcaneofibular ligament of left ankle, subsequent encounter: Secondary | ICD-10-CM | POA: Diagnosis not present

## 2021-06-23 DIAGNOSIS — S93412D Sprain of calcaneofibular ligament of left ankle, subsequent encounter: Secondary | ICD-10-CM | POA: Diagnosis not present

## 2021-06-29 DIAGNOSIS — S93412D Sprain of calcaneofibular ligament of left ankle, subsequent encounter: Secondary | ICD-10-CM | POA: Diagnosis not present

## 2021-07-06 ENCOUNTER — Ambulatory Visit
Admission: RE | Admit: 2021-07-06 | Discharge: 2021-07-06 | Disposition: A | Discharge status: Home / Self Care | Payer: Medicare PPO | Source: Ambulatory Visit | Attending: Family Medicine | Admitting: Family Medicine

## 2021-07-06 DIAGNOSIS — M8589 Other specified disorders of bone density and structure, multiple sites: Secondary | ICD-10-CM | POA: Diagnosis not present

## 2021-07-06 DIAGNOSIS — Z780 Asymptomatic menopausal state: Secondary | ICD-10-CM | POA: Diagnosis not present

## 2021-07-06 DIAGNOSIS — Z8739 Personal history of other diseases of the musculoskeletal system and connective tissue: Secondary | ICD-10-CM

## 2021-07-06 DIAGNOSIS — Z1231 Encounter for screening mammogram for malignant neoplasm of breast: Secondary | ICD-10-CM

## 2021-07-06 IMAGING — MG MM DIGITAL SCREENING BILAT W/ TOMO AND CAD
6 of 10 series · 6 of 30 positions shown · non-contrast
Comparison: Previous exam(s).

CLINICAL DATA: Screening. LEFT breast excisional biopsy.

EXAM:
DIGITAL SCREENING BILATERAL MAMMOGRAM WITH TOMOSYNTHESIS AND CAD
TECHNIQUE: Bilateral screening digital craniocaudal and mediolateral oblique
mammograms were obtained. Bilateral screening digital breast
tomosynthesis was performed. The images were evaluated with
computer-aided detection.

[R MLO synth-2D (1 of 2)]
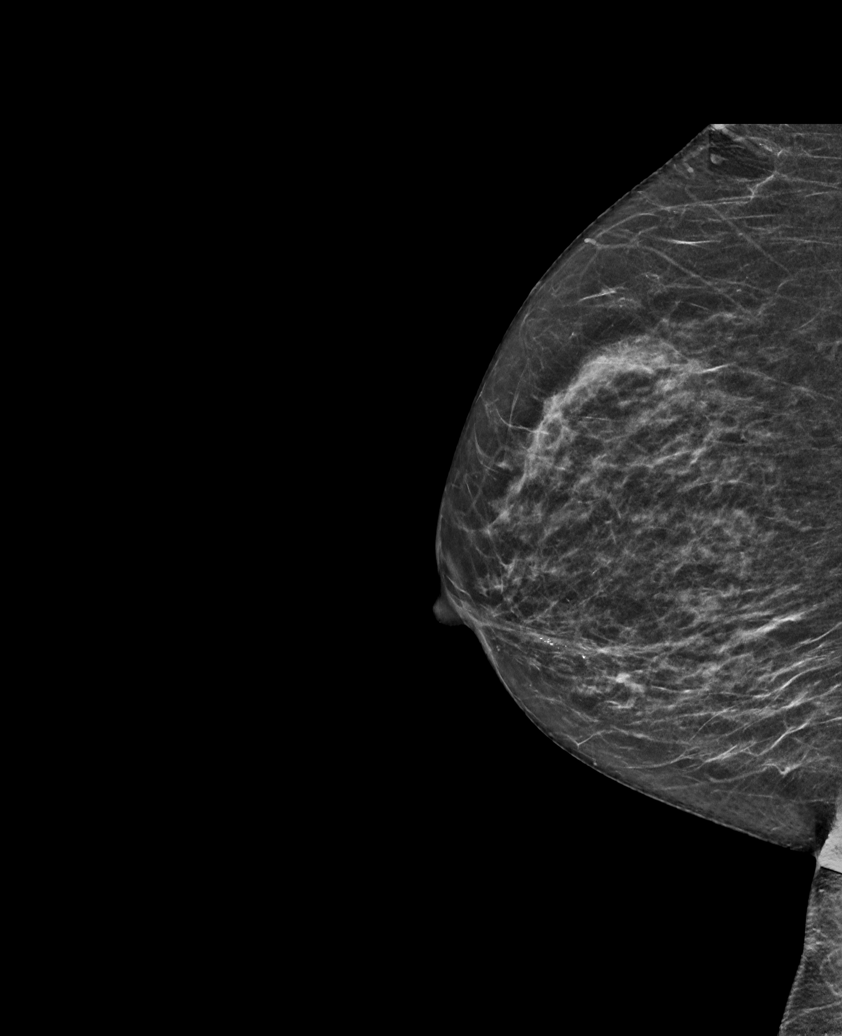

[R MLO synth-2D (2 of 2)]
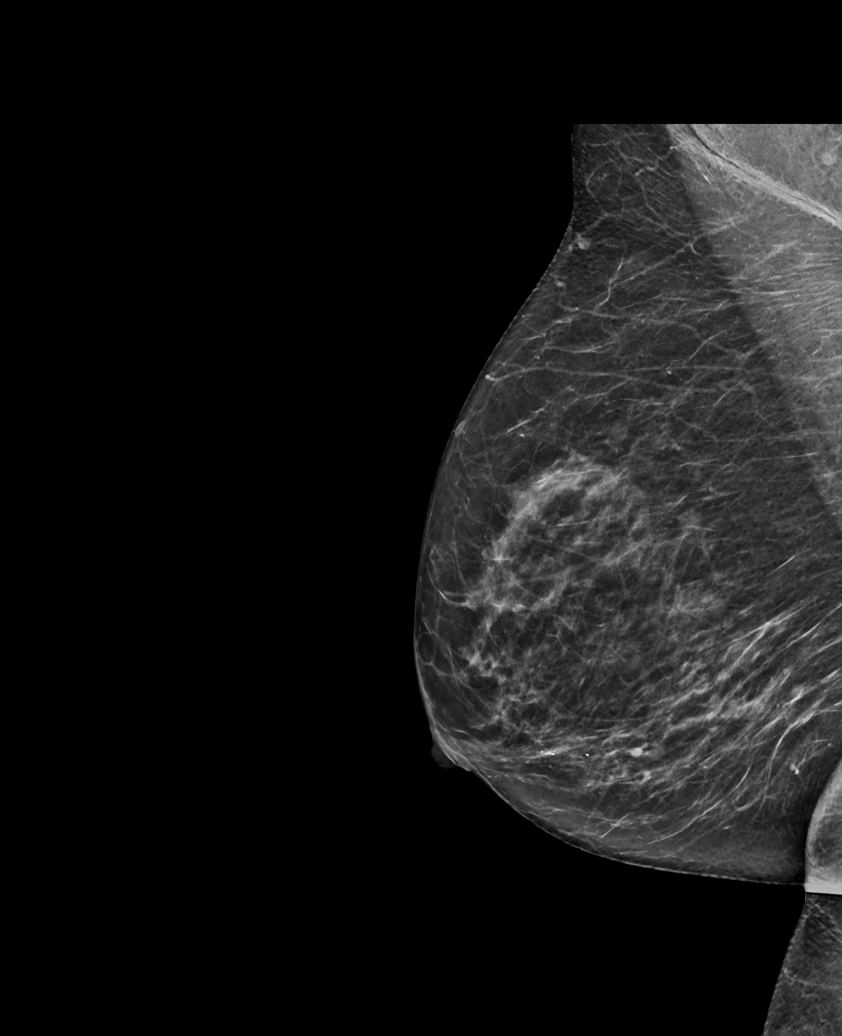

[L MLO synth-2D]
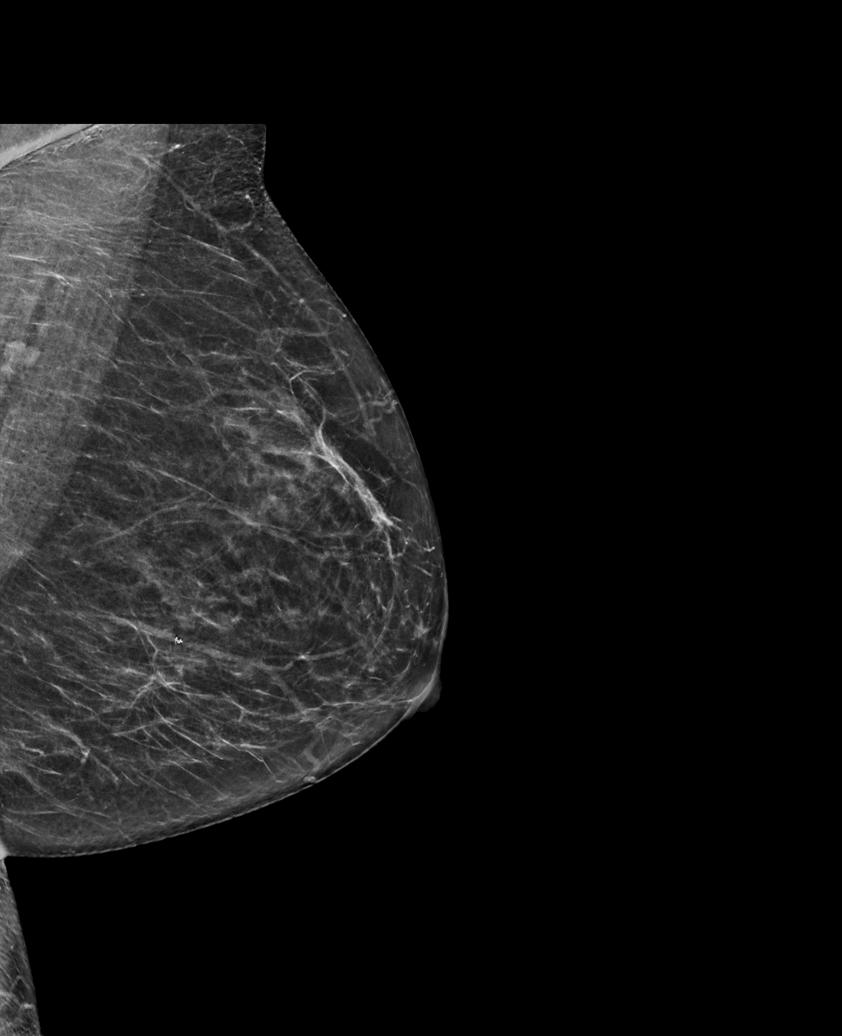

[R CC synth-2D]
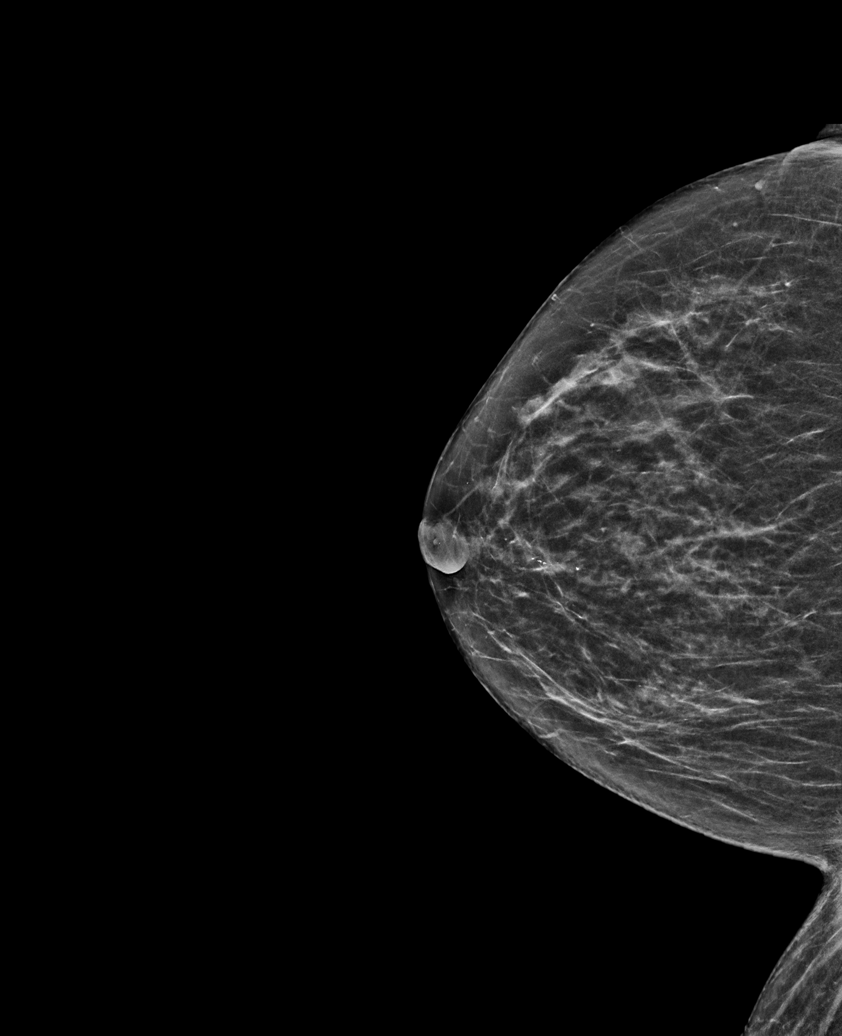

[L CC synth-2D]
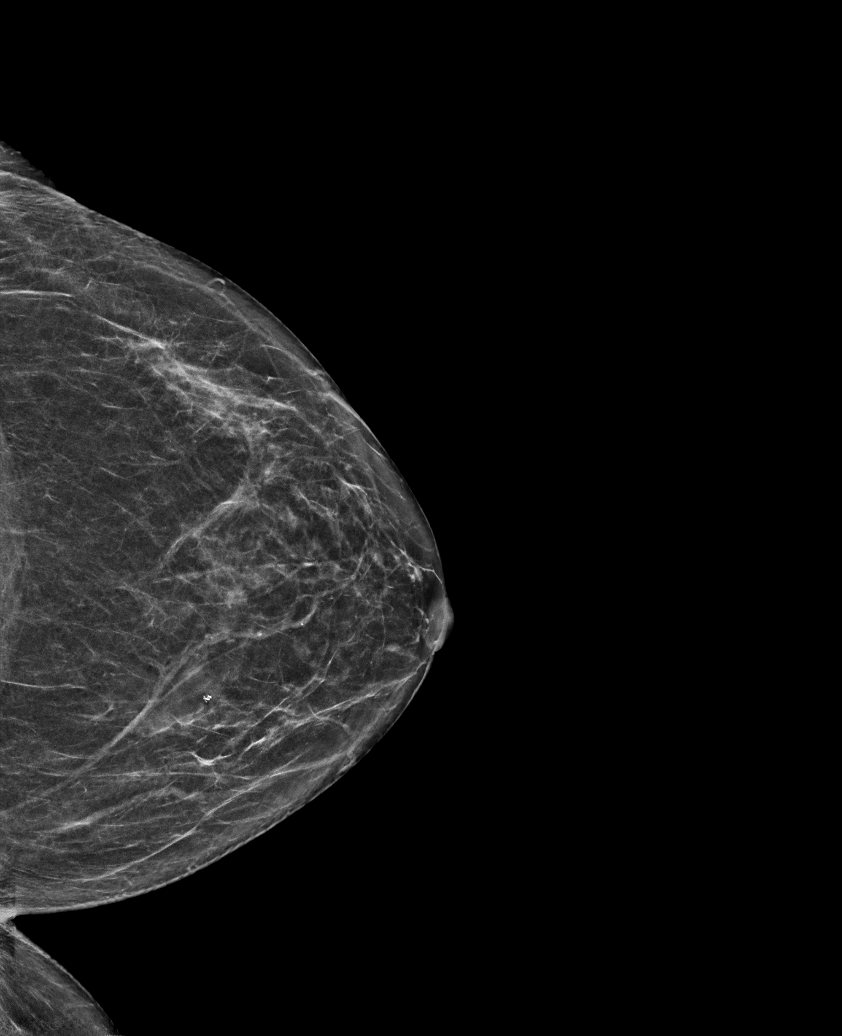

[R MLO tomo · tomo slice 32/63.0]
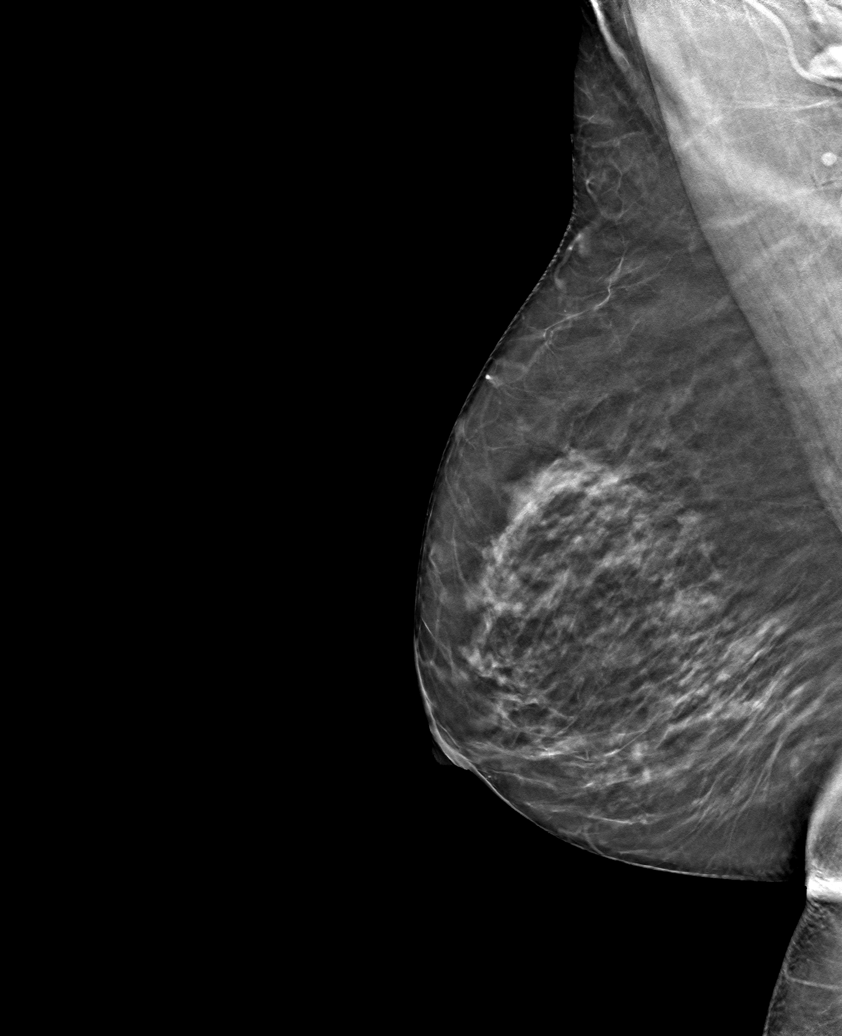

[6 of 30 positions shown; findings below may reference images not displayed]

ACR Breast Density Category b: There are scattered areas of
fibroglandular density.
FINDINGS: In the right breast, calcifications warrant further evaluation with
magnified views. In the left breast, no findings suspicious for
malignancy. Stable LEFT breast postsurgical changes.
IMPRESSION: Further evaluation is suggested for calcifications in the right
breast.

RECOMMENDATION:
Diagnostic mammogram of the right breast. (Code:[4K])

The patient will be contacted regarding the findings, and additional
imaging will be scheduled.

BI-RADS CATEGORY  0: Incomplete. Need additional imaging evaluation
and/or prior mammograms for comparison.

## 2021-07-07 DIAGNOSIS — S93412D Sprain of calcaneofibular ligament of left ankle, subsequent encounter: Secondary | ICD-10-CM | POA: Diagnosis not present

## 2021-07-08 ENCOUNTER — Other Ambulatory Visit: Payer: Self-pay | Admitting: Family Medicine

## 2021-07-08 DIAGNOSIS — R928 Other abnormal and inconclusive findings on diagnostic imaging of breast: Secondary | ICD-10-CM

## 2021-07-12 DIAGNOSIS — S93412D Sprain of calcaneofibular ligament of left ankle, subsequent encounter: Secondary | ICD-10-CM | POA: Diagnosis not present

## 2021-07-14 DIAGNOSIS — S93412D Sprain of calcaneofibular ligament of left ankle, subsequent encounter: Secondary | ICD-10-CM | POA: Diagnosis not present

## 2021-07-16 DIAGNOSIS — S93412D Sprain of calcaneofibular ligament of left ankle, subsequent encounter: Secondary | ICD-10-CM | POA: Diagnosis not present

## 2021-07-22 DIAGNOSIS — S93412D Sprain of calcaneofibular ligament of left ankle, subsequent encounter: Secondary | ICD-10-CM | POA: Diagnosis not present

## 2021-07-23 ENCOUNTER — Other Ambulatory Visit: Payer: Self-pay | Admitting: Cardiology

## 2021-07-26 DIAGNOSIS — M25572 Pain in left ankle and joints of left foot: Secondary | ICD-10-CM | POA: Diagnosis not present

## 2021-08-12 ENCOUNTER — Other Ambulatory Visit: Payer: Self-pay

## 2021-08-12 ENCOUNTER — Ambulatory Visit
Admission: RE | Admit: 2021-08-12 | Discharge: 2021-08-12 | Disposition: A | Discharge status: Home / Self Care | Payer: Medicare PPO | Source: Ambulatory Visit | Attending: Family Medicine | Admitting: Family Medicine

## 2021-08-12 ENCOUNTER — Other Ambulatory Visit: Payer: Self-pay | Admitting: Family Medicine

## 2021-08-12 DIAGNOSIS — R921 Mammographic calcification found on diagnostic imaging of breast: Secondary | ICD-10-CM | POA: Diagnosis not present

## 2021-08-12 DIAGNOSIS — R922 Inconclusive mammogram: Secondary | ICD-10-CM | POA: Diagnosis not present

## 2021-08-12 DIAGNOSIS — R928 Other abnormal and inconclusive findings on diagnostic imaging of breast: Secondary | ICD-10-CM

## 2021-08-12 IMAGING — MG MM DIGITAL DIAGNOSTIC UNILAT*R*
3 series · 3 of 3 positions shown · non-contrast
Comparison: Previous exam(s).

CLINICAL DATA: 80-year-old female for further evaluation of RIGHT
breast calcifications identified on screening mammogram.

EXAM:
DIGITAL DIAGNOSTIC UNILATERAL RIGHT MAMMOGRAM
TECHNIQUE: Right digital diagnostic mammography was performed. Mammographic
images were processed with CAD.

[R ML (1 of 2)]
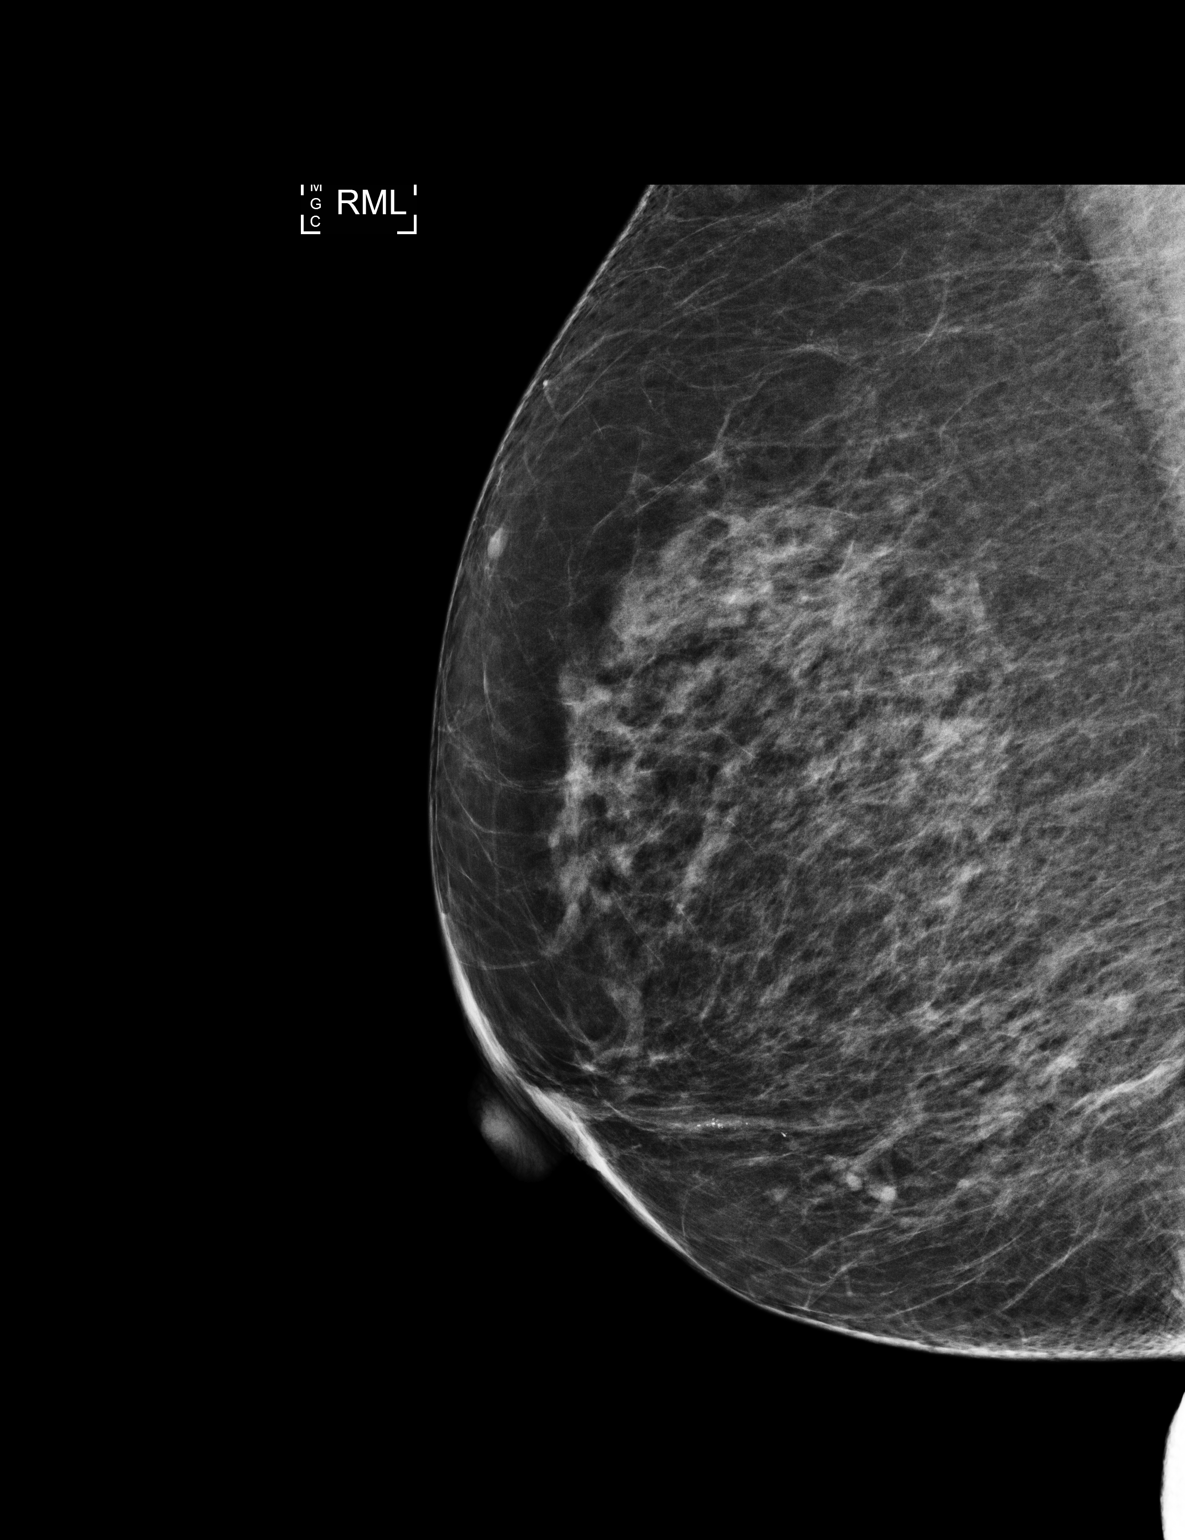

[R CC]
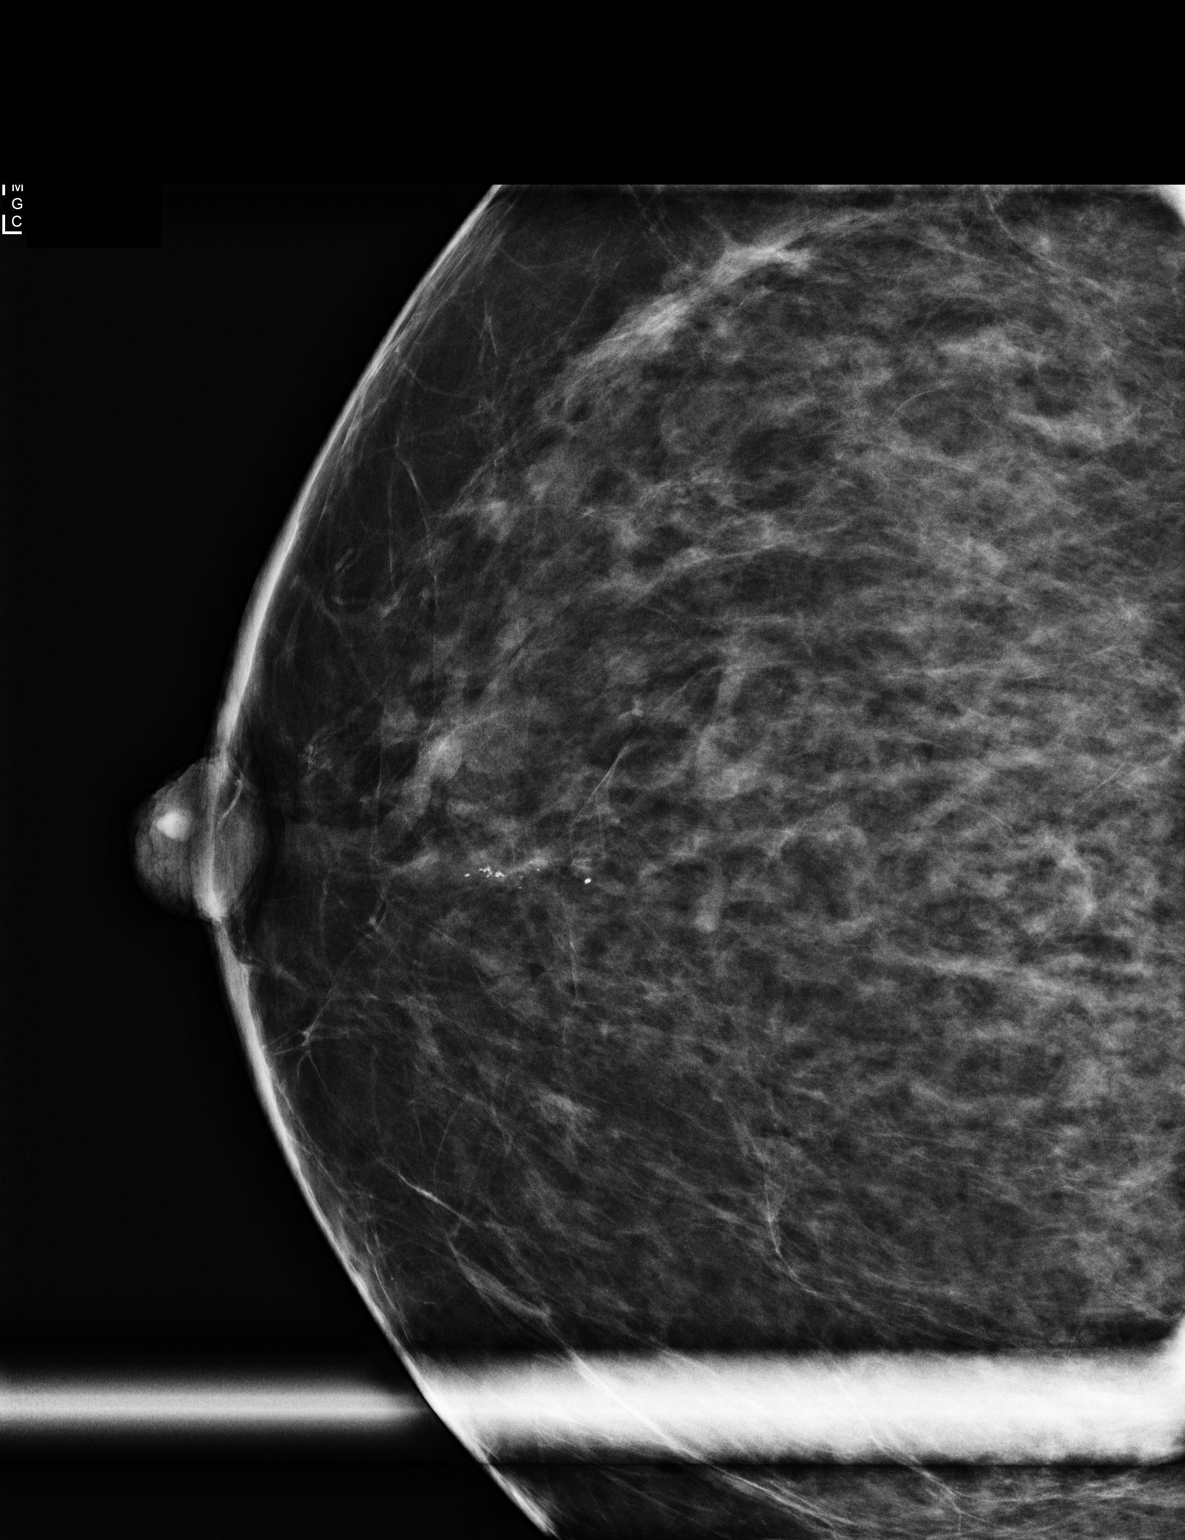

[R ML (2 of 2)]
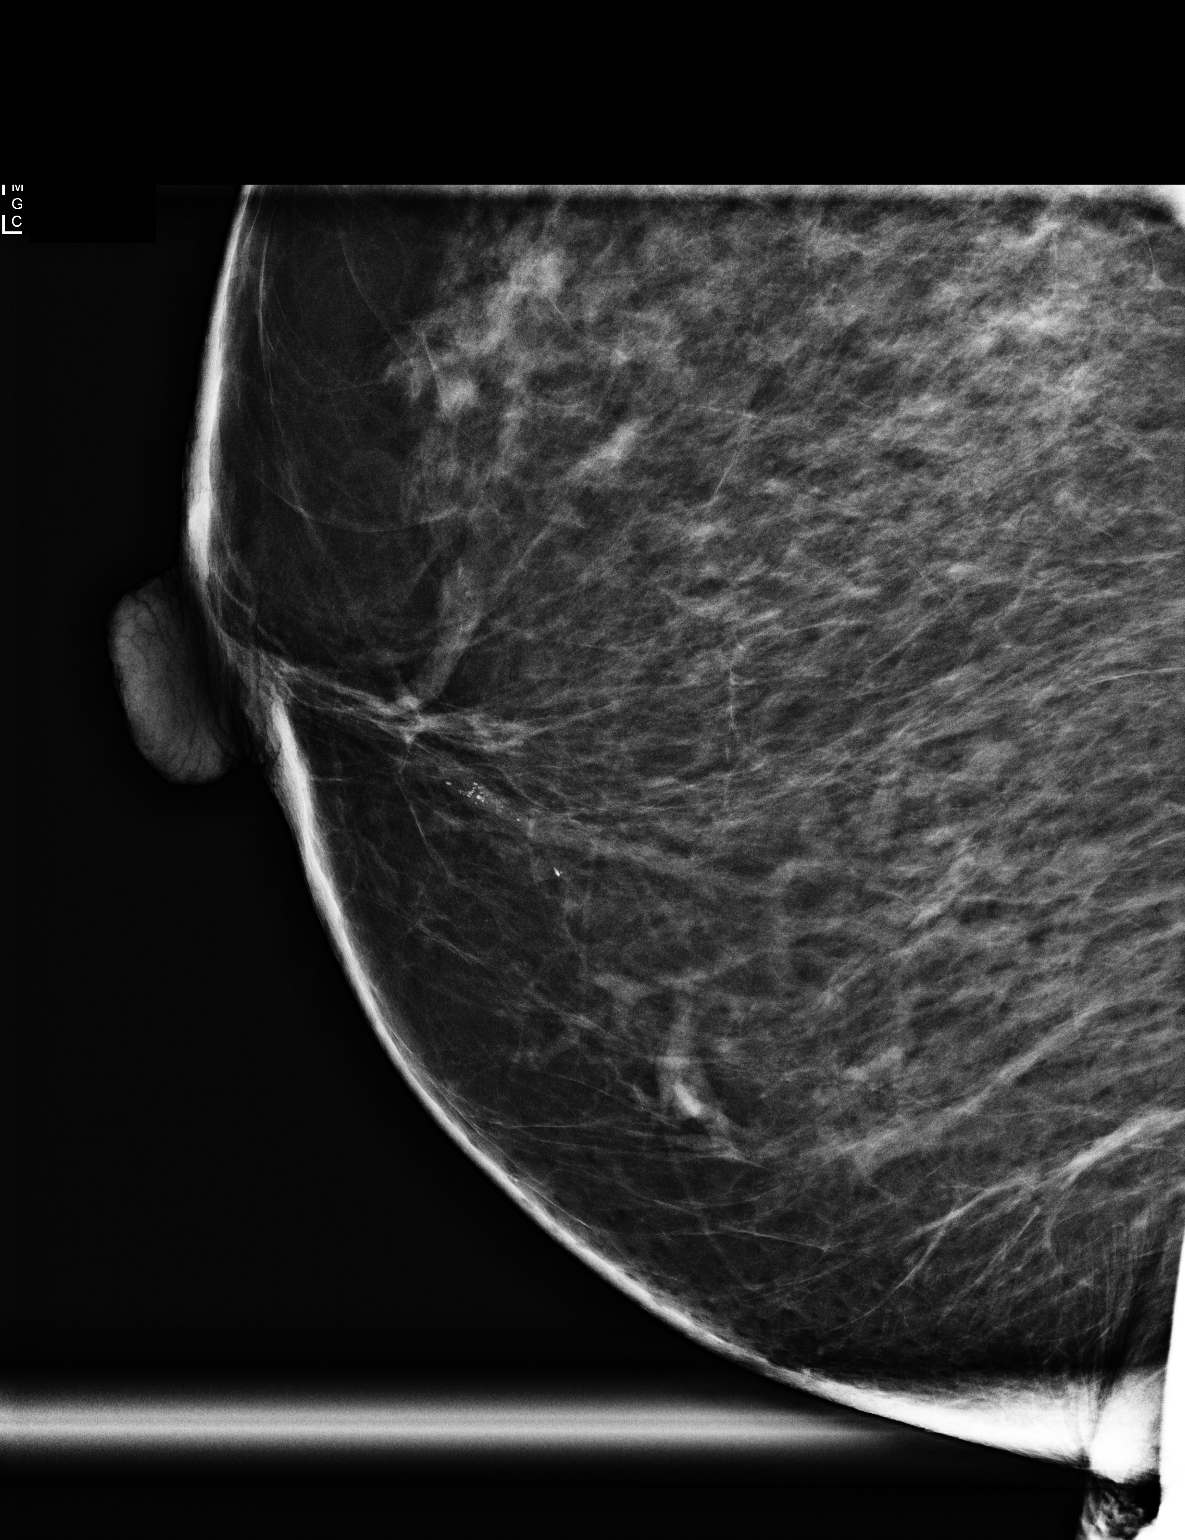

[3 of 3 positions shown; findings below may reference images not displayed]

ACR Breast Density Category b: There are scattered areas of
fibroglandular density.
FINDINGS: Full field and magnification views of the RIGHT breast demonstrate a
group of slightly heterogeneous RETROAREOLAR RIGHT breast
calcifications arranged in a linear orientation, spanning a distance
of up to 1.2 cm.
IMPRESSION: Indeterminate 1.2 cm group of RETROAREOLAR RIGHT breast
calcifications - tissue sampling is recommended.

RECOMMENDATION:
3D/stereotactic guided RIGHT breast biopsy, which will be scheduled.

I have discussed the findings and recommendations with the patient.
If applicable, a reminder letter will be sent to the patient
regarding the next appointment.

BI-RADS CATEGORY  4: Suspicious.

## 2021-08-13 DIAGNOSIS — R5383 Other fatigue: Secondary | ICD-10-CM | POA: Diagnosis not present

## 2021-08-20 ENCOUNTER — Ambulatory Visit
Admission: RE | Admit: 2021-08-20 | Discharge: 2021-08-20 | Disposition: A | Discharge status: Home / Self Care | Payer: Medicare PPO | Source: Ambulatory Visit | Attending: Family Medicine | Admitting: Family Medicine

## 2021-08-20 DIAGNOSIS — R921 Mammographic calcification found on diagnostic imaging of breast: Secondary | ICD-10-CM

## 2021-08-20 DIAGNOSIS — C50919 Malignant neoplasm of unspecified site of unspecified female breast: Secondary | ICD-10-CM

## 2021-08-20 DIAGNOSIS — D241 Benign neoplasm of right breast: Secondary | ICD-10-CM | POA: Diagnosis not present

## 2021-08-20 HISTORY — DX: Malignant neoplasm of unspecified site of unspecified female breast: C50.919

## 2021-08-20 HISTORY — PX: BREAST BIOPSY: SHX20

## 2021-08-20 IMAGING — MG MM BREAST LOCALIZATION CLIP
4 series · 4 of 12 positions shown · non-contrast
Comparison: Previous exam(s).

CLINICAL DATA: Patient with indeterminate right breast
calcifications.

EXAM:
3D DIAGNOSTIC RIGHT MAMMOGRAM POST STEREOTACTIC BIOPSY

[R LM synth-2D]
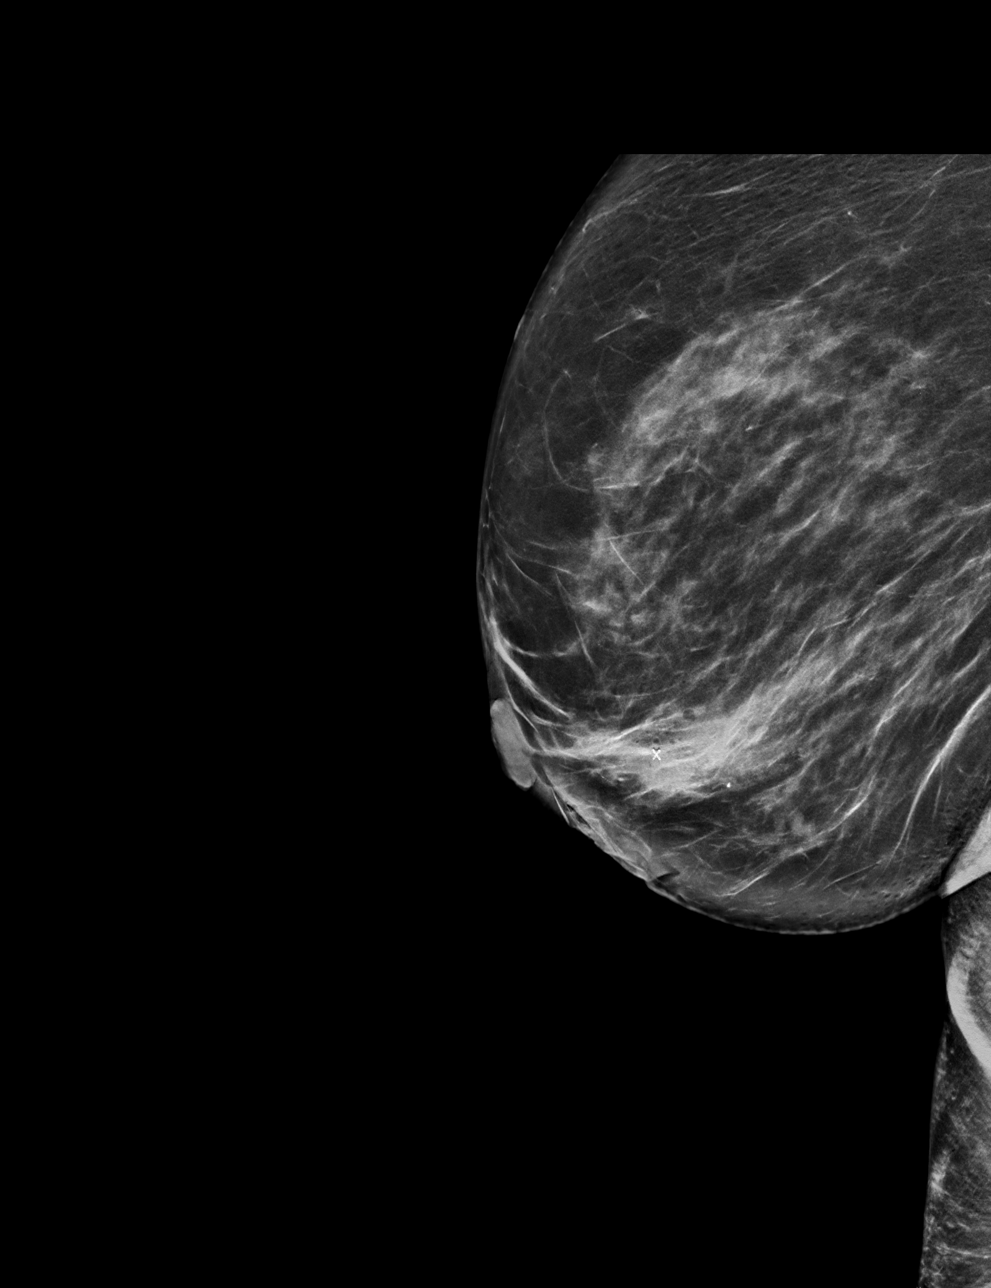

[R CC synth-2D]
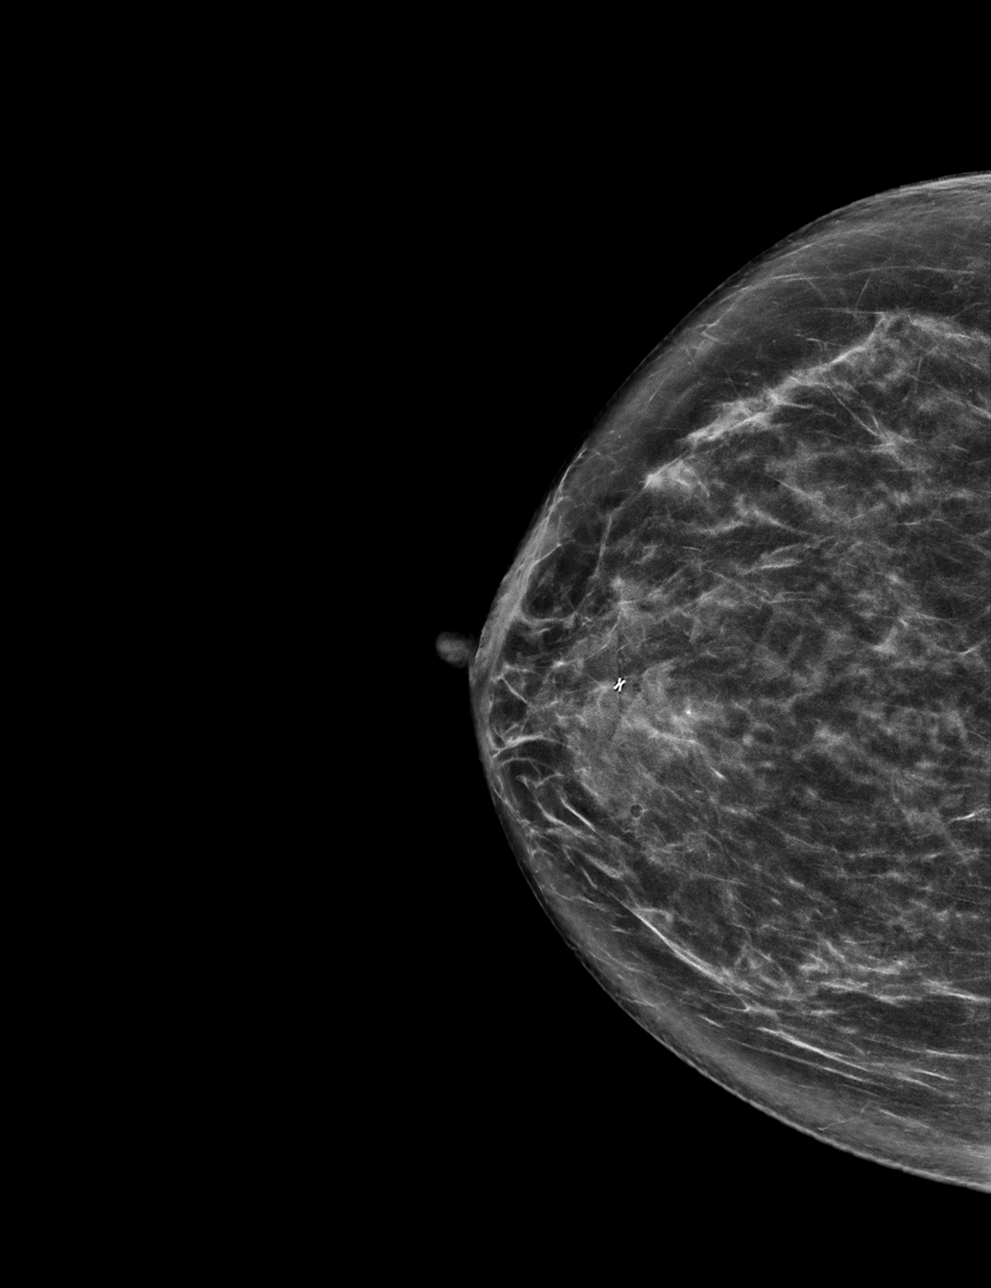

[R CC tomo · tomo slice 33/65.0]
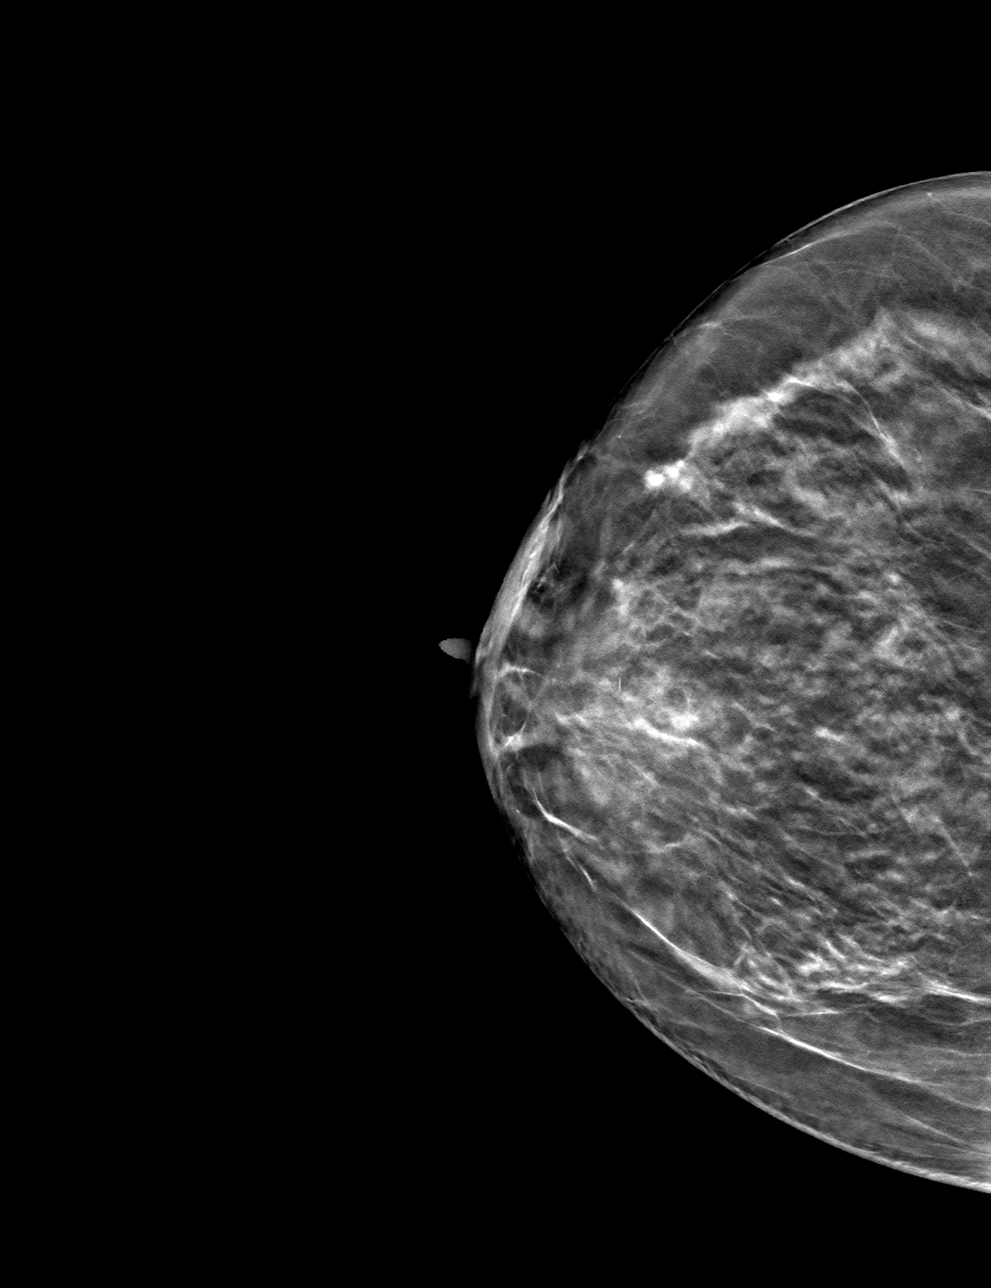

[R LM tomo · tomo slice 37/72.0]
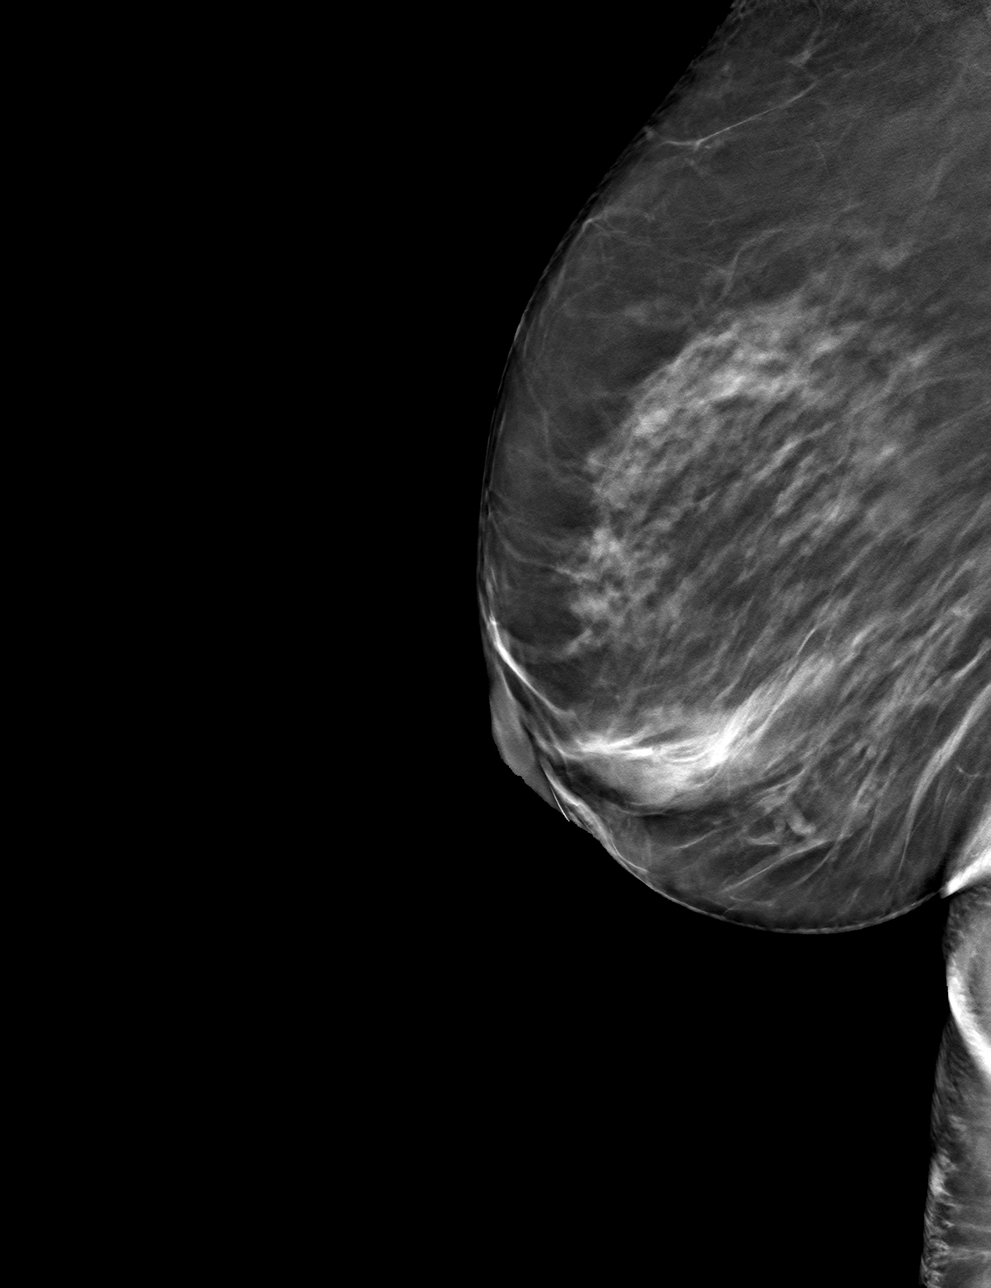

[4 of 12 positions shown; findings below may reference images not displayed]

FINDINGS: 3D Mammographic images were obtained following stereotactic guided
biopsy of right breast calcifications. The biopsy marking clip is in
expected position at the site of biopsy.
IMPRESSION: Appropriate positioning of the X shaped biopsy marking clip at the
site of biopsy in the anterior inferior right breast.

Final Assessment: Post Procedure Mammograms for Marker Placement

## 2021-08-20 IMAGING — MG MM BREAST BX W/ LOC DEV 1ST LESION IMAGE BX SPEC STEREO GUIDE*R*
8 of 12 series · 8 of 28 positions shown · non-contrast
Comparison: Previous exams.
COMPARISON: Previous exams.

Addendum:
CLINICAL DATA: Patient with indeterminate right breast
calcifications.

EXAM:
RIGHT BREAST STEREOTACTIC CORE NEEDLE BIOPSY

[R (1 of 7)]
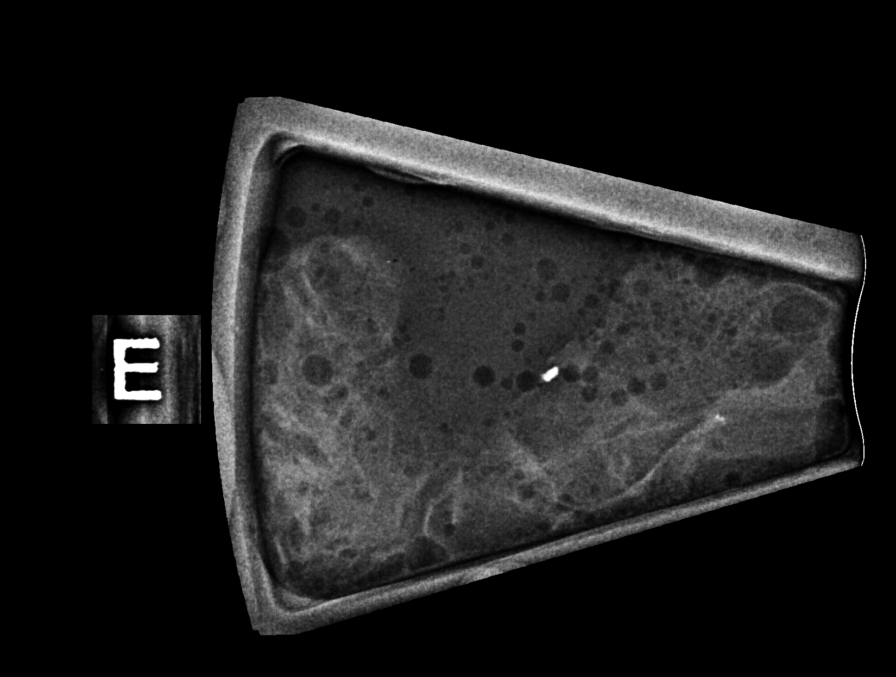

[R (2 of 7)]
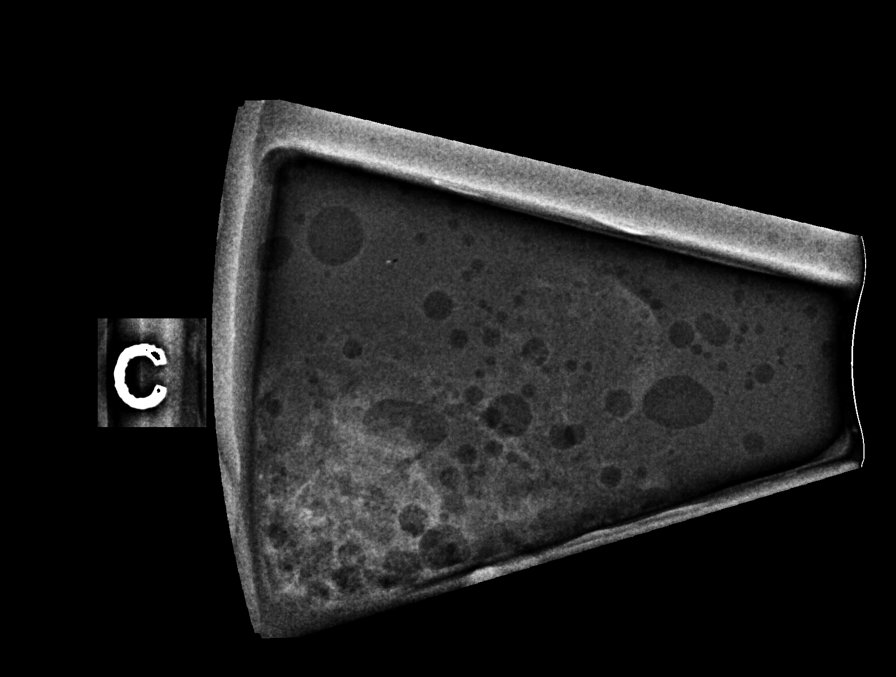

[R (3 of 7)]
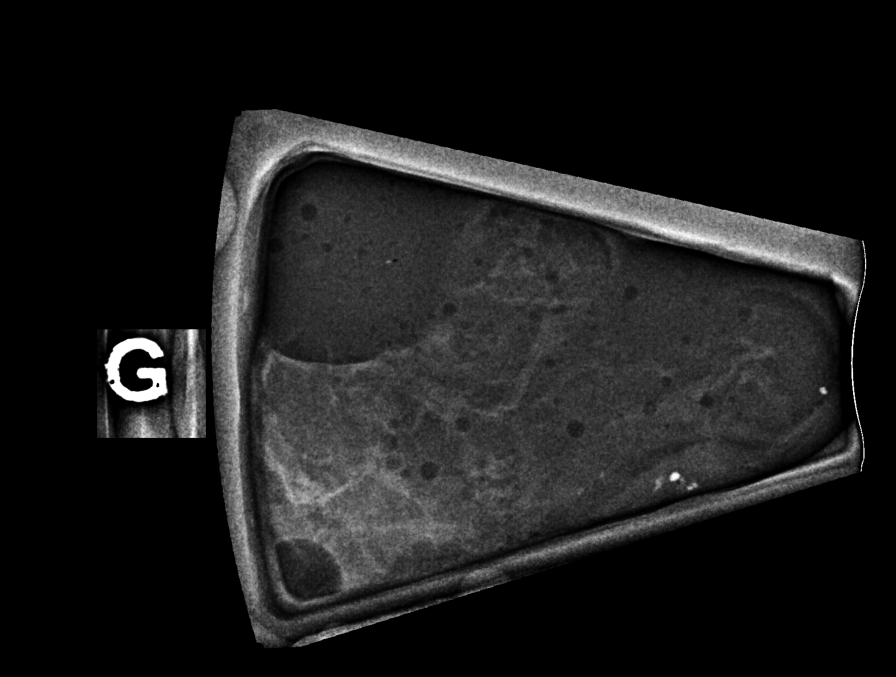

[R (4 of 7)]
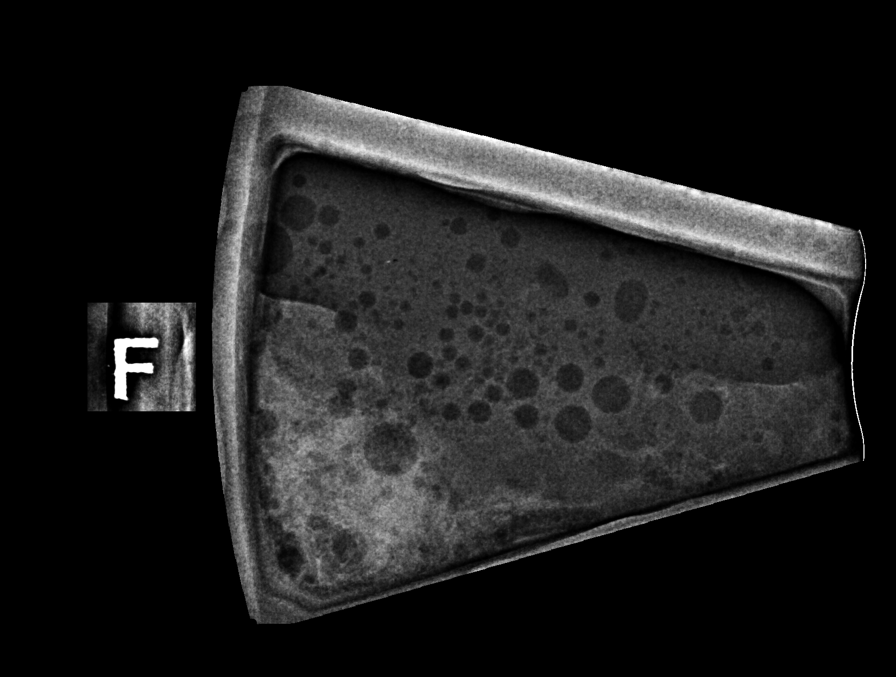

[R (5 of 7)]
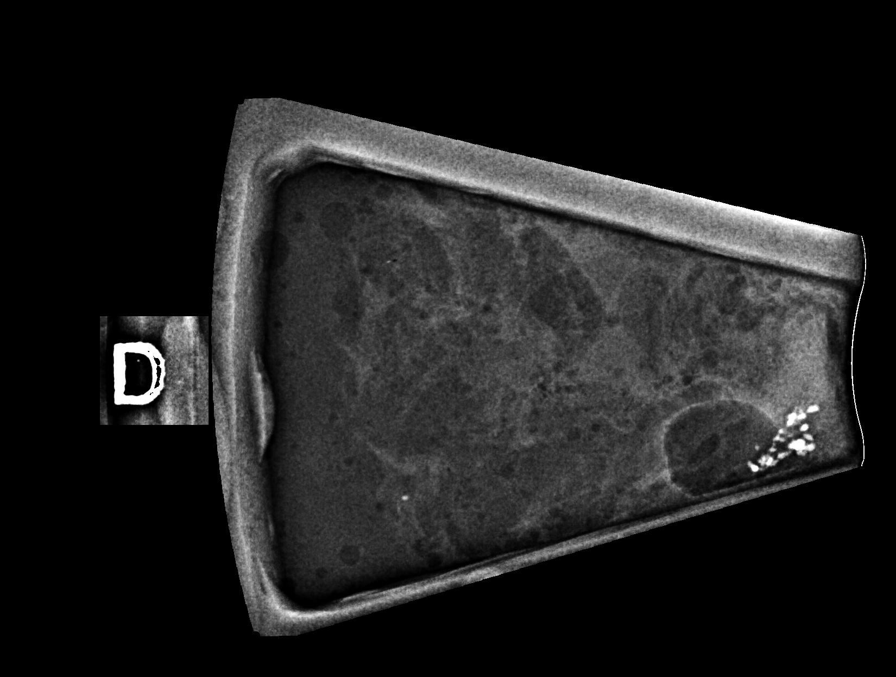

[R (6 of 7)]
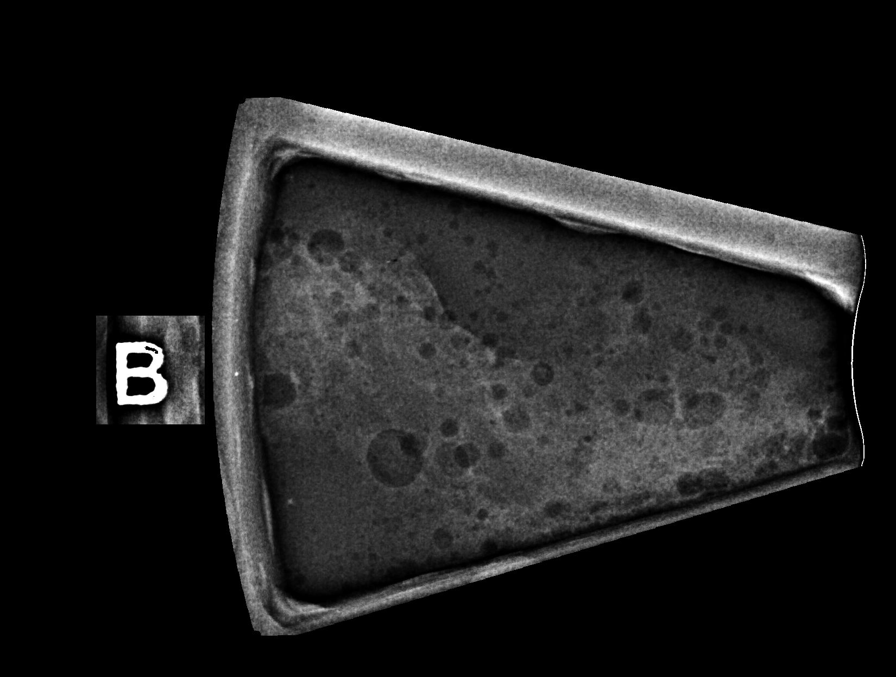

[R (7 of 7)]
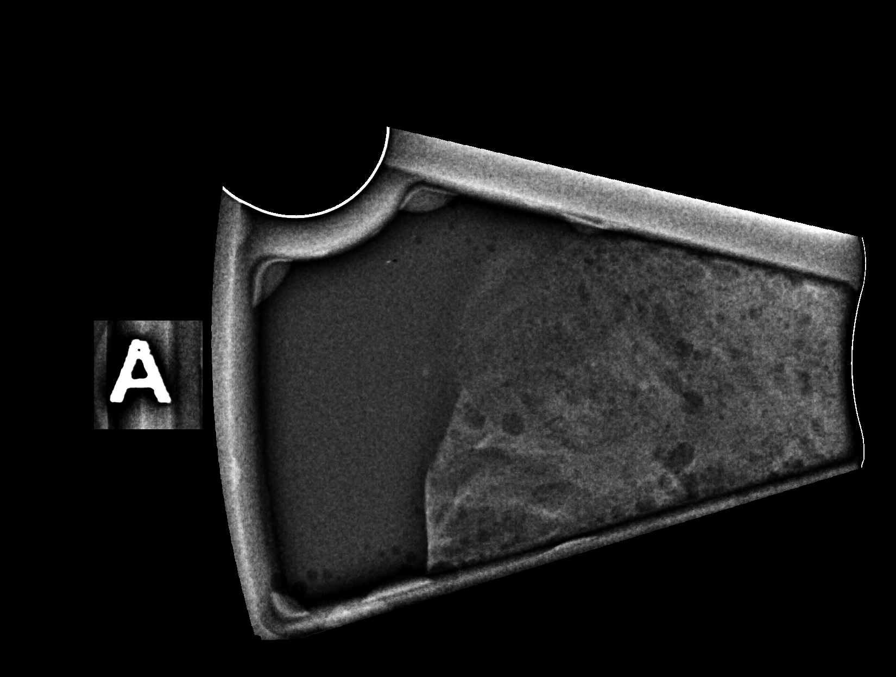

[R LM]
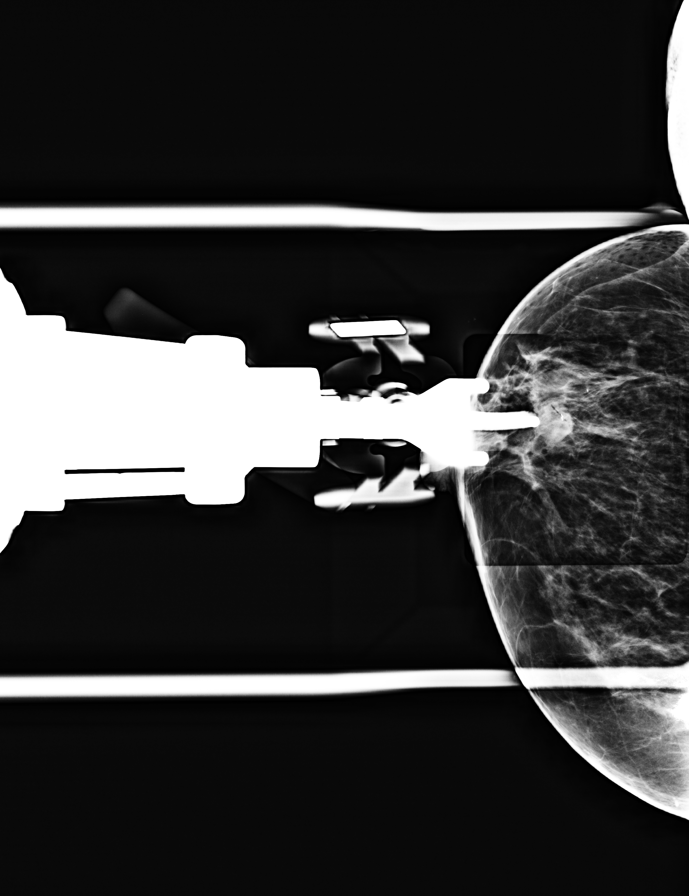

[8 of 28 positions shown; findings below may reference images not displayed]



Using sterile technique and 1% Lidocaine as local anesthetic, under
stereotactic guidance, a 9 gauge vacuum assisted device was used to
perform core needle biopsy of calcifications in the retroareolar
right breast using a lateral approach. Specimen radiograph was
performed showing calcification. Specimens with calcifications are
identified for pathology.

Lesion quadrant: Lower outer quadrant

At the conclusion of the procedure, X shaped tissue marker clip was
deployed into the biopsy cavity. Follow-up 2-view mammogram was
performed and dictated separately.
IMPRESSION: Stereotactic-guided biopsy of right breast calcifications. No
apparent complications.

ADDENDUM:
Pathology revealed INTRADUCTAL PAPILLOMA WITH DUCTAL CARCINOMA IN
SITU of the RIGHT breast, retroareolar, (x clip). Calcifications are
present within the lesion. This was found to be concordant by Dr.
KAMBA.

Pathology results were discussed with the patient by telephone by
KAMBA, RN Nurse Navigator. The patient reported doing well
after the biopsy with tenderness and bruising at the site. Post
biopsy instructions and care were reviewed and questions were
answered. The patient was encouraged to call The [REDACTED] of

Surgical consultation has been arranged with Dr. KAMBA at
[REDACTED] on [DATE].

Pathology results reported by KAMBA, RN on [DATE].



Using sterile technique and 1% Lidocaine as local anesthetic, under
stereotactic guidance, a 9 gauge vacuum assisted device was used to
perform core needle biopsy of calcifications in the retroareolar
right breast using a lateral approach. Specimen radiograph was
performed showing calcification. Specimens with calcifications are
identified for pathology.

Lesion quadrant: Lower outer quadrant

At the conclusion of the procedure, X shaped tissue marker clip was
deployed into the biopsy cavity. Follow-up 2-view mammogram was
performed and dictated separately.
IMPRESSION: Stereotactic-guided biopsy of right breast calcifications. No
apparent complications.

## 2021-08-27 ENCOUNTER — Ambulatory Visit: Payer: Self-pay | Admitting: Surgery

## 2021-08-27 ENCOUNTER — Telehealth: Payer: Self-pay

## 2021-08-27 DIAGNOSIS — D0511 Intraductal carcinoma in situ of right breast: Secondary | ICD-10-CM

## 2021-08-27 NOTE — Telephone Encounter (Signed)
Primary Cardiologist:Robert Agustin Cree, MD  Chart reviewed as part of pre-operative protocol coverage. Because of Kimberly Gay's past medical history and time since last visit, he/she will require a follow-up visit in order to better assess preoperative cardiovascular risk.  Pre-op covering staff: - Please schedule appointment and call patient to inform them. - Please contact requesting surgeon's office via preferred method (i.e, phone, fax) to inform them of need for appointment prior to surgery.  If applicable, this message will also be routed to pharmacy pool and/or primary cardiologist for input on holding anticoagulant/antiplatelet agent as requested below so that this information is available at time of patient's appointment.   Deberah Pelton, NP  08/27/2021, 3:59 PM

## 2021-08-27 NOTE — Telephone Encounter (Signed)
° °   [facility] Pre-operative Risk Assessment    Request for surgical clearance:  What type of surgery is being performed? Breast Lumpectomy  When is this surgery scheduled?  TBD  What type of clearance is required (medical clearance vs. Pharmacy clearance to hold med vs. Both)?  Both  Are there any medications that need to be held prior to surgery and how long?Not specified however, patient is on Aspirin 80m   Practice name and name of physician performing surgery? [facility]    What is your office phone number: [phone]   7.   What is your office fax number: [phone] 8.   Anesthesia type (None, local, MAC, general) ? General Anesthesia  Please fax the status of this request to MCarlene Coriaat [place]1/20/2023, 3:38 PM  _________________________________________________________________   (provider comments below)

## 2021-08-30 ENCOUNTER — Other Ambulatory Visit: Payer: Self-pay | Admitting: Surgery

## 2021-08-30 DIAGNOSIS — D0511 Intraductal carcinoma in situ of right breast: Secondary | ICD-10-CM

## 2021-08-30 NOTE — Telephone Encounter (Signed)
I have reviewed schedules at both [place] and [facility] location. I was unable to find an available time slot for an appt for pre op clearance. Pt is having breast lumpectomy. I will send a message as well to scheduling [place]/[facility] location as well as Dr. Wendy Poet nurse, in hopes they may be able to assist in making an appt for the pt. I will update the requesting off the pt needs appt with the cardiologist for clearance.

## 2021-08-30 NOTE — Telephone Encounter (Signed)
Pt has appt 09/08/21 with Dr. Agustin Cree for pre op clearance. I will forward notes to MD for upcoming appt. Will send FYI to requesting office pt has appt 09/08/21.

## 2021-08-31 ENCOUNTER — Telehealth: Payer: Self-pay | Admitting: Hematology and Oncology

## 2021-08-31 NOTE — Telephone Encounter (Signed)
Scheduled appt 1/23 referral. Pt is aware of appt date and time. Pt is aware to arrive 15 mins prior to appt time.

## 2021-09-01 DIAGNOSIS — I7789 Other specified disorders of arteries and arterioles: Secondary | ICD-10-CM | POA: Insufficient documentation

## 2021-09-01 DIAGNOSIS — I499 Cardiac arrhythmia, unspecified: Secondary | ICD-10-CM | POA: Insufficient documentation

## 2021-09-01 DIAGNOSIS — I639 Cerebral infarction, unspecified: Secondary | ICD-10-CM | POA: Insufficient documentation

## 2021-09-01 NOTE — Progress Notes (Signed)
New Breast Cancer Diagnosis:Right Breast Central portion  Did patient present with symptoms (if so, please note symptoms) or screening mammography?:Screening Mass    Location and Extent of disease :right breast. Located at in the retro-areolar position, measured  1.2 cm in greatest dimension. Adenopathy no.  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 Brantley Stage -Right Breast lumpectomy with radioactive seed localization 09/28/2021   Medical oncologist, treatment if any:   Dr. Lindi Adie 09/23/2021   Family History of Breast/Ovarian/Prostate Cancer: Paternal first cousin had ovarian. Maternal cousin with breast cancer.   Lymphedema issues, if any: No     Pain issues, if any: Notes some bruising at the biopsy site.  Denies pain or tenderness to the areas.    SAFETY ISSUES: Prior radiation? No Pacemaker/ICD? No Possible current pregnancy? Hysterectomy Is the patient on methotrexate? No  Current Complaints / other details:

## 2021-09-02 ENCOUNTER — Ambulatory Visit
Admission: RE | Admit: 2021-09-02 | Discharge: 2021-09-02 | Disposition: A | Discharge status: Home / Self Care | Payer: Medicare PPO | Source: Ambulatory Visit | Attending: Radiation Oncology | Admitting: Radiation Oncology

## 2021-09-02 ENCOUNTER — Encounter: Payer: Self-pay | Admitting: Radiation Oncology

## 2021-09-02 ENCOUNTER — Other Ambulatory Visit: Payer: Self-pay

## 2021-09-02 ENCOUNTER — Telehealth: Payer: Self-pay | Admitting: *Deleted

## 2021-09-02 VITALS — BP 133/69 | HR 58 | Temp 97.9°F | Resp 20 | Ht 65.0 in | Wt 180.4 lb

## 2021-09-02 DIAGNOSIS — D0511 Intraductal carcinoma in situ of right breast: Secondary | ICD-10-CM | POA: Insufficient documentation

## 2021-09-02 DIAGNOSIS — I480 Paroxysmal atrial fibrillation: Secondary | ICD-10-CM | POA: Insufficient documentation

## 2021-09-02 DIAGNOSIS — Z7982 Long term (current) use of aspirin: Secondary | ICD-10-CM | POA: Diagnosis not present

## 2021-09-02 DIAGNOSIS — I10 Essential (primary) hypertension: Secondary | ICD-10-CM | POA: Insufficient documentation

## 2021-09-02 DIAGNOSIS — N289 Disorder of kidney and ureter, unspecified: Secondary | ICD-10-CM

## 2021-09-02 DIAGNOSIS — K219 Gastro-esophageal reflux disease without esophagitis: Secondary | ICD-10-CM | POA: Insufficient documentation

## 2021-09-02 DIAGNOSIS — Z8673 Personal history of transient ischemic attack (TIA), and cerebral infarction without residual deficits: Secondary | ICD-10-CM | POA: Diagnosis not present

## 2021-09-02 DIAGNOSIS — Z79899 Other long term (current) drug therapy: Secondary | ICD-10-CM | POA: Insufficient documentation

## 2021-09-02 DIAGNOSIS — Z170 Estrogen receptor positive status [ER+]: Secondary | ICD-10-CM | POA: Insufficient documentation

## 2021-09-02 NOTE — Progress Notes (Signed)
Radiation Oncology         ([phone]) [phone] ________________________________  Name: Kimberly Gay        MRN: [id number]  Date of Service: 09/02/2021 DOB: 1941-04-08  [id number], Bill Salinas, MD  Erroll Luna, MD     REFERRING PHYSICIAN: Erroll Luna, MD   DIAGNOSIS: The primary encounter diagnosis was Disorder of kidney and ureter, unspecified. A diagnosis of Ductal carcinoma in situ (DCIS) of right breast was also pertinent to this visit.   HISTORY OF PRESENT ILLNESS: Kimberly Gay is a 81 y.o. female seen in the multidisciplinary breast clinic for a new diagnosis of right breast cancer. The patient was noted to have a screening detected mass in the right breast in the retroareolar position. By diagnostic mammogram a 1.2 cm area of calcifications in linear orientation were seen in the retroareolar breast.  A biopsy was performed on 08/20/21 that showed an ER/PR positive intraductal pailloma with DCIS. No grade was given. She has met with Dr. Brantley Stage and plans to proceed with right breast lumpectomy on 09/28/21. She's seen to discuss treatment recommendations of her cancer.  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 Dwaine Gale.  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[place])    Brain aneurysm 04/03/2019   Breast cancer ([place]) 08/20/2021   Cancer ([place]) 1994   colon-resection   Cerebral aneurysm 04/03/2019   Cerebral aneurysm, nonruptured 2010   not treated due to location-monitor yearly   Dyspnea on exertion 04/03/2019   Dysrhythmia    afib    GERD (gastroesophageal reflux disease)    infrequent   Guaiac + stool 02/26/2020   Hypertension    well controlled   Incontinence of urine    Joint pain, knee    Palpitations 04/03/2019   Paroxysmal atrial fibrillation (HCC) CHA2DS2-VASc equals 4, not anticoagulated so far because of history of intracranial bleed and  aneurysm 10/28/2019   Precordial chest pain 04/03/2019   Stroke [facility])    due to aneurysm rupture in 2010    Urgency of urination        PAST SURGICAL HISTORY: Past Surgical History:  Procedure Laterality Date   ABDOMINAL HYSTERECTOMY     ANEURYSM COILING  2010   cerebral coiling-no deficits   BREAST EXCISIONAL BIOPSY Left 04/05/1994   CHOLECYSTECTOMY     CYSTOSCOPY  09/03/2012   Procedure: CYSTOSCOPY;  Surgeon: Ailene Rud, MD;  Location: [place];  Service: Urology;  Laterality: N/A;   FLEXIBLE SIGMOIDOSCOPY N/A 11/24/2014   Procedure: FLEXIBLE SIGMOIDOSCOPY;  Surgeon: Garlan Fair, MD;  Location: [facility];  Service: Endoscopy;  Laterality: N/A;   IR EMBO ART  VEN HEMORR LYMPH EXTRAV  INC GUIDE ROADMAPPING  11/18/2020   IR RADIOLOGIST EVAL & MGMT  09/23/2020   IR RADIOLOGIST EVAL & MGMT  12/15/2020   IR RADIOLOGIST EVAL & MGMT  02/23/2021   IR RENAL SUPRASEL UNI S&I MOD SED  11/18/2020   IR US GUIDE VASC ACCESS RIGHT  11/18/2020   LAMINECTOMY     LYSIS OF ADHESION  2000's   with cholecystectomy procedure   PILONIDAL CYST EXCISION     PUBOVAGINAL SLING  09/03/2012   Procedure: Gaynelle Arabian;  Surgeon: Ailene Rud, MD;  Location: [facility];  Service: Urology;  Laterality: N/A;  LYNX SLING    RADIOLOGY  WITH ANESTHESIA Right 11/18/2020   Procedure: IR WITH ANESTHESIA RENAL CRYOABLATION;  Surgeon: Arne Cleveland, MD;  Location: [facility];  Service: Radiology;  Laterality: Right;     FAMILY HISTORY:  Family History  Problem Relation Age of Onset   Seizures Mother    Heart attack Father    Breast cancer Cousin    Ovarian cancer Cousin    Diabetes Other      SOCIAL HISTORY:  reports that she has never smoked. She has never used smokeless tobacco. She reports that she does not drink alcohol and does not use drugs. The patient is widowed and lives in [place].   ALLERGIES: Amoxicillin, Diphenhydramine hcl,  Meperidine and related, and Wasp venom   MEDICATIONS:  Current Outpatient Medications  Medication Sig Dispense Refill   acetaminophen (TYLENOL) 500 MG tablet Take 500-1,000 mg by mouth every 6 (six) hours as needed for mild pain.     aspirin EC 81 MG tablet Take 1 tablet (81 mg total) by mouth daily. Swallow whole. 90 tablet 3   cetirizine (ZYRTEC) 10 MG tablet Take 10 mg by mouth daily as needed for allergies.     cholecalciferol (VITAMIN D) 1000 UNITS tablet Take 2,000 Units by mouth daily.      EPINEPHrine (EPIPEN) 0.3 mg/0.3 mL SOAJ injection Inject 0.3 mLs (0.3 mg total) into the muscle once. (Patient taking differently: Inject 0.3 mg into the muscle as needed for anaphylaxis.) 1 Device 0   furosemide (LASIX) 20 MG tablet Take 20 mg by mouth daily.      gabapentin (NEURONTIN) 100 MG capsule Take by mouth.     hydrocortisone 2.5 % cream Apply 2.5 application topically daily as needed (hemorrhoids).     metoprolol succinate (TOPROL-XL) 100 MG 24 hr tablet TAKE 1 TABLET BY MOUTH DAILY. TAKE WITH OR IMMEDIATELY FOLLOWING A MEAL. (Patient taking differently: Take 50 mg by mouth.) 90 tablet 1   nitroGLYCERIN (NITROSTAT) 0.4 MG SL tablet Place 1 tablet (0.4 mg total) under the tongue every 5 (five) minutes as needed for chest pain. 25 tablet 11   pantoprazole (PROTONIX) 40 MG tablet Take 40 mg by mouth 2 (two) times daily.     phenylephrine-shark liver oil-mineral oil-petrolatum (PREPARATION H) 0.25-3-14-71.9 % rectal ointment Place 1 application rectally daily.     Polyethyl Glycol-Propyl Glycol 0.4-0.3 % SOLN Place 1 drop into both eyes in the morning and at bedtime.     No current facility-administered medications for this encounter.     REVIEW OF SYSTEMS: On review of systems, the patient reports that she is doing okay. She is curious about her watchman implant for her atrial fibrillation. She reports she has had some pain a few months ago in the lower part of the right flank area, and in  the last month, she feels like this progressed and is now so painful that she cannot wear a bra comfortably. She noted some discomfort following her breast biopsy. No other complaints are verbalized.      PHYSICAL EXAM:  Wt Readings from Last 3 Encounters:  09/02/21 180 lb 6.4 oz (81.8 kg)  12/30/20 177 lb (80.3 kg)  11/18/20 174 lb (78.9 kg)   Temp Readings from Last 3 Encounters:  09/02/21 97.9 F (36.6 C)  11/19/20 97.7 F (36.5 C) (Oral)  11/11/20 98.6 F (37 C) (Oral)   BP Readings from Last 3 Encounters:  09/02/21 133/69  02/23/21 (!) 150/71  12/30/20 138/72   Pulse Readings from Last 3 Encounters:  09/02/21 (!) 58  02/23/21 63  12/30/20 (!) 54    In general this is a well appearing caucasian female in no acute distress. She's alert and oriented x4 and appropriate throughout the examination. Cardiopulmonary assessment is negative for acute distress and she exhibits normal effort. Bilateral breast exam is deferred. She has firm fullness just to the right of the spine along the mid back along what seems to be the latissimus musculature. The firmness extends about 10 cm vertically.   ECOG = 1  0 - Asymptomatic (Fully active, able to carry on all predisease activities without restriction)  1 - Symptomatic but completely ambulatory (Restricted in physically strenuous activity but ambulatory and able to carry out work of a light or sedentary nature. For example, light housework, office work)  2 - Symptomatic, <50% in bed during the day (Ambulatory and capable of all self care but unable to carry out any work activities. Up and about more than 50% of waking hours)  3 - Symptomatic, >50% in bed, but not bedbound (Capable of only limited self-care, confined to bed or chair 50% or more of waking hours)  4 - Bedbound (Completely disabled. Cannot carry on any self-care. Totally confined to bed or chair)  5 - Death   Eustace Pen MM, Creech RH, Tormey DC, et al. [id number]). "Toxicity and  response criteria of the [facility]". [place] Oncol. 5 (6): 649-55    LABORATORY DATA:  Lab Results  Component Value Date   WBC 11.8 (H) 11/19/2020   HGB 12.2 11/19/2020   HCT 40.2 11/19/2020   MCV 88.5 11/19/2020   PLT 202 11/19/2020   Lab Results  Component Value Date   NA 137 11/19/2020   K 4.0 11/19/2020   CL 107 11/19/2020   CO2 23 11/19/2020   Lab Results  Component Value Date   ALT 16 01/03/2009   AST 24 01/03/2009   ALKPHOS 50 01/03/2009   BILITOT 1.4 (H) 01/03/2009      RADIOGRAPHY: MM Digital Diagnostic Unilat R  Result Date: 08/12/2021 CLINICAL DATA:  81 year old female for further evaluation of RIGHT breast calcifications identified on screening mammogram. EXAM: DIGITAL DIAGNOSTIC UNILATERAL RIGHT MAMMOGRAM TECHNIQUE: Right digital diagnostic mammography was performed. Mammographic images were processed with CAD. COMPARISON:  Previous exam(s). ACR Breast Density Category b: There are scattered areas of fibroglandular density. FINDINGS: Full field and magnification views of the RIGHT breast demonstrate a group of slightly heterogeneous RETROAREOLAR RIGHT breast calcifications arranged in a linear orientation, spanning a distance of up to 1.2 cm. IMPRESSION: Indeterminate 1.2 cm group of RETROAREOLAR RIGHT breast calcifications - tissue sampling is recommended. RECOMMENDATION: 3D/stereotactic guided RIGHT breast biopsy, which will be scheduled. I have discussed the findings and recommendations with the patient. If applicable, a reminder letter will be sent to the patient regarding the next appointment. BI-RADS CATEGORY  4: Suspicious. Electronically Signed   By: Margarette Canada M.D.   On: 08/12/2021 12:10  MM CLIP PLACEMENT RIGHT  Result Date: 08/20/2021 CLINICAL DATA:  Patient with indeterminate right breast calcifications. EXAM: 3D DIAGNOSTIC RIGHT MAMMOGRAM POST STEREOTACTIC BIOPSY COMPARISON:  Previous exam(s). FINDINGS: 3D Mammographic images  were obtained following stereotactic guided biopsy of right breast calcifications. The biopsy marking clip is in expected position at the site of biopsy. IMPRESSION: Appropriate positioning of the X shaped biopsy marking clip at the site of biopsy in the anterior inferior right breast. Final Assessment: Post Procedure Mammograms for Marker Placement Electronically Signed   By:  Lovey Newcomer M.D.   On: 08/20/2021 11:43  MM RT BREAST BX W LOC DEV 1ST LESION IMAGE BX SPEC STEREO GUIDE  Addendum Date: 08/27/2021   ADDENDUM REPORT: 08/27/2021 19:04 ADDENDUM: Pathology revealed INTRADUCTAL PAPILLOMA WITH DUCTAL CARCINOMA IN SITU of the RIGHT breast, retroareolar, (x clip). Calcifications are present within the lesion. This was found to be concordant by Dr. Lovey Newcomer. Pathology results were discussed with the patient by telephone by Stacie Acres, RN Nurse Navigator. The patient reported doing well after the biopsy with tenderness and bruising at the site. Post biopsy instructions and care were reviewed and questions were answered. The patient was encouraged to call [facility] for any additional concerns. Surgical consultation has been arranged with Dr. Erroll Luna at [facility] on August 27, 2021. Pathology results reported by Terie Purser, RN on 08/25/2021. Electronically Signed   By: Lovey Newcomer M.D.   On: 08/27/2021 19:04   Result Date: 08/27/2021 CLINICAL DATA:  Patient with indeterminate right breast calcifications. EXAM: RIGHT BREAST STEREOTACTIC CORE NEEDLE BIOPSY COMPARISON:  Previous exams. FINDINGS: The patient and I discussed the procedure of stereotactic-guided biopsy including benefits and alternatives. We discussed the high likelihood of a successful procedure. We discussed the risks of the procedure including infection, bleeding, tissue injury, clip migration, and inadequate sampling. Informed written consent was given. The usual time out protocol was  performed immediately prior to the procedure. Using sterile technique and 1% Lidocaine as local anesthetic, under stereotactic guidance, a 9 gauge vacuum assisted device was used to perform core needle biopsy of calcifications in the retroareolar right breast using a lateral approach. Specimen radiograph was performed showing calcification. Specimens with calcifications are identified for pathology. Lesion quadrant: Lower outer quadrant At the conclusion of the procedure, X shaped tissue marker clip was deployed into the biopsy cavity. Follow-up 2-view mammogram was performed and dictated separately. IMPRESSION: Stereotactic-guided biopsy of right breast calcifications. No apparent complications. Electronically Signed: By: Lovey Newcomer M.D. On: 08/20/2021 11:40      IMPRESSION/PLAN: 1. ER/PR positive DCIS, grade unknown of the right central breast. Dr. Lisbeth Renshaw discusses the pathology findings and reviews the nature of early stage right breast disease. She is planning to proceed with breast conserving surgery. Dr. Lisbeth Renshaw discusses the rationale for external radiotherapy to the breast  to reduce risks of local recurrence followed by antiestrogen therapy. He also discusses cases in which radiation may be optional for favorable cases based on final pathology. We discussed the risks, benefits, short, and long term effects of radiotherapy, as well as the curative intent, and the patient is interested in proceeding. Dr. Lisbeth Renshaw discusses the delivery and logistics of radiotherapy and anticipates a course of 4 weeks of radiotherapy to the right breast. We will see her back a few weeks after surgery to discuss the simulation process and anticipate we starting radiotherapy about 4-6 weeks after surgery if she were to proceed.  2. 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 I reached out to Dr. Dwaine Gale. We will coordinate another MRI of the abdomen with and without contrast and coordinate follow up  in his clinic.  In a visit lasting 60 minutes, greater than 50% of the time was spent face to face reviewing her case, as well as in preparation of, discussing, and coordinating the patient's care.  The above documentation reflects my direct findings during this shared patient visit. Please see the separate note by Dr.  Moody on this date for the remainder of the patient's plan of care.    Carola Rhine, [facility]    **Disclaimer: This note was dictated with voice recognition software. Similar sounding words can inadvertently be transcribed and this note may contain transcription errors which may not have been corrected upon publication of note.**

## 2021-09-02 NOTE — Telephone Encounter (Signed)
CALLED PATIENT TO INFORM OF MRI FOR 09-07-21- ARRIVAL TIME- 4:30 PM, PT. TO BE NPO- 4 HRS. PRIOR TO TEST, TEST TO BE @ [facility], LVM FOR A RETURN CALL

## 2021-09-07 ENCOUNTER — Other Ambulatory Visit: Payer: Self-pay

## 2021-09-07 ENCOUNTER — Ambulatory Visit (HOSPITAL_COMMUNITY)
Admission: RE | Admit: 2021-09-07 | Discharge: 2021-09-07 | Disposition: A | Discharge status: Home / Self Care | Payer: Medicare PPO | Source: Ambulatory Visit | Attending: Radiation Oncology | Admitting: Radiation Oncology

## 2021-09-07 DIAGNOSIS — N289 Disorder of kidney and ureter, unspecified: Secondary | ICD-10-CM | POA: Insufficient documentation

## 2021-09-07 DIAGNOSIS — M47814 Spondylosis without myelopathy or radiculopathy, thoracic region: Secondary | ICD-10-CM | POA: Diagnosis not present

## 2021-09-07 DIAGNOSIS — C641 Malignant neoplasm of right kidney, except renal pelvis: Secondary | ICD-10-CM | POA: Diagnosis not present

## 2021-09-07 DIAGNOSIS — K838 Other specified diseases of biliary tract: Secondary | ICD-10-CM | POA: Diagnosis not present

## 2021-09-07 DIAGNOSIS — Z87828 Personal history of other (healed) physical injury and trauma: Secondary | ICD-10-CM | POA: Diagnosis not present

## 2021-09-07 IMAGING — MR MR ABDOMEN WO/W CM
18 series · 47 of 48 positions shown · IV contrast (gadavist)
Comparison: Multiple exams, including [DATE]

CLINICAL DATA: Low-grade oncocytic tumor of the right kidney, prior
cryoablation [DATE] with Gel-Foam embolization of the right
inferior segmental renal artery related to postprocedural
perinephric hematoma.

EXAM:
MRI ABDOMEN WITHOUT AND WITH CONTRAST
TECHNIQUE: Multiplanar multisequence MR imaging of the abdomen was performed
both before and after the administration of intravenous contrast.
CONTRAST:  8.5mL GADAVIST GADOBUTROL 1 MMOL/ML IV SOLN

[Series 3: DWI · axial · 6.0mm · 1.49mm/px · z∈[-79,+202]mm · 4 of 80 slices shown (1 of 2)]
[im 1/80]
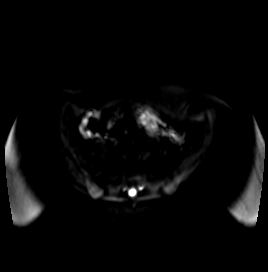
[im 27/80]
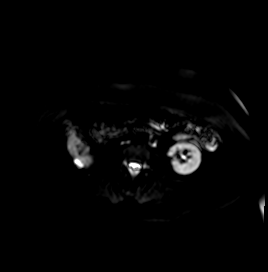
[im 53/80]
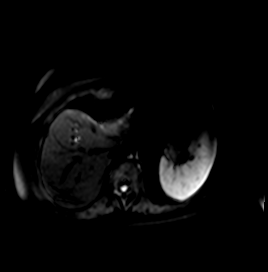
[im 80/80]
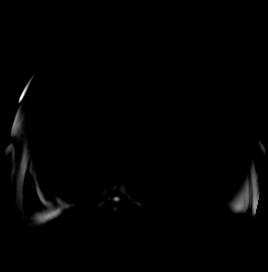

[Series 4: DWI · axial · 6.0mm · 1.49mm/px · z∈[-79,+202]mm · 2 of 40 slices shown (2 of 2)]
[im 1/40]
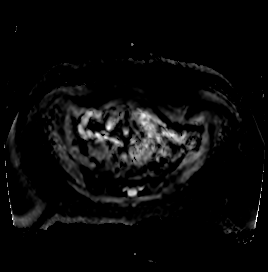
[im 40/40]
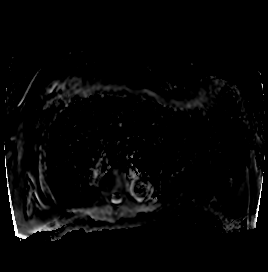

[Series 5: T2 fat-sat · axial · 6.0mm · 1.25mm/px · z∈[-72,+224]mm · 2 of 42 slices shown]
[im 1/42]
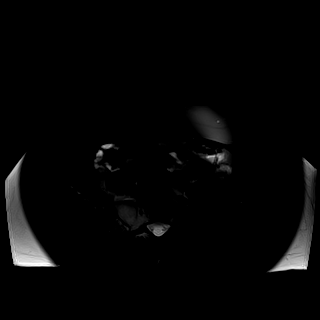
[im 42/42]
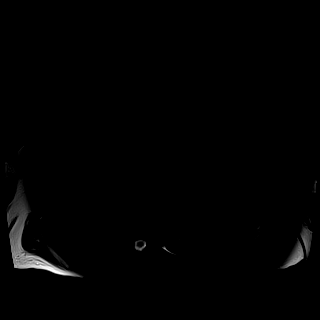

[Series 7: T1 · axial · 3.0mm · 1.25mm/px · z∈[-62,+223]mm · 4 of 96 slices shown (1 of 2)]
[im 1/96]
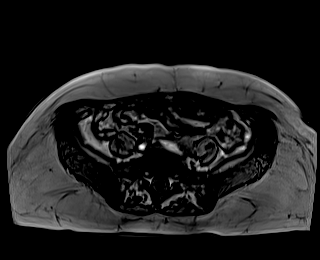
[im 32/96]
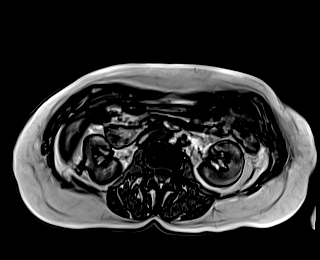
[im 64/96]
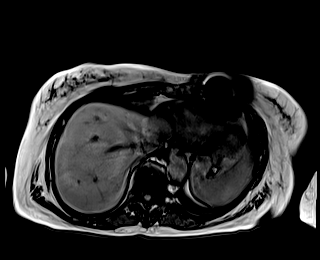
[im 96/96]
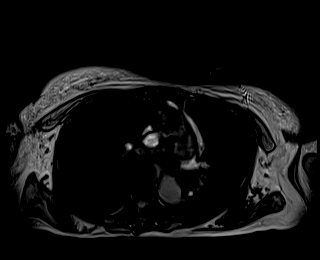

[Series 8: T1 · axial · 3.0mm · 1.25mm/px · z∈[-62,+223]mm · 3 of 96 slices shown (2 of 2)]
[im 1/96]
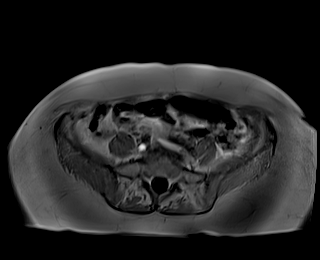
[im 48/96]
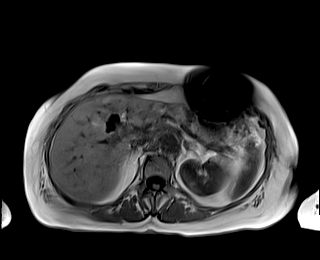
[im 96/96]
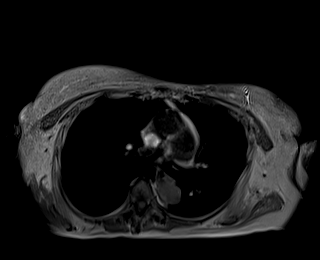

[Series 9: bSSFP · axial · 4.0mm · 0.84mm/px · z∈[-33,+203]mm · 2 of 60 slices shown]
[im 1/60]
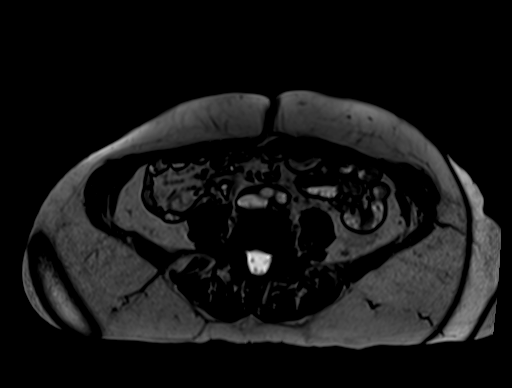
[im 60/60]
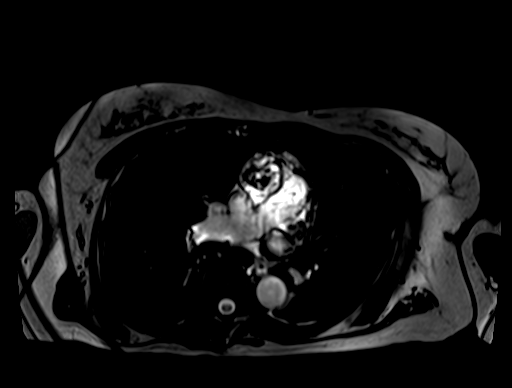

[Series 10: T2 · coronal · 6.0mm · 1.56mm/px · 1 of 29 slices shown (1 of 2)]
[im 1/29]
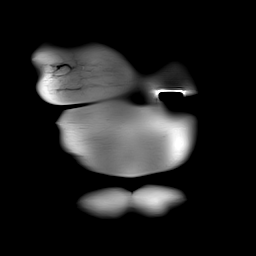

[Series 12: T1 dynamic · axial · 3.0mm · 1.25mm/px · z∈[-41,+244]mm · 3 of 96 slices shown (1 of 10)]
[im 1/96]
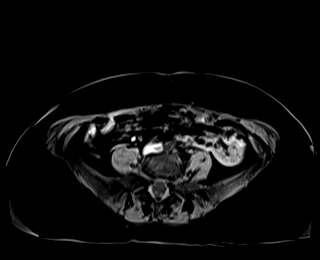
[im 48/96]
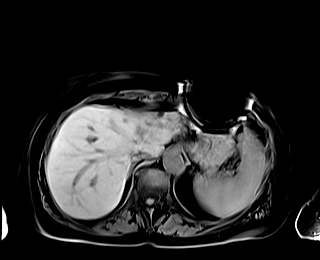
[im 96/96]
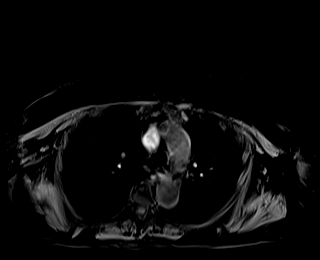

[Series 16: T1 dynamic · axial · 3.0mm · 1.25mm/px · z∈[-41,+244]mm · 3 of 96 slices shown (2 of 10)]
[im 1/96]
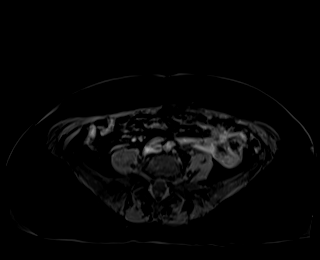
[im 48/96]
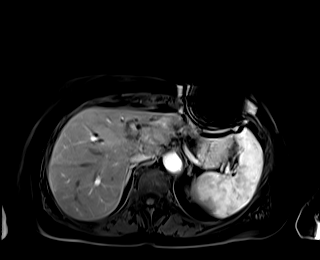
[im 96/96]
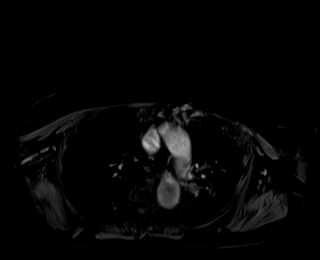

[Series 17: T1 dynamic · axial · 3.0mm · 1.25mm/px · z∈[-41,+244]mm · 3 of 96 slices shown (3 of 10)]
[im 1/96]
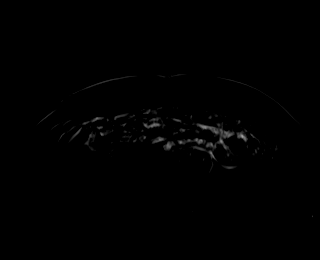
[im 48/96]
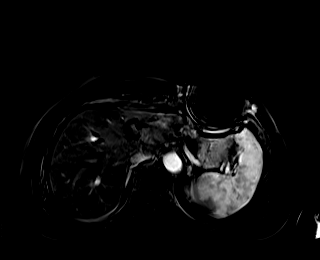
[im 96/96]
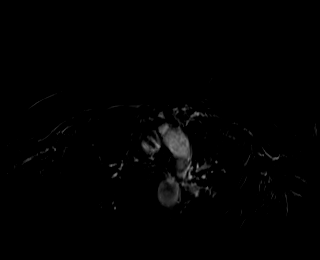

[Series 20: T1 dynamic · axial · 3.0mm · 1.25mm/px · z∈[-41,+244]mm · 3 of 96 slices shown (4 of 10)]
[im 1/96]
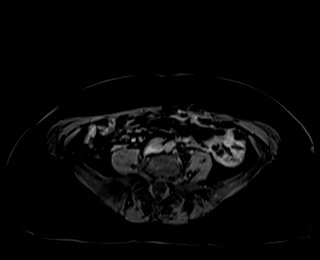
[im 48/96]
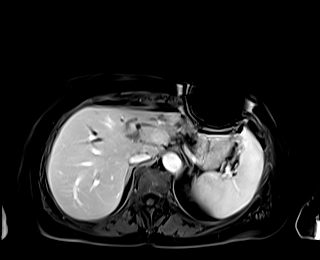
[im 96/96]
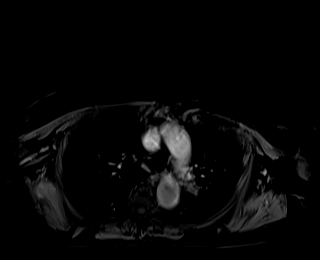

[Series 21: T1 dynamic · axial · 3.0mm · 1.25mm/px · z∈[-41,+244]mm · 3 of 96 slices shown (5 of 10)]
[im 1/96]
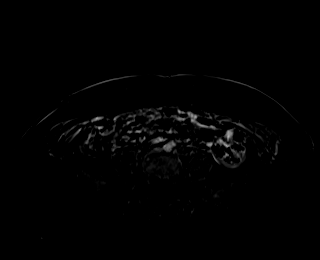
[im 48/96]
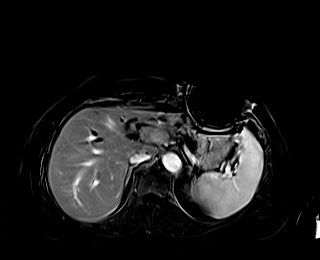
[im 96/96]
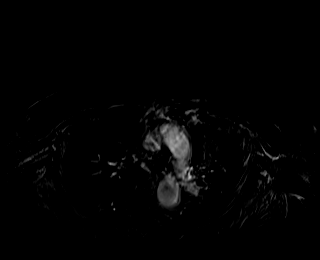

[Series 24: T1 dynamic · axial · 3.0mm · 1.25mm/px · z∈[-41,+244]mm · 3 of 96 slices shown (6 of 10)]
[im 1/96]
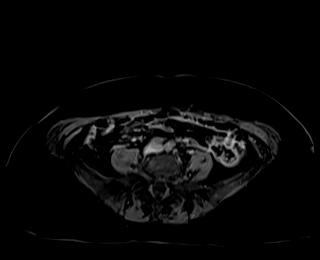
[im 48/96]
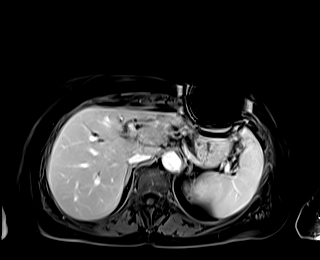
[im 96/96]
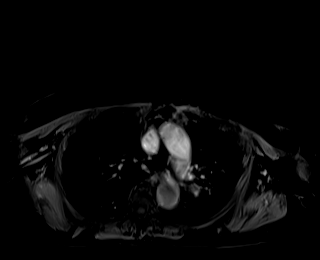

[Series 25: T1 dynamic · axial · 3.0mm · 1.25mm/px · z∈[-41,+244]mm · 3 of 96 slices shown (7 of 10)]
[im 1/96]
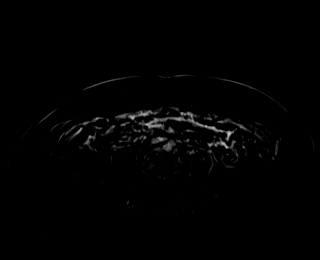
[im 48/96]
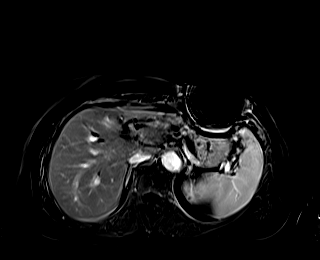
[im 96/96]
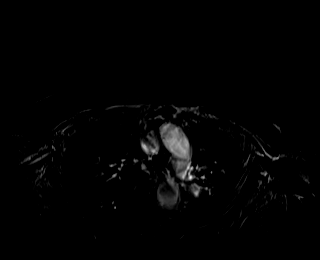

[Series 27: T1 dynamic · coronal · 3.0mm · 1.41mm/px · 2 of 72 slices shown (8 of 10)]
[im 1/72]
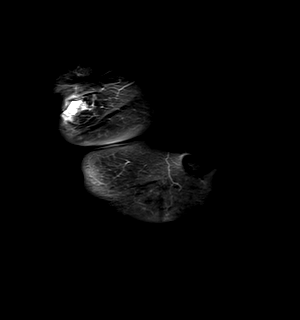
[im 72/72]
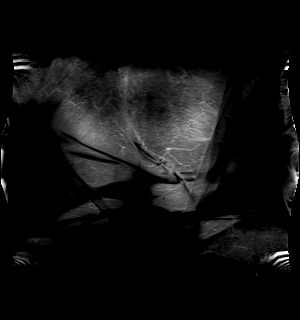

[Series 28: T2 · axial · 6.0mm · 1.56mm/px · 1 of 38 slices shown (2 of 2)]
[im 1/38]
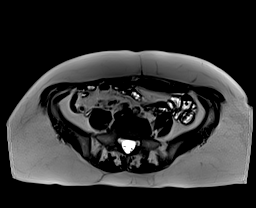

[Series 31: T1 dynamic · axial · 3.0mm · 1.25mm/px · z∈[-41,+244]mm · 3 of 96 slices shown (9 of 10)]
[im 1/96]
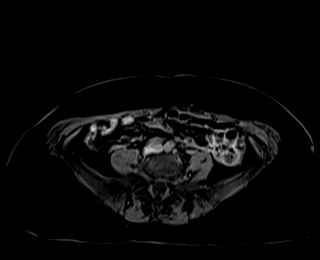
[im 48/96]
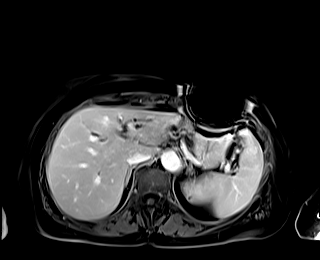
[im 96/96]
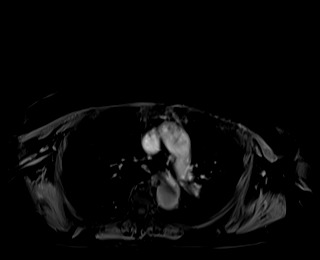

[Series 32: T1 dynamic · axial · 3.0mm · 1.25mm/px · z∈[-41,+100]mm · 2 of 96 slices shown (10 of 10)]
[im 1/96]
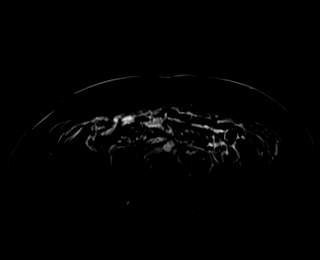
[im 48/96]
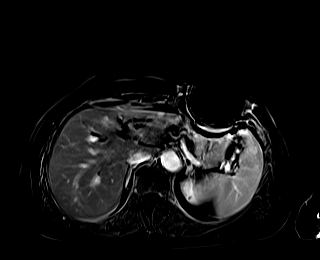

[47 of 48 positions shown; findings below may reference images not displayed]

FINDINGS: Lower chest: Mild cardiomegaly. 2.6 by 0.9 cm T1 and T2 signal
hyperintensity inferiorly in the right breast probably related to
recent procedure. Metal artifact along the left anterior
thoracoabdominal wall. Thoracic spondylosis and dextroconvex
thoracic scoliosis.

Hepatobiliary: 6 by 5 mm right hepatic lobe lesion posteromedially
near the dome of the liver on image 36 of series 21 appears stable
from [DATE] and may have some arterial phase enhancement on
image 35 of series 16 but quickly becomes hypointense on later
phases. This lesion is occult on T2 weighted images. This could
reflect atypical focal nodular hyperplasia. The long-term stability
indicates a benign process.

Stable lateral segment left hepatic lobe cyst anteriorly. There is
continued mild intrahepatic biliary dilatation and extrahepatic
biliary dilatation with the common hepatic duct up to 1.8 cm in
diameter (formerly 1.7 cm) and the common bile duct at 1.2 cm in
diameter (formerly 1.0 cm). No definite obstructing lesion of the
distal CBD, much of this could represent a physiologic response to
prior cholecystectomy.

Pancreas: Unremarkable, portions of the pancreatic tail are obscured
on some images due to the metal artifact along the anterior
thoracoabdominal wall.

Spleen:  Unremarkable

Adrenals/Urinary Tract: Findings of prior right kidney lower pole
cryoablation and embolization therapy, without recurrent mass
identified. Localized region of post therapy related findings on
coronal T2 weighted images measures 1.9 by 0.9 cm on image 16 series
10, formerly about 3.0 by 1.9 cm. Small benign bilateral renal cysts
are present.

Stomach/Bowel: Unremarkable

Vascular/Lymphatic: Abdominal aortic atherosclerosis. No pathologic
adenopathy.

Other: On coronal images, a right adnexal cystic lesion measures up
to 1.8 cm in visualized IM, although this is only partially included
on today's exam. Visualized portion appears simple. No follow-up
imaging is required. Reference: JACR [DATE]):[id number]

Musculoskeletal: Thoracolumbar spondylosis and degenerative disc
disease with dextroconvex thoracic scoliosis.
IMPRESSION: 1. No findings of tumor recurrence at cryoablation site. Reduced
size and conspicuity of post ablation findings.
2. Mildly increased biliary dilatation without definite obstructing
lesion. This may represent a physiologic response to
cholecystectomy, correlate with bilirubin levels in determining
whether any further workup such as ERCP may be warranted.
3. Other imaging findings of potential clinical significance: Mild
cardiomegaly. Aortic Atherosclerosis ([DB]-[DB]). Right inferior
breast accentuated signal likely related to recent procedure. Small
right hepatic lobe lesion is not entirely specific but probably
represents focal nodular hyperplasia, stability from [DB] indicates
benign incidental process. Thoracolumbar spondylosis with
dextroconvex thoracic scoliosis.

## 2021-09-07 MED ORDER — GADOBUTROL 1 MMOL/ML IV SOLN
8.5000 mL | Freq: Once | INTRAVENOUS | Status: AC | PRN
  Administered 2021-09-07: 8.5 mL via INTRAVENOUS

## 2021-09-08 ENCOUNTER — Ambulatory Visit: Payer: Medicare PPO | Admitting: Cardiology

## 2021-09-08 ENCOUNTER — Encounter: Payer: Self-pay | Admitting: Cardiology

## 2021-09-08 VITALS — BP 128/70 | HR 49 | Ht 65.0 in | Wt 177.0 lb

## 2021-09-08 DIAGNOSIS — I7789 Other specified disorders of arteries and arterioles: Secondary | ICD-10-CM | POA: Diagnosis not present

## 2021-09-08 DIAGNOSIS — I480 Paroxysmal atrial fibrillation: Secondary | ICD-10-CM

## 2021-09-08 DIAGNOSIS — R079 Chest pain, unspecified: Secondary | ICD-10-CM | POA: Diagnosis not present

## 2021-09-08 DIAGNOSIS — I671 Cerebral aneurysm, nonruptured: Secondary | ICD-10-CM

## 2021-09-08 DIAGNOSIS — Z95818 Presence of other cardiac implants and grafts: Secondary | ICD-10-CM | POA: Diagnosis not present

## 2021-09-08 MED ORDER — METOPROLOL SUCCINATE ER 50 MG PO TB24
50.0000 mg | ORAL_TABLET | Freq: Every day | ORAL | 3 refills | Status: DC
Start: 2021-09-08 — End: 2022-09-06

## 2021-09-08 NOTE — Progress Notes (Signed)
Cardiology Office Note:    Date:  09/08/2021   ID:  [name], DOB 1941/02/27, MRN [id number]  PCP:  Aretta Nip, MD  Cardiologist:  Jenne Campus, MD    Referring MD: Aretta Nip, MD   No chief complaint on file. I am doing fine but I need breast surgery for breast cancer  History of Present Illness:    Kimberly Gay is a 81 y.o. female  with past medical history significant for paroxysmal atrial fibrillation, there was an issue with anticoagulation see she did have a brain aneurysm eventually she end up getting watchman device which was implanted at [facility]  Since that time she is being not anticoagulated doing well.  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  She comes today to be evaluated before that surgery.  Interestingly she is telling me that for the last few months has been experiencing exertional shortness of breath as well as symptoms chest pain when she walks.  She said overall is getting better over the last month but there was a time that she had difficulty doing shopping there was a time she have difficulty working and that her aunt thinks store.  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[place])    Brain aneurysm 04/03/2019   Breast cancer ([place]) 08/20/2021   Cancer ([place]) 1994   colon-resection   Cerebral aneurysm 04/03/2019   Cerebral aneurysm, nonruptured 2010   not treated due to location-monitor yearly   Dyspnea on exertion 04/03/2019   Dysrhythmia    afib    GERD (gastroesophageal reflux disease)    infrequent   Guaiac + stool 02/26/2020   Hypertension    well controlled   Incontinence of urine    Joint pain, knee    Palpitations 04/03/2019   Paroxysmal atrial fibrillation (HCC) CHA2DS2-VASc equals 4, not  anticoagulated so far because of history of intracranial bleed and aneurysm 10/28/2019   Precordial chest pain 04/03/2019   Stroke [facility])    due to aneurysm rupture in 2010    Urgency of urination     Past Surgical History:  Procedure Laterality Date   ABDOMINAL HYSTERECTOMY     ANEURYSM COILING  2010   cerebral coiling-no deficits   BREAST EXCISIONAL BIOPSY Left 04/05/1994   CHOLECYSTECTOMY     CYSTOSCOPY  09/03/2012   Procedure: CYSTOSCOPY;  Surgeon: Ailene Rud, MD;  Location: [facility];  Service: Urology;  Laterality: N/A;   FLEXIBLE SIGMOIDOSCOPY N/A 11/24/2014   Procedure: FLEXIBLE SIGMOIDOSCOPY;  Surgeon: Garlan Fair, MD;  Location: [facility];  Service: Endoscopy;  Laterality: N/A;   IR EMBO ART  VEN HEMORR LYMPH EXTRAV  INC GUIDE ROADMAPPING  11/18/2020   IR RADIOLOGIST EVAL & MGMT  09/23/2020   IR RADIOLOGIST EVAL & MGMT  12/15/2020   IR RADIOLOGIST EVAL & MGMT  02/23/2021   IR RENAL SUPRASEL UNI S&I MOD SED  11/18/2020   IR US GUIDE VASC ACCESS RIGHT  11/18/2020   LAMINECTOMY     LYSIS OF ADHESION  2000's   with cholecystectomy procedure   PILONIDAL CYST EXCISION     PUBOVAGINAL SLING  09/03/2012   Procedure: Gaynelle Arabian;  Surgeon: Ailene Rud, MD;  Location: [facility];  Service: Urology;  Laterality: N/A;  LYNX SLING    RADIOLOGY WITH ANESTHESIA Right 11/18/2020   Procedure: IR WITH ANESTHESIA RENAL CRYOABLATION;  Surgeon: Arne Cleveland, MD;  Location: [facility];  Service: Radiology;  Laterality: Right;    Current Medications: Current Meds  Medication Sig   acetaminophen (TYLENOL) 500 MG tablet Take 500-1,000 mg by mouth every 6 (six) hours as needed for mild pain.   aspirin EC 81 MG tablet Take 1 tablet (81 mg total) by mouth daily. Swallow whole.   cetirizine (ZYRTEC) 10 MG tablet Take 10 mg by mouth daily as needed for allergies.   cholecalciferol (VITAMIN D) 1000 UNITS tablet Take 2,000 Units by mouth daily.     EPINEPHrine (EPIPEN) 0.3 mg/0.3 mL SOAJ injection Inject 0.3 mLs (0.3 mg total) into the muscle once. (Patient taking differently: Inject 0.3 mg into the muscle as needed for anaphylaxis.)   furosemide (LASIX) 20 MG tablet Take 20 mg by mouth daily.    gabapentin (NEURONTIN) 100 MG capsule Take by mouth.   hydrocortisone 2.5 % cream Apply 2.5 application topically daily as needed (hemorrhoids).   metoprolol succinate (TOPROL-XL) 100 MG 24 hr tablet TAKE 1 TABLET BY MOUTH DAILY. TAKE WITH OR IMMEDIATELY FOLLOWING A MEAL. (Patient taking differently: Take 50 mg by mouth.)   nitroGLYCERIN (NITROSTAT) 0.4 MG SL tablet Place 1 tablet (0.4 mg total) under the tongue every 5 (five) minutes as needed for chest pain.   pantoprazole (PROTONIX) 40 MG tablet Take 40 mg by mouth 2 (two) times daily.   phenylephrine-shark liver oil-mineral oil-petrolatum (PREPARATION H) 0.25-3-14-71.9 % rectal ointment Place 1 application rectally daily.   Polyethyl Glycol-Propyl Glycol 0.4-0.3 % SOLN Place 1 drop into both eyes in the morning and at bedtime.     Allergies:   Amoxicillin, Diphenhydramine hcl, Meperidine and related, and Wasp venom   Social History   Socioeconomic History   Marital status: Widowed    Spouse name: Not on file   Number of children: Not on file   Years of education: Not on file   Highest education level: Not on file  Occupational History   Not on file  Tobacco Use   Smoking status: Never    Passive exposure: Never   Smokeless tobacco: Never  Vaping Use   Vaping Use: Never used  Substance and Sexual Activity   Alcohol use: No   Drug use: No   Sexual activity: Not on file  Other Topics Concern   Not on file  Social History Narrative   Not on file   Social Determinants of Health   Financial Resource Strain: Not on file  Food Insecurity: Not on file  Transportation Needs: Not on file  Physical Activity: Not on file  Stress: Not on file  Social Connections: Not on file      Family History: The patient's family history includes Breast cancer in her cousin; Diabetes in an other family member; Heart attack in her father; Ovarian cancer in her cousin; Seizures in her mother. ROS:   Please see the history of present illness.    All 14 point review of systems negative except as described per history of present illness  EKGs/Labs/Other Studies Reviewed:      Recent Labs: 11/19/2020: BUN 17; Creatinine, Ser 1.00; Hemoglobin 12.2; Platelets 202; Potassium 4.0; Sodium 137  Recent Lipid Panel No results found for: CHOL, TRIG, HDL, CHOLHDL, VLDL, LDLCALC, LDLDIRECT  Physical Exam:    VS:  BP 128/70 (BP Location:  Left Arm)    Pulse (!) 49    Ht 5\' 5"  (1.651 m)    Wt 177 lb (80.3 kg)    SpO2 95%    BMI 29.45 kg/m     Wt Readings from Last 3 Encounters:  09/08/21 177 lb (80.3 kg)  09/02/21 180 lb 6.4 oz (81.8 kg)  12/30/20 177 lb (80.3 kg)     GEN:  Well nourished, well developed in no acute distress HEENT: Normal NECK: No JVD; No carotid bruits LYMPHATICS: No lymphadenopathy CARDIAC: RRR, no murmurs, no rubs, no gallops RESPIRATORY:  Clear to auscultation without rales, wheezing or rhonchi  ABDOMEN: Soft, non-tender, non-distended MUSCULOSKELETAL:  No edema; No deformity  SKIN: Warm and dry LOWER EXTREMITIES: no swelling NEUROLOGIC:  Alert and oriented x 3 PSYCHIATRIC:  Normal affect   ASSESSMENT:    1. Paroxysmal atrial fibrillation (HCC)   2. Cerebral aneurysm, nonruptured   3. Ascending aorta enlargement (HCC)   4. Presence of Watchman left atrial appendage closure device    PLAN:    In order of problems listed above:  Paroxysmal atrial fibrillation.  She maintained sinus rhythm she is actually sinus bradycardia I will ask her to lower her metoprolol to only 50 mg daily. Dyspnea on exertion I will schedule her to have echocardiogram to assess left ventricle ejection fraction. Atypical chest pain with some worrisome characteristic.  We will  schedule her to have Lexiscan. Cardiovascular evaluation before breast surgery overall lumpectomy is considered low risk surgery what make me concerned about her symptoms.  Stress test echocardiogram will be done. Dyslipidemia I did review K PN which show LDL of 101 HDL 55.  We will talk to her later about addressing this problem.   Medication Adjustments/Labs and Tests Ordered: Current medicines are reviewed at length with the patient today.  Concerns regarding medicines are outlined above.  No orders of the defined types were placed in this encounter.  Medication changes: No orders of the defined types were placed in this encounter.   Signed, Park Liter, MD, [facility] 09/08/2021 10:33 AM    [place]

## 2021-09-08 NOTE — Patient Instructions (Signed)
Medication Instructions:  Your physician has recommended you make the following change in your medication:   START: Metoprolol 50 mg daily  *If you need a refill on your cardiac medications before your next appointment, please call your pharmacy*   Lab Work: None If you have labs (blood work) drawn today and your tests are completely normal, you will receive your results only by: [facility] (if you have MyChart) OR A paper copy in the mail If you have any lab test that is abnormal or we need to change your treatment, we will call you to review the results.   Testing/Procedures: Your physician has requested that you have an echocardiogram. Echocardiography is a painless test that uses sound waves to create images of your heart. It provides your doctor with information about the size and shape of your heart and how well your hearts chambers and valves are working. This procedure takes approximately one hour. There are no restrictions for this procedure.    [facility] at [facility] [address] [place], [place] [address] Phone: [phone]    Please arrive 15 minutes prior to your appointment time for registration and insurance purposes.  The test will take approximately 3 to 4 hours to complete; you may bring reading material.  If someone comes with you to your appointment, they will need to remain in the main lobby due to limited space in the testing area. **If you are pregnant or breastfeeding, please notify the nuclear lab prior to your appointment**  How to prepare for your Myocardial Perfusion Test: Do not eat or drink 3 hours prior to your test, except you may have water. Do not consume products containing caffeine (regular or decaffeinated) 12 hours prior to your test. (ex: coffee, chocolate, sodas, tea). Do bring a list of your current medications with you.  If not listed below, you may take your medications as normal. Do  wear comfortable clothes (no dresses or overalls) and walking shoes, tennis shoes preferred (No heels or open toe shoes are allowed). Do NOT wear cologne, perfume, aftershave, or lotions (deodorant is allowed). If these instructions are not followed, your test will have to be rescheduled.  Please report to [address] for your test.  If you have questions or concerns about your appointment, you can call the Nuclear Lab at [phone].  If you cannot keep your appointment, please provide 24 hours notification to the Nuclear Lab, to avoid a possible $50 charge to your account.    Follow-Up: At [facility], you and your health needs are our priority.  As part of our continuing mission to provide you with exceptional heart care, we have created designated Provider Care Teams.  These Care Teams include your primary Cardiologist (physician) and Advanced Practice Providers (APPs -  Physician Assistants and Nurse Practitioners) who all work together to provide you with the care you need, when you need it.  We recommend signing up for the patient portal called "MyChart".  Sign up information is provided on this After Visit Summary.  MyChart is used to connect with patients for Virtual Visits (Telemedicine).  Patients are able to view lab/test results, encounter notes, upcoming appointments, etc.  Non-urgent messages can be sent to your provider as well.   To learn more about what you can do with MyChart, go to [email or web address].    Your next appointment:   6 month(s)  The format for your next appointment:   In Person  Provider:   Jenne Campus, MD    Other Instructions None

## 2021-09-10 ENCOUNTER — Telehealth (HOSPITAL_COMMUNITY): Payer: Self-pay | Admitting: *Deleted

## 2021-09-10 NOTE — Telephone Encounter (Signed)
Close encounter 

## 2021-09-14 ENCOUNTER — Ambulatory Visit (HOSPITAL_COMMUNITY)
Admission: RE | Admit: 2021-09-14 | Discharge: 2021-09-14 | Disposition: A | Discharge status: Home / Self Care | Payer: Medicare PPO | Source: Ambulatory Visit | Attending: Cardiovascular Disease | Admitting: Cardiovascular Disease

## 2021-09-14 ENCOUNTER — Other Ambulatory Visit: Payer: Self-pay

## 2021-09-14 DIAGNOSIS — R079 Chest pain, unspecified: Secondary | ICD-10-CM | POA: Insufficient documentation

## 2021-09-14 LAB — MYOCARDIAL PERFUSION IMAGING
Base ST Depression (mm): 0 mm
Peak HR: 95 {beats}/min
Rest HR: 64 {beats}/min
Rest Nuclear Isotope Dose: 11 mCi
SDS: 4
SRS: 0
SSS: 4
ST Depression (mm): 0 mm
Stress Nuclear Isotope Dose: 32.1 mCi
TID: 1.25

## 2021-09-14 MED ORDER — REGADENOSON 0.4 MG/5ML IV SOLN
0.4000 mg | Freq: Once | INTRAVENOUS | Status: AC
  Administered 2021-09-14: 0.4 mg via INTRAVENOUS

## 2021-09-14 MED ORDER — TECHNETIUM TC 99M TETROFOSMIN IV KIT
32.1000 | PACK | Freq: Once | INTRAVENOUS | Status: AC | PRN
  Administered 2021-09-14: 32.1 via INTRAVENOUS
  Filled 2021-09-14: qty 33

## 2021-09-14 MED ORDER — TECHNETIUM TC 99M TETROFOSMIN IV KIT
11.0000 | PACK | Freq: Once | INTRAVENOUS | Status: AC | PRN
  Administered 2021-09-14: 11 via INTRAVENOUS
  Filled 2021-09-14: qty 11

## 2021-09-20 ENCOUNTER — Ambulatory Visit (HOSPITAL_COMMUNITY): Payer: Medicare PPO | Attending: Cardiology

## 2021-09-20 ENCOUNTER — Ambulatory Visit (HOSPITAL_COMMUNITY): Payer: Medicare PPO

## 2021-09-20 ENCOUNTER — Other Ambulatory Visit: Payer: Self-pay

## 2021-09-20 DIAGNOSIS — I480 Paroxysmal atrial fibrillation: Secondary | ICD-10-CM | POA: Insufficient documentation

## 2021-09-20 DIAGNOSIS — I671 Cerebral aneurysm, nonruptured: Secondary | ICD-10-CM | POA: Insufficient documentation

## 2021-09-20 DIAGNOSIS — R079 Chest pain, unspecified: Secondary | ICD-10-CM | POA: Diagnosis not present

## 2021-09-20 DIAGNOSIS — I7789 Other specified disorders of arteries and arterioles: Secondary | ICD-10-CM | POA: Insufficient documentation

## 2021-09-20 DIAGNOSIS — Z95818 Presence of other cardiac implants and grafts: Secondary | ICD-10-CM | POA: Insufficient documentation

## 2021-09-20 LAB — ECHOCARDIOGRAM COMPLETE
Area-P 1/2: 2.56 cm2
S' Lateral: 3 cm

## 2021-09-21 NOTE — Pre-Procedure Instructions (Signed)
Surgical Instructions    Your procedure is scheduled on Tuesday 09/28/21.   Report to [facility] Main Entrance "A" at 08:00 A.M., then check in with the Admitting office.  Call this number if you have problems the morning of surgery:  [phone]   If you have any questions prior to your surgery date call [phone]: Open Monday-Friday 8am-4pm    Remember:  Do not eat after midnight the night before your surgery  You may drink clear liquids until 07:00 A.M. the morning of your surgery.   Clear liquids allowed are: Water, Non-Citrus Juices (without pulp), Carbonated Beverages, Clear Tea, Black Coffee ONLY (NO MILK, CREAM OR POWDERED CREAMER of any kind), and Gatorade    Take these medicines the morning of surgery with A SIP OF WATER:   metoprolol succinate (TOPROL-XL)  pantoprazole (PROTONIX)  Polyethyl Glycol-Propyl Glycol (SYSTANE OP)   Take these medicines if needed:   acetaminophen (TYLENOL)  cetirizine (ZYRTEC)   nitroGLYCERIN (NITROSTAT)   As of today, STOP taking any Aspirin (unless otherwise instructed by your surgeon) Aleve, Naproxen, Ibuprofen, Motrin, Advil, Goody's, BC's, all herbal medications, fish oil, and all vitamins.           Do not wear jewelry or makeup Do not wear lotions, powders, perfumes/colognes, or deodorant. Do not shave 48 hours prior to surgery.  Men may shave face and neck. Do not bring valuables to the hospital. Do not wear nail polish, gel polish, artificial nails, or any other type of covering on natural nails (fingers and toes) If you have artificial nails or gel coating that need to be removed by a nail salon, please have this removed prior to surgery. Artificial nails or gel coating may interfere with anesthesia's ability to adequately monitor your vital signs.  [facility] is not responsible for any belongings or valuables. .   Do NOT Smoke (Tobacco/Vaping)  24 hours prior to your procedure  If you use a CPAP at night, you may bring  your mask for your overnight stay.   Contacts, glasses, hearing aids, dentures or partials may not be worn into surgery, please bring cases for these belongings   For patients admitted to the hospital, discharge time will be determined by your treatment team.   Patients discharged the day of surgery will not be allowed to drive home, and someone needs to stay with them for 24 hours.  NO VISITORS WILL BE ALLOWED IN PRE-OP WHERE PATIENTS ARE PREPPED FOR SURGERY.  ONLY 1 SUPPORT PERSON MAY BE PRESENT IN THE WAITING ROOM WHILE YOU ARE IN SURGERY.  IF YOU ARE TO BE ADMITTED, ONCE YOU ARE IN YOUR ROOM YOU WILL BE ALLOWED TWO (2) VISITORS. 1 (ONE) VISITOR MAY STAY OVERNIGHT BUT MUST ARRIVE TO THE ROOM BY 8pm.  Minor children may have two parents present. Special consideration for safety and communication needs will be reviewed on a case by case basis.  Special instructions:    Oral Hygiene is also important to reduce your risk of infection.  Remember - BRUSH YOUR TEETH THE MORNING OF SURGERY WITH YOUR REGULAR TOOTHPASTE   [facility]- Preparing For Surgery  Before surgery, you can play an important role. Because skin is not sterile, your skin needs to be as free of germs as possible. You can reduce the number of germs on your skin by washing with CHG (chlorahexidine gluconate) Soap before surgery.  CHG is an antiseptic cleaner which kills germs and bonds with the skin to continue killing germs even  after washing.     Please do not use if you have an allergy to CHG or antibacterial soaps. If your skin becomes reddened/irritated stop using the CHG.  Do not shave (including legs and underarms) for at least 48 hours prior to first CHG shower. It is OK to shave your face.  Please follow these instructions carefully.     Shower the NIGHT BEFORE SURGERY and the MORNING OF SURGERY with CHG Soap.   If you chose to wash your hair, wash your hair first as usual with your normal shampoo. After you shampoo,  rinse your hair and body thoroughly to remove the shampoo.  Then [employer] and genitals (private parts) with your normal soap and rinse thoroughly to remove soap.  After that Use CHG Soap as you would any other liquid soap. You can apply CHG directly to the skin and wash gently with a scrungie or a clean washcloth.   Apply the CHG Soap to your body ONLY FROM THE NECK DOWN.  Do not use on open wounds or open sores. Avoid contact with your eyes, ears, mouth and genitals (private parts). Wash Face and genitals (private parts)  with your normal soap.   Wash thoroughly, paying special attention to the area where your surgery will be performed.  Thoroughly rinse your body with warm water from the neck down.  DO NOT shower/wash with your normal soap after using and rinsing off the CHG Soap.  Pat yourself dry with a CLEAN TOWEL.  Wear CLEAN PAJAMAS to bed the night before surgery  Place CLEAN SHEETS on your bed the night before your surgery  DO NOT SLEEP WITH PETS.   Day of Surgery:  Take a shower with CHG soap. Wear Clean/Comfortable clothing the morning of surgery Do not apply any deodorants/lotions.   Remember to brush your teeth WITH YOUR REGULAR TOOTHPASTE.    COVID testing  If you are going to stay overnight or be admitted after your procedure/surgery and require a pre-op COVID test, please follow these instructions after your COVID test   You are not required to quarantine however you are required to wear a well-fitting mask when you are out and around people not in your household.  If your mask becomes wet or soiled, replace with a new one.  Wash your hands often with soap and water for 20 seconds or clean your hands with an alcohol-based hand sanitizer that contains at least 60% alcohol.  Do not share personal items.  Notify your provider: if you are in close contact with someone who has COVID  or if you develop a fever of 100.4 or greater, sneezing, cough, sore throat,  shortness of breath or body aches.    Please read over the following fact sheets that you were given.

## 2021-09-22 ENCOUNTER — Encounter (HOSPITAL_COMMUNITY)
Admission: RE | Admit: 2021-09-22 | Discharge: 2021-09-22 | Disposition: A | Discharge status: Home / Self Care | Payer: Medicare PPO | Source: Ambulatory Visit | Attending: Surgery | Admitting: Surgery

## 2021-09-22 ENCOUNTER — Other Ambulatory Visit: Payer: Self-pay

## 2021-09-22 ENCOUNTER — Encounter (HOSPITAL_COMMUNITY): Payer: Self-pay

## 2021-09-22 VITALS — BP 136/71 | HR 68 | Temp 98.5°F | Resp 17 | Ht 65.0 in | Wt 178.4 lb

## 2021-09-22 DIAGNOSIS — I480 Paroxysmal atrial fibrillation: Secondary | ICD-10-CM | POA: Diagnosis not present

## 2021-09-22 DIAGNOSIS — D0511 Intraductal carcinoma in situ of right breast: Secondary | ICD-10-CM | POA: Insufficient documentation

## 2021-09-22 DIAGNOSIS — I2510 Atherosclerotic heart disease of native coronary artery without angina pectoris: Secondary | ICD-10-CM

## 2021-09-22 DIAGNOSIS — Z8673 Personal history of transient ischemic attack (TIA), and cerebral infarction without residual deficits: Secondary | ICD-10-CM | POA: Diagnosis not present

## 2021-09-22 DIAGNOSIS — I10 Essential (primary) hypertension: Secondary | ICD-10-CM | POA: Insufficient documentation

## 2021-09-22 DIAGNOSIS — Z01812 Encounter for preprocedural laboratory examination: Secondary | ICD-10-CM | POA: Insufficient documentation

## 2021-09-22 DIAGNOSIS — Z01818 Encounter for other preprocedural examination: Secondary | ICD-10-CM

## 2021-09-22 DIAGNOSIS — Z85038 Personal history of other malignant neoplasm of large intestine: Secondary | ICD-10-CM | POA: Diagnosis not present

## 2021-09-22 DIAGNOSIS — K449 Diaphragmatic hernia without obstruction or gangrene: Secondary | ICD-10-CM | POA: Insufficient documentation

## 2021-09-22 DIAGNOSIS — R002 Palpitations: Secondary | ICD-10-CM | POA: Diagnosis not present

## 2021-09-22 DIAGNOSIS — R0789 Other chest pain: Secondary | ICD-10-CM | POA: Insufficient documentation

## 2021-09-22 DIAGNOSIS — R0600 Dyspnea, unspecified: Secondary | ICD-10-CM | POA: Insufficient documentation

## 2021-09-22 DIAGNOSIS — I081 Rheumatic disorders of both mitral and tricuspid valves: Secondary | ICD-10-CM | POA: Insufficient documentation

## 2021-09-22 DIAGNOSIS — I491 Atrial premature depolarization: Secondary | ICD-10-CM | POA: Diagnosis not present

## 2021-09-22 DIAGNOSIS — I77810 Thoracic aortic ectasia: Secondary | ICD-10-CM | POA: Insufficient documentation

## 2021-09-22 DIAGNOSIS — K219 Gastro-esophageal reflux disease without esophagitis: Secondary | ICD-10-CM | POA: Diagnosis not present

## 2021-09-22 DIAGNOSIS — Z9049 Acquired absence of other specified parts of digestive tract: Secondary | ICD-10-CM | POA: Diagnosis not present

## 2021-09-22 DIAGNOSIS — Z87448 Personal history of other diseases of urinary system: Secondary | ICD-10-CM | POA: Insufficient documentation

## 2021-09-22 HISTORY — DX: Personal history of other diseases of the digestive system: Z87.19

## 2021-09-22 HISTORY — DX: Other specified disorders of kidney and ureter: N28.89

## 2021-09-22 LAB — CBC
HCT: 41.5 % (ref 36.0–46.0)
Hemoglobin: 12.8 g/dL (ref 12.0–15.0)
MCH: 26.2 pg (ref 26.0–34.0)
MCHC: 30.8 g/dL (ref 30.0–36.0)
MCV: 84.9 fL (ref 80.0–100.0)
Platelets: 240 10*3/uL (ref 150–400)
RBC: 4.89 MIL/uL (ref 3.87–5.11)
RDW: 14.6 % (ref 11.5–15.5)
WBC: 7.9 10*3/uL (ref 4.0–10.5)
nRBC: 0 % (ref 0.0–0.2)

## 2021-09-22 LAB — BASIC METABOLIC PANEL
Anion gap: 6 (ref 5–15)
BUN: 20 mg/dL (ref 8–23)
CO2: 28 mmol/L (ref 22–32)
Calcium: 10 mg/dL (ref 8.9–10.3)
Chloride: 105 mmol/L (ref 98–111)
Creatinine, Ser: 1.03 mg/dL — ABNORMAL HIGH (ref 0.44–1.00)
GFR, Estimated: 55 mL/min — ABNORMAL LOW (ref >60)
Glucose, Bld: 87 mg/dL (ref 70–99)
Potassium: 4 mmol/L (ref 3.5–5.1)
Sodium: 139 mmol/L (ref 135–145)

## 2021-09-22 NOTE — Progress Notes (Signed)
[facility] CONSULT NOTE  Patient Care Team: Rankins, Bill Salinas, MD as PCP - General (Family Medicine) Park Liter, MD as PCP - Cardiology (Cardiology)  CHIEF COMPLAINTS/PURPOSE OF CONSULTATION:  Newly diagnosed breast cancer  HISTORY OF PRESENTING ILLNESS:  Kimberly Gay 81 y.o. female is here because of recent diagnosis of intraductal papilloma of the right breast. Screening mammogram on 07/06/2021 showed right breast calcifications. Diagnostic mammogram and [place] on 08/12/2021 showed Indeterminate 1.2 cm group of retroareolar right breast calcifications. Biopsy on 08/20/2021 showed intraductal papilloma with DCIS, ER+(100%)/PR+(20%). She presents to the clinic today for initial evaluation and discussion of treatment options.   I reviewed her records extensively and collaborated the history with the patient.  SUMMARY OF ONCOLOGIC HISTORY: Oncology History  Ductal carcinoma in situ (DCIS) of right breast  08/20/2021 Initial Diagnosis   Screening mammogram: right breast calcifications. Diagnostic mammogram: Indeterminate 1.2 cm group of retroareolar right breast calcifications. Biopsy: intraductal papilloma with DCIS, ER+(100%)/PR+(20%).    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[place])    Brain aneurysm 04/03/2019   Breast cancer ([place]) 08/20/2021   Cancer ([place]) 1994   colon-resection   Cerebral aneurysm 04/03/2019   Cerebral aneurysm, nonruptured 2010   not treated due to location-monitor yearly   Dyspnea on exertion 04/03/2019   Dysrhythmia    afib    GERD (gastroesophageal reflux disease)    infrequent   Guaiac + stool 02/26/2020   History of hiatal hernia    Hypertension     well controlled   Incontinence of urine    Joint pain, knee    Palpitations 04/03/2019   Paroxysmal atrial fibrillation (HCC) CHA2DS2-VASc equals 4, not anticoagulated so far because of history of intracranial bleed and aneurysm 10/28/2019   Precordial chest pain 04/03/2019   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    Urgency of urination     SURGICAL HISTORY: Past Surgical History:  Procedure Laterality Date   ABDOMINAL HYSTERECTOMY     ANEURYSM COILING  2010   cerebral coiling-no deficits   BACK SURGERY     BREAST EXCISIONAL BIOPSY Left 04/05/1994   CHOLECYSTECTOMY     CYSTOSCOPY  09/03/2012   Procedure: CYSTOSCOPY;  Surgeon: Ailene Rud, MD;  Location: [facility];  Service: Urology;  Laterality: N/A;   FLEXIBLE SIGMOIDOSCOPY N/A 11/24/2014   Procedure: FLEXIBLE SIGMOIDOSCOPY;  Surgeon: Garlan Fair, MD;  [facility];  Service: Endoscopy;  Laterality: N/A;   IR EMBO ART  VEN HEMORR LYMPH EXTRAV  INC GUIDE ROADMAPPING  11/18/2020   IR RADIOLOGIST EVAL & MGMT  09/23/2020   IR RADIOLOGIST EVAL & MGMT  12/15/2020   IR RADIOLOGIST EVAL & MGMT  02/23/2021   IR RENAL SUPRASEL UNI S&I MOD SED  11/18/2020   IR US GUIDE VASC ACCESS RIGHT  11/18/2020   LAMINECTOMY     LEFT ATRIAL APPENDAGE OCCLUSION     device implanted 03/25/20 at [facility]  2000's   with cholecystectomy procedure   PILONIDAL CYST EXCISION     PUBOVAGINAL SLING  09/03/2012   Procedure: Gaynelle Arabian;  Surgeon: Ailene Rud,  MD;  Location: [facility];  Service: Urology;  Laterality: N/A;  LYNX SLING    RADIOLOGY WITH ANESTHESIA Right 11/18/2020   Procedure: IR WITH ANESTHESIA RENAL CRYOABLATION;  Surgeon: Arne Cleveland, MD;  Location[facility];  Service: Radiology;  Laterality: Right;    SOCIAL HISTORY: Social History   Socioeconomic History   Marital status: Widowed    Spouse name: Not on file   Number of children:  Not on file   Years of education: Not on file   Highest education level: Not on file  Occupational History   Not on file  Tobacco Use   Smoking status: Never    Passive exposure: Never   Smokeless tobacco: Never  Vaping Use   Vaping Use: Never used  Substance and Sexual Activity   Alcohol use: No   Drug use: No   Sexual activity: Not on file  Other Topics Concern   Not on file  Social History Narrative   Not on file   Social Determinants of Health   Financial Resource Strain: Not on file  Food Insecurity: Not on file  Transportation Needs: Not on file  Physical Activity: Not on file  Stress: Not on file  Social Connections: Not on file  Intimate Partner Violence: Not At Risk   Fear of Current or Ex-Partner: No   Emotionally Abused: No   Physically Abused: No   Sexually Abused: No    FAMILY HISTORY: Family History  Problem Relation Age of Onset   Seizures Mother    Heart attack Father    Breast cancer Cousin    Ovarian cancer Cousin    Diabetes Other     ALLERGIES:  is allergic to amoxicillin, meperidine and related, and wasp venom.  MEDICATIONS:  Current Outpatient Medications  Medication Sig Dispense Refill   acetaminophen (TYLENOL) 500 MG tablet Take 500-1,000 mg by mouth every 6 (six) hours as needed for mild pain.     aspirin EC 81 MG tablet Take 1 tablet (81 mg total) by mouth daily. Swallow whole. 90 tablet 3   cetirizine (ZYRTEC) 10 MG tablet Take 10 mg by mouth daily as needed for allergies.     cholecalciferol (VITAMIN D) 1000 UNITS tablet Take 2,000 Units by mouth daily.      EPINEPHrine (EPIPEN) 0.3 mg/0.3 mL SOAJ injection Inject 0.3 mLs (0.3 mg total) into the muscle once. (Patient taking differently: Inject 0.3 mg into the muscle as needed for anaphylaxis.) 1 Device 0   furosemide (LASIX) 20 MG tablet Take 20 mg by mouth daily.      gabapentin (NEURONTIN) 100 MG capsule Take 100 mg by mouth at bedtime.     hydrocortisone 2.5 % cream Apply 2.5  application topically daily as needed (hemorrhoids).     metoprolol succinate (TOPROL-XL) 50 MG 24 hr tablet Take 1 tablet (50 mg total) by mouth daily. Take with or immediately following a meal. 90 tablet 3   nitroGLYCERIN (NITROSTAT) 0.4 MG SL tablet Place 1 tablet (0.4 mg total) under the tongue every 5 (five) minutes as needed for chest pain. 25 tablet 11   pantoprazole (PROTONIX) 40 MG tablet Take 40 mg by mouth 2 (two) times daily.     phenylephrine-shark liver oil-mineral oil-petrolatum (PREPARATION H) 0.25-3-14-71.9 % rectal ointment Place 1 application rectally 2 (two) times daily as needed for hemorrhoids.     Polyethyl Glycol-Propyl Glycol (SYSTANE OP) Place 1 drop into both eyes 2 (two) times daily.     No  current facility-administered medications for this visit.    REVIEW OF SYSTEMS:   Constitutional: Denies fevers, chills or abnormal night sweats   Breast:  Denies any palpable lumps or discharge All other systems were reviewed with the patient and are negative.  PHYSICAL EXAMINATION: ECOG PERFORMANCE STATUS: 1 - Symptomatic but completely ambulatory  Vitals:   09/23/21 1515  BP: 128/68  Pulse: 65  Resp: 18  Temp: 97.7 F (36.5 C)  SpO2: 97%   Filed Weights   09/23/21 1515  Weight: 178 lb 3.2 oz (80.8 kg)       LABORATORY DATA:  I have reviewed the data as listed Lab Results  Component Value Date   WBC 7.9 09/22/2021   HGB 12.8 09/22/2021   HCT 41.5 09/22/2021   MCV 84.9 09/22/2021   PLT 240 09/22/2021   Lab Results  Component Value Date   NA 139 09/22/2021   K 4.0 09/22/2021   CL 105 09/22/2021   CO2 28 09/22/2021    RADIOGRAPHIC STUDIES: I have personally reviewed the radiological reports and agreed with the findings in the report.  ASSESSMENT AND PLAN:  Ductal carcinoma in situ (DCIS) of right breast 08/20/2021:Screening mammogram: right breast calcifications. Diagnostic mammogram: Indeterminate 1.2 cm group of retroareolar right breast  calcifications. Biopsy: intraductal papilloma with DCIS, ER+(100%)/PR+(20%).   Pathology review: I discussed with the patient the difference between DCIS and invasive breast cancer. It is considered a precancerous lesion. DCIS is classified as a 0. It is generally detected through mammograms as calcifications. We discussed the significance of grades and its impact on prognosis. We also discussed the importance of ER and PR receptors and their implications to adjuvant treatment options. Prognosis of DCIS dependence on grade, comedo necrosis. It is anticipated that if not treated, 20-30% of DCIS can develop into invasive breast cancer.  Recommendation: 1. Breast conserving surgery 2. either adjuvant radiation therapy or adjuvant tamoxifen    I discussed with her that she may only need either the radiation or antiestrogen therapy and not both.  Tamoxifen counseling: We discussed the risks and benefits of tamoxifen. These include but not limited to insomnia, hot flashes, mood changes, vaginal dryness, and weight gain. Although rare, serious side effects including endometrial cancer, risk of blood clots were also discussed. We strongly believe that the benefits far outweigh the risks. Patient understands these risks and consented to starting treatment. Planned treatment duration is 5 years.  Because of a prior history of colon cancer, I will refer her to genetics. Counseled her about the DCIS blood draw study. Return to clinic after surgery to discuss the final pathology report and come up with an adjuvant treatment plan.    All questions were answered. The patient knows to call the clinic with any problems, questions or concerns.   Rulon Eisenmenger, MD, MPH 09/23/2021    I, Thana Ates, am acting as scribe for Nicholas Lose, MD.  I have reviewed the above documentation for accuracy and completeness, and I agree with the above.

## 2021-09-22 NOTE — Progress Notes (Signed)
PCP - Nonah Mattes NP Cardiologist - Orlean Bradford  PPM/ICD - denies Device Orders -  Rep Notified -   Chest x-ray 11/12/20-  EKG - 2/`1/23 Stress Test - 09/14/21 ECHO - 09/20/20 Cardiac Cath - none  Sleep Study - none CPAP -   Fasting Blood Sugar - n/a Checks Blood Sugar _____ times a day  Blood Thinner Instructions:n/a Aspirin Instructions:pt instructed to call Dr. Josetta Huddle office for information on when to stop aspirin. She stated that she understands  ERAS Protcol - clear liquids until 0700 PRE-SURGERY Ensure or G2- no  COVID TEST- no-ambulatory surgery   Anesthesia review: yes for history of A fib,Watchman's device.   Patient denies shortness of breath, fever, cough and chest pain at PAT appointment   All instructions explained to the patient, with a verbal understanding of the material. Patient agrees to go over the instructions while at home for a better understanding. Patient also instructed to wear a mask while out in public prior to surgery.. The opportunity to ask questions was provided.

## 2021-09-23 ENCOUNTER — Ambulatory Visit: Payer: Medicare PPO

## 2021-09-23 ENCOUNTER — Inpatient Hospital Stay: Payer: Medicare PPO | Attending: Hematology and Oncology | Admitting: Hematology and Oncology

## 2021-09-23 ENCOUNTER — Encounter (HOSPITAL_COMMUNITY): Payer: Self-pay

## 2021-09-23 ENCOUNTER — Encounter: Payer: Self-pay | Admitting: *Deleted

## 2021-09-23 DIAGNOSIS — D0511 Intraductal carcinoma in situ of right breast: Secondary | ICD-10-CM | POA: Insufficient documentation

## 2021-09-23 DIAGNOSIS — I10 Essential (primary) hypertension: Secondary | ICD-10-CM | POA: Insufficient documentation

## 2021-09-23 DIAGNOSIS — Z90710 Acquired absence of both cervix and uterus: Secondary | ICD-10-CM | POA: Diagnosis not present

## 2021-09-23 DIAGNOSIS — Z803 Family history of malignant neoplasm of breast: Secondary | ICD-10-CM | POA: Diagnosis not present

## 2021-09-23 DIAGNOSIS — Z170 Estrogen receptor positive status [ER+]: Secondary | ICD-10-CM | POA: Diagnosis not present

## 2021-09-23 DIAGNOSIS — Z8041 Family history of malignant neoplasm of ovary: Secondary | ICD-10-CM | POA: Insufficient documentation

## 2021-09-23 NOTE — Progress Notes (Signed)
Anesthesia Chart Review:  Case: [id number] Date/Time: 09/28/21 0945   Procedure: RIGHT BREAST LUMPECTOMY WITH RADIOACTIVE SEED LOCALIZATION (Right: Breast)   Anesthesia type: General   Pre-op diagnosis: RIGHT BREAST DCIS   Location: [facility] OR ROOM 08 / [facility]   Surgeons: Erroll Luna, MD       DISCUSSION: Patient is an 81 year old female scheduled for the above procedure.  History includes never smoker, colon cancer (s/p colon resection 1994), HTN, PAF (Watchman Device 03/25/20 by Dr. 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    Last cardiology evaluation by Dr. Agustin Cree on 09/08/21. She told him about need for breast surgery. Also reported from [facility] and atypical chest pain, so he ordered a stress test and echocardiogram. Her 09/20/21 echo showed LVEF 60-65%, mild LVH, grade 1 DD, mildly enlarged RV, mild-moderate MR, mild-moderate TR. 09/14/21 stress test showed no evidence of ischemia or prior infarct. Study was not gated due to frequent PACs.   RSL is scheduled for 09/27/21 at 2:00 PM. Anesthesia team to evaluate on the day of surgery.    VS: BP 136/71    Pulse 68    Temp 36.9 C (Oral)    Resp 17    Ht 5\' 5"  (1.651 m)    Wt 80.9 kg    SpO2 99%    BMI 29.69 kg/m    PROVIDERS: Rankins, Bill Salinas, MD is PCP  - Jenne Campus, MD is cardiologist - Nicholas Lose, MD is HEM-ONC - Kyung Rudd, MD is RAD-ONC - Ruthann Cancer, MD is IR.  Last visit with Mir, Sharen Heck, MD on 02/23/21.  MRI at that time did not show evidence of recurrent or residual renal malignancy.  Repeat MRI with 1 year ~  April 2023. - Marcelline Mates, MD is neurosurgeon ([facility]). Consult on 03/19/21. He wrote, "She was last seen by Dr. 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 She also has a left sigmoid sinus type1 DAVF. She presents for 3 yr follow up with MRA Brain done today." The basilar artery aneurysm appears stable. He added, "She is doing well and has no aneurysm recurrence on today's imaging. She has additional trivial infundibuli. I discussed options at length. I have recommended no further surveillance imaging given her age. She agrees. No further follow-up is necessary."   LABS: Labs reviewed: Acceptable for surgery. (all labs ordered are listed, but only abnormal results are displayed)  Labs Reviewed  BASIC METABOLIC PANEL - Abnormal; Notable for the following components:      Result Value   Creatinine, Ser 1.03 (*)    GFR, Estimated 55 (*)    All other components within normal limits  CBC     IMAGES: MRI Abd 09/07/21: IMPRESSION: 1. No findings of tumor recurrence at cryoablation site. Reduced size and conspicuity of post ablation findings. 2. Mildly increased biliary dilatation without definite obstructing lesion. This may represent a physiologic response to cholecystectomy, correlate with bilirubin levels in determining whether any further workup such as ERCP may be warranted. 3. Other imaging findings of potential clinical  significance: Mild cardiomegaly. Aortic Atherosclerosis (ICD10-I70.0). Right inferior breast accentuated signal likely related to recent procedure. Small right hepatic lobe lesion is not entirely specific but probably represents focal nodular hyperplasia, stability from 2005 indicates benign incidental process. Thoracolumbar spondylosis with dextroconvex thoracic scoliosis.   MRA Brain 03/19/21 ([facility]): IMPRESSION: 1.  Tiny focus of residual filling of the face of the  coiled basilar apex aneurysm.  2.  Conically-shaped outpouching along the origin of the left superior cerebellar artery compatible with infundibulum.  3.  1.5 mm inferiorly directed outpouching arising from the supraclinoid left internal carotid artery may represent an infundibulum with emanating vessel below the resolution of MRA.   EKG: 09/08/2021: Sinus bradycardia 49 bpm.  Minimal voltage criteria for LVH, may be normal variant.   CV: Nuclear stress test 09/14/21:   Findings are consistent with no prior ischemia and no prior myocardial infarction. The study is low risk.   No ST deviation was noted.   LV perfusion is normal. There is no evidence of ischemia. There is no evidence of infarction.   Prior study available for comparison from 04/08/2019.   Study was not gated due to frequent PACs.    Echo 09/20/21: IMPRESSIONS  1. Left ventricular ejection fraction, by estimation, is 60 to 65%. The  left ventricle has normal function. The left ventricle has no regional  wall motion abnormalities. There is mild left ventricular hypertrophy.  Left ventricular diastolic parameters  are consistent with Grade I diastolic dysfunction (impaired relaxation).   2. Right ventricular systolic function is normal. The right ventricular  size is mildly enlarged.   3. The mitral valve is normal in structure. Mild to moderate mitral valve  regurgitation. No evidence of mitral stenosis.   4. Tricuspid valve regurgitation is mild to moderate.   5. The aortic valve is normal in structure. Aortic valve regurgitation is  not visualized. No aortic stenosis is present.   6. The inferior vena cava is normal in size with greater than 50%  respiratory variability, suggesting right atrial pressure of 3 mmHg.      Post-Watchman CT Heart 05/14/20 [facility]): IMPRESSION: - Interval placement of Watchman device in the left atrial appendage, which is occluded.  - Biliary ductal dilatation involving the left hepatic lobe,  incompletely characterized on this exam. Recommend further evaluation with cross-sectional abdominal CT or MRI with contrast.     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HCC)    ectactic 3.5 cm 05/03/19 CTA chest   Brain aneurysm 04/03/2019   Breast cancer ([place]) 08/20/2021   Cancer ([place]) 1994   colon-resection   Cerebral aneurysm 04/03/2019   Cerebral aneurysm, nonruptured 2010   not treated due to location-monitor yearly   Dyspnea on exertion 04/03/2019   Dysrhythmia    afib    GERD (gastroesophageal reflux disease)    infrequent   Guaiac + stool 02/26/2020   History of hiatal hernia    Hypertension    well controlled   Incontinence of urine    Joint pain, knee    Palpitations 04/03/2019   Paroxysmal atrial fibrillation (HCC) CHA2DS2-VASc equals 4, not anticoagulated so far because of history of intracranial bleed and aneurysm 10/28/2019   s/p Watchman Device 03/25/20   Precordial chest pain 04/03/2019   Renal mass    s/p right inferior 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   Stroke [facility])    due to aneurysm rupture in  2010    Urgency of urination     Past Surgical History:  Procedure Laterality Date   ABDOMINAL HYSTERECTOMY     ANEURYSM COILING  2010   cerebral coiling-no deficits   BACK SURGERY     BREAST EXCISIONAL BIOPSY Left 04/05/1994   CHOLECYSTECTOMY     CYSTOSCOPY  09/03/2012   Procedure: CYSTOSCOPY;  Surgeon: Ailene Rud, MD;  [facility]  Service: Urology;  Laterality: N/A;   FLEXIBLE SIGMOIDOSCOPY N/A 11/24/2014   Procedure: FLEXIBLE SIGMOIDOSCOPY;  Surgeon: Garlan Fair, MD;  [facility];  Service: Endoscopy;  Laterality: N/A;   IR EMBO ART  VEN HEMORR LYMPH EXTRAV  INC GUIDE ROADMAPPING  11/18/2020   IR RADIOLOGIST EVAL & MGMT  09/23/2020   IR RADIOLOGIST EVAL & MGMT  12/15/2020   IR RADIOLOGIST EVAL & MGMT  02/23/2021    IR RENAL SUPRASEL UNI S&I MOD SED  11/18/2020   IR US GUIDE VASC ACCESS RIGHT  11/18/2020   LAMINECTOMY     LEFT ATRIAL APPENDAGE OCCLUSION     device implanted 03/25/20 at [place]  2000's   with cholecystectomy procedure   PILONIDAL CYST EXCISION     PUBOVAGINAL SLING  09/03/2012   Procedure: Gaynelle Arabian;  Surgeon: Ailene Rud, MD;  Location[facility];  Service: Urology;  Laterality: N/A;  LYNX SLING    RADIOLOGY WITH ANESTHESIA Right 11/18/2020   Procedure: IR WITH ANESTHESIA RENAL CRYOABLATION;  Surgeon: Arne Cleveland, MD;  [facility];  Service: Radiology;  Laterality: Right;    MEDICATIONS:  acetaminophen (TYLENOL) 500 MG tablet   aspirin EC 81 MG tablet   cetirizine (ZYRTEC) 10 MG tablet   cholecalciferol (VITAMIN D) 1000 UNITS tablet   EPINEPHrine (EPIPEN) 0.3 mg/0.3 mL SOAJ injection   furosemide (LASIX) 20 MG tablet   gabapentin (NEURONTIN) 100 MG capsule   hydrocortisone 2.5 % cream   metoprolol succinate (TOPROL-XL) 50 MG 24 hr tablet   nitroGLYCERIN (NITROSTAT) 0.4 MG SL tablet   pantoprazole (PROTONIX) 40 MG tablet   phenylephrine-shark liver oil-mineral oil-petrolatum (PREPARATION H) 0.25-3-14-71.9 % rectal ointment   Polyethyl Glycol-Propyl Glycol (SYSTANE OP)   No current facility-administered medications for this encounter.  She was advised to follow-up with surgeon regarding perioperative ASA instructions.   Myra Gianotti, PA-C Surgical Short Stay/Anesthesiology [facility] Phone [phone] [facility] Phone [phone] 09/23/2021 6:13 PM

## 2021-09-23 NOTE — Research (Signed)
MTG-015 - Tissue and Bodily Fluids: Translational Medicine: Discovery and Evaluation of Biomarkers/Pharmacogenomics for the Diagnosis and Personalized Management of Patients    Dr. Lindi Adie referred the pt for this study.  The nurse went to meet with the pt to discuss the study.  The pt said that she wanted her blood drawn this afternoon.  The lab informed the research team that it was too late in the afternoon to have her blood drawn and processed for the study.  The pt was offered another date/time to consent and participate in the study before her surgery on Tuesday, but the pt declined.  She said that it was going to rain tomorrow, and she did not want to come to the [facility] tomorrow to consent and have her blood drawn.  The pt stated Monday she was busy with another appointment.  The pt was thanked for her consideration of this study. She asked the nurse to consider her for more studies if she is eligible in the future.  The nurse wished the pt well with her surgery on Tuesday. The pt will not enroll into this study.  Brion Aliment RN, BSN, CCRP Clinical Research Nurse Lead 09/23/2021 4:33 PM   .

## 2021-09-23 NOTE — Assessment & Plan Note (Signed)
08/20/2021:Screening mammogram: right breast calcifications. Diagnostic mammogram: Indeterminate 1.2 cm group of retroareolar right breast calcifications. Biopsy: intraductal papilloma with DCIS, ER+(100%)/PR+(20%).   Pathology review: I discussed with the patient the difference between DCIS and invasive breast cancer. It is considered a precancerous lesion. DCIS is classified as a 0. It is generally detected through mammograms as calcifications. We discussed the significance of grades and its impact on prognosis. We also discussed the importance of ER and PR receptors and their implications to adjuvant treatment options. Prognosis of DCIS dependence on grade, comedo necrosis. It is anticipated that if not treated, 20-30% of DCIS can develop into invasive breast cancer.  Recommendation: 1. Breast conserving surgery 2. Followed by adjuvant radiation therapy 3. Followed by antiestrogen therapy with tamoxifen 5 years  Tamoxifen counseling: We discussed the risks and benefits of tamoxifen. These include but not limited to insomnia, hot flashes, mood changes, vaginal dryness, and weight gain. Although rare, serious side effects including endometrial cancer, risk of blood clots were also discussed. We strongly believe that the benefits far outweigh the risks. Patient understands these risks and consented to starting treatment. Planned treatment duration is 5 years.  Return to clinic after surgery to discuss the final pathology report and come up with an adjuvant treatment plan.

## 2021-09-23 NOTE — Anesthesia Preprocedure Evaluation (Addendum)
Anesthesia Evaluation  Patient identified by MRN, date of birth, ID band Patient awake    Reviewed: Allergy & Precautions, H&P , NPO status , Patient's Chart, lab work & pertinent test results, reviewed documented beta blocker date and time   Airway Mallampati: II  TM Distance: >3 FB Neck ROM: full    Dental no notable dental hx. (+) Teeth Intact, Dental Advisory Given   Pulmonary neg pulmonary ROS,    Pulmonary exam normal breath sounds clear to auscultation       Cardiovascular Exercise Tolerance: Good hypertension, Pt. on medications and Pt. on home beta blockers + dysrhythmias Atrial Fibrillation  Rhythm:regular Rate:Normal  Nuclear stress test 09/14/21:  Findings are consistent with no prior ischemia and no prior myocardial infarction. The study is low risk.  No ST deviation was noted.  LV perfusion is normal. There is no evidence of ischemia. There is no evidence of infarction.  Prior study available for comparison from 04/08/2019.  Study was not gated due to frequent PACs.   Echo 09/20/21: IMPRESSIONS 1. Left ventricular ejection fraction, by estimation, is 60 to 65%. The  left ventricle has normal function. The left ventricle has no regional  wall motion abnormalities. There is mild left ventricular hypertrophy.  Left ventricular diastolic parameters  are consistent with Grade I diastolic dysfunction (impaired relaxation).  2. Right ventricular systolic function is normal. The right ventricular  size is mildly enlarged.  3. The mitral valve is normal in structure. Mild to moderate mitral valve  regurgitation. No evidence of mitral stenosis.  4. Tricuspid valve regurgitation is mild to moderate.  5. The aortic valve is normal in structure. Aortic valve regurgitation is  not visualized. No aortic stenosis is present.  6. The inferior vena cava is normal in size with greater than 50%  respiratory  variability, suggesting right atrial pressure of 3 mmHg.        Neuro/Psych CVA negative psych ROS   GI/Hepatic Neg liver ROS, hiatal hernia,   Endo/Other  negative endocrine ROS  Renal/GU negative Renal ROS  negative genitourinary   Musculoskeletal   Abdominal   Peds  Hematology negative hematology ROS (+)   Anesthesia Other Findings   Reproductive/Obstetrics negative OB ROS                            Anesthesia Physical Anesthesia Plan  ASA: 3  Anesthesia Plan: General   Post-op Pain Management: Minimal or no pain anticipated   Induction: Intravenous  PONV Risk Score and Plan: 3 and Ondansetron, Dexamethasone and Treatment may vary due to age or medical condition  Airway Management Planned: LMA  Additional Equipment: None  Intra-op Plan:   Post-operative Plan: Extubation in OR  Informed Consent: I have reviewed the patients History and Physical, chart, labs and discussed the procedure including the risks, benefits and alternatives for the proposed anesthesia with the patient or authorized representative who has indicated his/her understanding and acceptance.     Dental Advisory Given  Plan Discussed with: CRNA and Anesthesiologist  Anesthesia Plan Comments: (PAT note written 09/23/2021 by Myra Gianotti, PA-C. Patient is an 81 year old female scheduled for the above procedure.  History includes never smoker, colon cancer (s/p colon resection 1994), HTN, PAF (Watchman Device 03/25/20 by Dr. 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    Last cardiology evaluation by Dr. Agustin Cree on 09/08/21. She told him about need for breast surgery. Also reported from [place] and atypical chest pain, so he ordered a stress test and echocardiogram. Her 09/20/21 echo showed LVEF 60-65%, mild LVH, grade 1 DD, mildly enlarged RV, mild-moderate MR, mild-moderate TR. 09/14/21 stress test showed no evidence of ischemia or prior infarct. Study was not gated due to frequent PACs.)      Anesthesia Quick Evaluation

## 2021-09-24 ENCOUNTER — Encounter: Payer: Self-pay | Admitting: *Deleted

## 2021-09-24 ENCOUNTER — Telehealth: Payer: Self-pay | Admitting: Hematology and Oncology

## 2021-09-24 DIAGNOSIS — D0511 Intraductal carcinoma in situ of right breast: Secondary | ICD-10-CM

## 2021-09-24 NOTE — Telephone Encounter (Signed)
Sch per 2/17 inbasket, pt aware °

## 2021-09-27 ENCOUNTER — Ambulatory Visit
Admission: RE | Admit: 2021-09-27 | Discharge: 2021-09-27 | Disposition: A | Discharge status: Home / Self Care | Payer: Medicare PPO | Source: Ambulatory Visit | Attending: Surgery | Admitting: Surgery

## 2021-09-27 ENCOUNTER — Encounter: Payer: Self-pay | Admitting: *Deleted

## 2021-09-27 DIAGNOSIS — D0511 Intraductal carcinoma in situ of right breast: Secondary | ICD-10-CM

## 2021-09-27 DIAGNOSIS — R928 Other abnormal and inconclusive findings on diagnostic imaging of breast: Secondary | ICD-10-CM | POA: Diagnosis not present

## 2021-09-28 ENCOUNTER — Ambulatory Visit
Admission: RE | Admit: 2021-09-28 | Discharge: 2021-09-28 | Disposition: A | Discharge status: Home / Self Care | Payer: Medicare PPO | Source: Ambulatory Visit | Attending: Surgery | Admitting: Surgery

## 2021-09-28 ENCOUNTER — Ambulatory Visit (HOSPITAL_COMMUNITY)
Admission: RE | Admit: 2021-09-28 | Discharge: 2021-09-28 | Disposition: A | Discharge status: Home / Self Care | Payer: Medicare PPO | Attending: Surgery | Admitting: Surgery

## 2021-09-28 ENCOUNTER — Ambulatory Visit (HOSPITAL_BASED_OUTPATIENT_CLINIC_OR_DEPARTMENT_OTHER): Payer: Medicare PPO | Admitting: Anesthesiology

## 2021-09-28 ENCOUNTER — Other Ambulatory Visit: Payer: Self-pay

## 2021-09-28 ENCOUNTER — Encounter (HOSPITAL_COMMUNITY)
Admission: RE | Disposition: A | Discharge status: Home / Self Care | Payer: Self-pay | Source: Home / Self Care | Attending: Surgery

## 2021-09-28 ENCOUNTER — Ambulatory Visit (HOSPITAL_COMMUNITY): Payer: Medicare PPO | Admitting: Vascular Surgery

## 2021-09-28 ENCOUNTER — Encounter (HOSPITAL_COMMUNITY): Payer: Self-pay | Admitting: Surgery

## 2021-09-28 DIAGNOSIS — Z85038 Personal history of other malignant neoplasm of large intestine: Secondary | ICD-10-CM | POA: Insufficient documentation

## 2021-09-28 DIAGNOSIS — Z170 Estrogen receptor positive status [ER+]: Secondary | ICD-10-CM | POA: Diagnosis not present

## 2021-09-28 DIAGNOSIS — Z79899 Other long term (current) drug therapy: Secondary | ICD-10-CM | POA: Insufficient documentation

## 2021-09-28 DIAGNOSIS — D0511 Intraductal carcinoma in situ of right breast: Secondary | ICD-10-CM

## 2021-09-28 DIAGNOSIS — I4891 Unspecified atrial fibrillation: Secondary | ICD-10-CM | POA: Diagnosis not present

## 2021-09-28 DIAGNOSIS — K219 Gastro-esophageal reflux disease without esophagitis: Secondary | ICD-10-CM | POA: Diagnosis not present

## 2021-09-28 DIAGNOSIS — Z9049 Acquired absence of other specified parts of digestive tract: Secondary | ICD-10-CM | POA: Insufficient documentation

## 2021-09-28 DIAGNOSIS — I480 Paroxysmal atrial fibrillation: Secondary | ICD-10-CM | POA: Insufficient documentation

## 2021-09-28 DIAGNOSIS — Z8673 Personal history of transient ischemic attack (TIA), and cerebral infarction without residual deficits: Secondary | ICD-10-CM

## 2021-09-28 DIAGNOSIS — I10 Essential (primary) hypertension: Secondary | ICD-10-CM | POA: Diagnosis not present

## 2021-09-28 DIAGNOSIS — N6011 Diffuse cystic mastopathy of right breast: Secondary | ICD-10-CM | POA: Diagnosis not present

## 2021-09-28 DIAGNOSIS — R928 Other abnormal and inconclusive findings on diagnostic imaging of breast: Secondary | ICD-10-CM | POA: Diagnosis not present

## 2021-09-28 HISTORY — PX: BREAST LUMPECTOMY WITH RADIOACTIVE SEED LOCALIZATION: SHX6424

## 2021-09-28 IMAGING — MG MM BREAST SURGICAL SPECIMEN
1 series · 1 of 1 positions shown · non-contrast
Comparison: Previous exam(s).

CLINICAL DATA: Post lumpectomy specimen radiograph

EXAM:
SPECIMEN RADIOGRAPH OF THE RIGHT BREAST

[R]
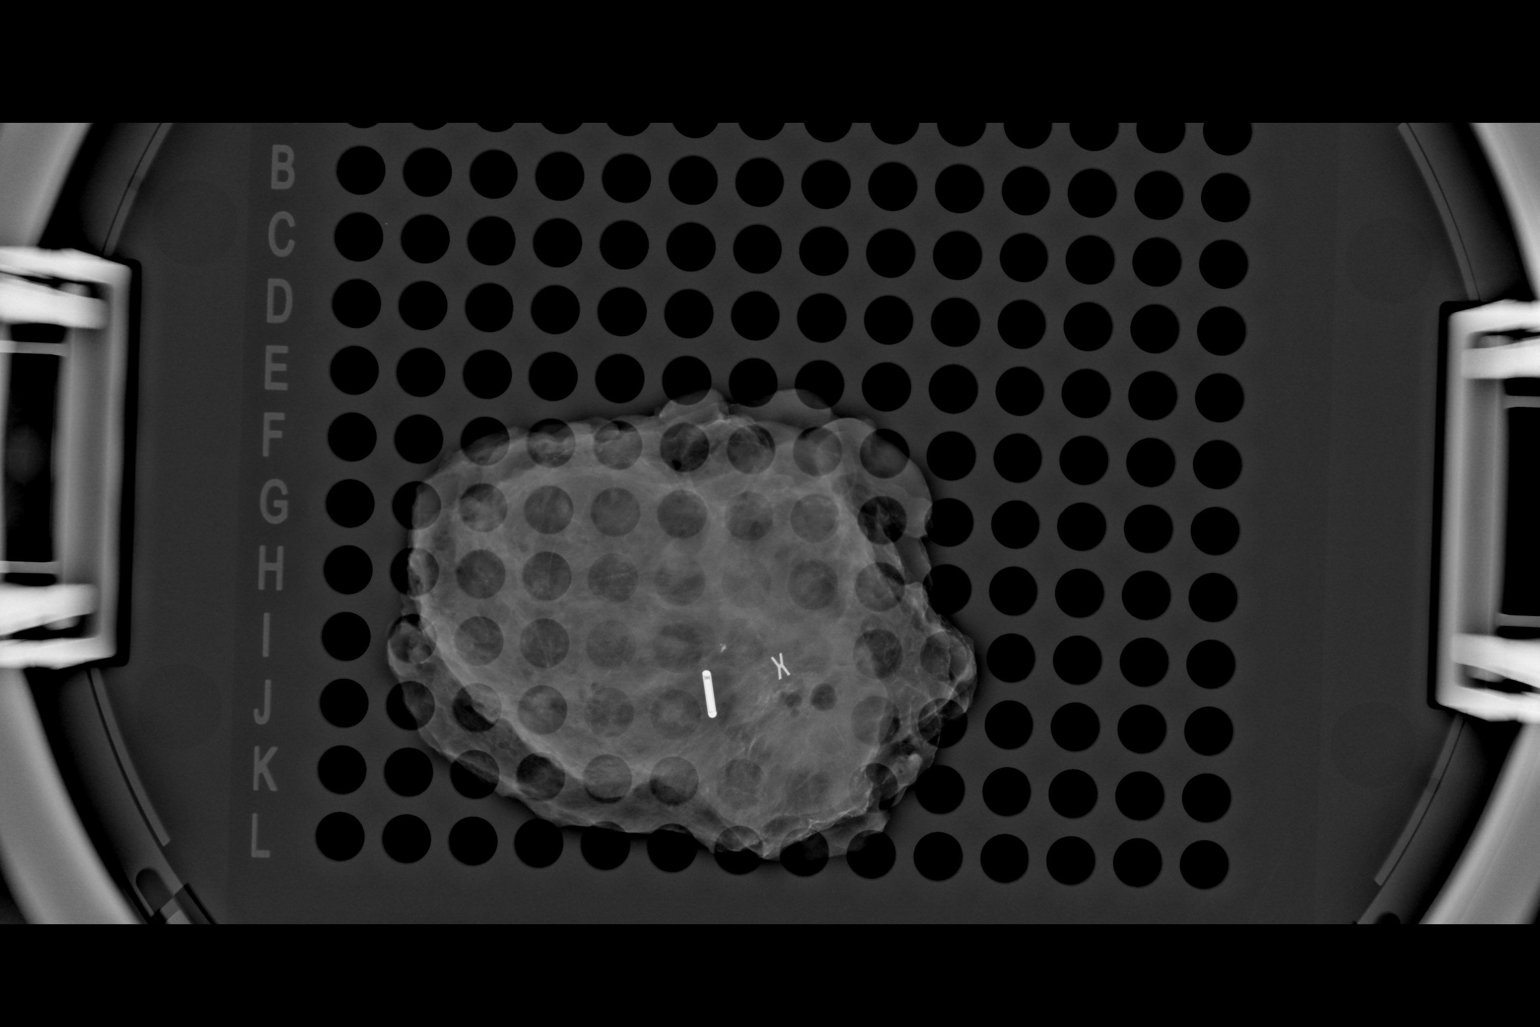

[1 of 1 positions shown; findings below may reference images not displayed]

FINDINGS: Status post excision of the right breast. The radioactive seed and
biopsy marker clip are present, completely intact, and were marked
for pathology.
IMPRESSION: Specimen radiograph of the right breast.

These results were called by telephone at the time of interpretation
on [DATE] at [DATE] to provider KULP , who verbally
acknowledged these results.

## 2021-09-28 SURGERY — BREAST LUMPECTOMY WITH RADIOACTIVE SEED LOCALIZATION
Anesthesia: General | Site: Breast | Laterality: Right

## 2021-09-28 MED ORDER — PROPOFOL 10 MG/ML IV BOLUS
INTRAVENOUS | Status: DC | PRN
Start: 2021-09-28 — End: 2021-09-28
  Administered 2021-09-28: 100 mg via INTRAVENOUS
  Administered 2021-09-28: 50 mg via INTRAVENOUS

## 2021-09-28 MED ORDER — LIDOCAINE 2% (20 MG/ML) 5 ML SYRINGE
INTRAMUSCULAR | Status: DC | PRN
  Administered 2021-09-28: 60 mg via INTRAVENOUS

## 2021-09-28 MED ORDER — EPHEDRINE SULFATE-NACL 50-0.9 MG/10ML-% IV SOSY
PREFILLED_SYRINGE | INTRAVENOUS | Status: DC | PRN
  Administered 2021-09-28 (×2): 5 mg via INTRAVENOUS

## 2021-09-28 MED ORDER — BUPIVACAINE-EPINEPHRINE (PF) 0.25% -1:200000 IJ SOLN
INTRAMUSCULAR | Status: AC
  Filled 2021-09-28: qty 30

## 2021-09-28 MED ORDER — ACETAMINOPHEN 500 MG PO TABS
ORAL_TABLET | ORAL | Status: AC
  Administered 2021-09-28: 1000 mg via ORAL
  Filled 2021-09-28: qty 2

## 2021-09-28 MED ORDER — CLINDAMYCIN PHOSPHATE 900 MG/50ML IV SOLN
900.0000 mg | INTRAVENOUS | Status: AC
  Administered 2021-09-28: 900 mg via INTRAVENOUS

## 2021-09-28 MED ORDER — ACETAMINOPHEN 325 MG PO TABS: 325.0000 mg | ORAL_TABLET | ORAL | Status: DC | PRN

## 2021-09-28 MED ORDER — OXYCODONE HCL 5 MG/5ML PO SOLN: 5.0000 mg | Freq: Once | ORAL | Status: DC | PRN

## 2021-09-28 MED ORDER — CHLORHEXIDINE GLUCONATE 0.12 % MT SOLN
OROMUCOSAL | Status: AC
  Administered 2021-09-28: 15 mL via OROMUCOSAL
  Filled 2021-09-28: qty 15

## 2021-09-28 MED ORDER — LACTATED RINGERS IV SOLN: INTRAVENOUS | Status: DC

## 2021-09-28 MED ORDER — BUPIVACAINE-EPINEPHRINE 0.25% -1:200000 IJ SOLN
INTRAMUSCULAR | Status: DC | PRN
  Administered 2021-09-28: 20 mL

## 2021-09-28 MED ORDER — PHENYLEPHRINE HCL-NACL 20-0.9 MG/250ML-% IV SOLN
INTRAVENOUS | Status: DC | PRN
  Administered 2021-09-28: 30 ug/min via INTRAVENOUS

## 2021-09-28 MED ORDER — CHLORHEXIDINE GLUCONATE CLOTH 2 % EX PADS: 6.0000 | MEDICATED_PAD | Freq: Once | CUTANEOUS | Status: DC

## 2021-09-28 MED ORDER — GLYCOPYRROLATE PF 0.2 MG/ML IJ SOSY
PREFILLED_SYRINGE | INTRAMUSCULAR | Status: AC
  Filled 2021-09-28: qty 1

## 2021-09-28 MED ORDER — GLYCOPYRROLATE PF 0.2 MG/ML IJ SOSY
PREFILLED_SYRINGE | INTRAMUSCULAR | Status: DC | PRN
  Administered 2021-09-28: .2 mg via INTRAVENOUS

## 2021-09-28 MED ORDER — DEXAMETHASONE SODIUM PHOSPHATE 10 MG/ML IJ SOLN
INTRAMUSCULAR | Status: DC | PRN
  Administered 2021-09-28: 10 mg via INTRAVENOUS

## 2021-09-28 MED ORDER — ACETAMINOPHEN 160 MG/5ML PO SOLN: 325.0000 mg | ORAL | Status: DC | PRN

## 2021-09-28 MED ORDER — ORAL CARE MOUTH RINSE: 15.0000 mL | Freq: Once | OROMUCOSAL | Status: AC

## 2021-09-28 MED ORDER — PHENYLEPHRINE 40 MCG/ML (10ML) SYRINGE FOR IV PUSH (FOR BLOOD PRESSURE SUPPORT)
PREFILLED_SYRINGE | INTRAVENOUS | Status: DC | PRN
  Administered 2021-09-28: 80 ug via INTRAVENOUS

## 2021-09-28 MED ORDER — FENTANYL CITRATE (PF) 250 MCG/5ML IJ SOLN
INTRAMUSCULAR | Status: DC | PRN
Start: 2021-09-28 — End: 2021-09-28
  Administered 2021-09-28: 50 ug via INTRAVENOUS

## 2021-09-28 MED ORDER — MEPERIDINE HCL 25 MG/ML IJ SOLN: 6.2500 mg | INTRAMUSCULAR | Status: DC | PRN

## 2021-09-28 MED ORDER — OXYCODONE HCL 5 MG PO TABS: 5.0000 mg | ORAL_TABLET | Freq: Four times a day (QID) | ORAL | 0 refills | Status: DC | PRN

## 2021-09-28 MED ORDER — FENTANYL CITRATE (PF) 250 MCG/5ML IJ SOLN
INTRAMUSCULAR | Status: AC
  Filled 2021-09-28: qty 5

## 2021-09-28 MED ORDER — ONDANSETRON HCL 4 MG/2ML IJ SOLN
INTRAMUSCULAR | Status: DC | PRN
Start: 2021-09-28 — End: 2021-09-28
  Administered 2021-09-28: 4 mg via INTRAVENOUS

## 2021-09-28 MED ORDER — CLINDAMYCIN PHOSPHATE 900 MG/50ML IV SOLN
INTRAVENOUS | Status: AC
Start: 2021-09-28 — End: 2021-09-28
  Filled 2021-09-28: qty 50

## 2021-09-28 MED ORDER — FENTANYL CITRATE (PF) 100 MCG/2ML IJ SOLN: 25.0000 ug | INTRAMUSCULAR | Status: DC | PRN

## 2021-09-28 MED ORDER — ACETAMINOPHEN 500 MG PO TABS: 1000.0000 mg | ORAL_TABLET | ORAL | Status: AC

## 2021-09-28 MED ORDER — CHLORHEXIDINE GLUCONATE 0.12 % MT SOLN: 15.0000 mL | Freq: Once | OROMUCOSAL | Status: AC

## 2021-09-28 MED ORDER — OXYCODONE HCL 5 MG PO TABS: 5.0000 mg | ORAL_TABLET | Freq: Once | ORAL | Status: DC | PRN

## 2021-09-28 MED ORDER — ONDANSETRON HCL 4 MG/2ML IJ SOLN: 4.0000 mg | Freq: Once | INTRAMUSCULAR | Status: DC | PRN

## 2021-09-28 MED ORDER — 0.9 % SODIUM CHLORIDE (POUR BTL) OPTIME
TOPICAL | Status: DC | PRN
  Administered 2021-09-28: 1000 mL

## 2021-09-28 SURGICAL SUPPLY — 41 items
ADH SKN CLS APL DERMABOND .7 (GAUZE/BANDAGES/DRESSINGS) ×1
APL PRP STRL LF DISP 70% ISPRP (MISCELLANEOUS) ×1
APPLIER CLIP 9.375 MED OPEN (MISCELLANEOUS)
APR CLP MED 9.3 20 MLT OPN (MISCELLANEOUS)
BAG COUNTER SPONGE SURGICOUNT (BAG) ×2 IMPLANT
BAG SPNG CNTER NS LX DISP (BAG) ×1
BINDER BREAST XLRG (GAUZE/BANDAGES/DRESSINGS) ×1 IMPLANT
CANISTER SUCT 3000ML PPV (MISCELLANEOUS) IMPLANT
CHLORAPREP W/TINT 26 (MISCELLANEOUS) ×2 IMPLANT
CLIP APPLIE 9.375 MED OPEN (MISCELLANEOUS) IMPLANT
COVER PROBE W GEL 5X96 (DRAPES) ×2 IMPLANT
COVER SURGICAL LIGHT HANDLE (MISCELLANEOUS) ×2 IMPLANT
DERMABOND ADVANCED (GAUZE/BANDAGES/DRESSINGS) ×1
DERMABOND ADVANCED .7 DNX12 (GAUZE/BANDAGES/DRESSINGS) ×1 IMPLANT
DEVICE DUBIN SPECIMEN MAMMOGRA (MISCELLANEOUS) ×2 IMPLANT
DRAPE CHEST BREAST 15X10 FENES (DRAPES) ×2 IMPLANT
ELECT CAUTERY BLADE 6.4 (BLADE) ×2 IMPLANT
ELECT REM PT RETURN 9FT ADLT (ELECTROSURGICAL) ×2
ELECTRODE REM PT RTRN 9FT ADLT (ELECTROSURGICAL) ×1 IMPLANT
GLOVE SRG 8 PF TXTR STRL LF DI (GLOVE) ×1 IMPLANT
GLOVE SURG ENC MOIS LTX SZ8 (GLOVE) ×2 IMPLANT
GLOVE SURG UNDER POLY LF SZ8 (GLOVE) ×2
GOWN STRL REUS W/ TWL LRG LVL3 (GOWN DISPOSABLE) ×1 IMPLANT
GOWN STRL REUS W/ TWL XL LVL3 (GOWN DISPOSABLE) ×1 IMPLANT
GOWN STRL REUS W/TWL LRG LVL3 (GOWN DISPOSABLE) ×2
GOWN STRL REUS W/TWL XL LVL3 (GOWN DISPOSABLE) ×2
KIT BASIN OR (CUSTOM PROCEDURE TRAY) ×2 IMPLANT
KIT MARKER MARGIN INK (KITS) ×1 IMPLANT
LIGHT WAVEGUIDE WIDE FLAT (MISCELLANEOUS) IMPLANT
NDL HYPO 25GX1X1/2 BEV (NEEDLE) IMPLANT
NEEDLE HYPO 25GX1X1/2 BEV (NEEDLE) ×2 IMPLANT
NS IRRIG 1000ML POUR BTL (IV SOLUTION) IMPLANT
PACK GENERAL/GYN (CUSTOM PROCEDURE TRAY) ×2 IMPLANT
SUT MNCRL AB 4-0 PS2 18 (SUTURE) ×2 IMPLANT
SUT VIC AB 2-0 SH 27 (SUTURE) ×2
SUT VIC AB 2-0 SH 27XBRD (SUTURE) IMPLANT
SUT VIC AB 3-0 SH 27 (SUTURE) ×2
SUT VIC AB 3-0 SH 27X BRD (SUTURE) IMPLANT
SUT VIC AB 3-0 SH 8-18 (SUTURE) ×1 IMPLANT
SYR CONTROL 10ML LL (SYRINGE) ×1 IMPLANT
TOWEL GREEN STERILE FF (TOWEL DISPOSABLE) IMPLANT

## 2021-09-28 NOTE — Transfer of Care (Signed)
Immediate Anesthesia Transfer of Care Note  Patient: Kimberly Gay  Procedure(s) Performed: RIGHT BREAST LUMPECTOMY WITH RADIOACTIVE SEED LOCALIZATION (Right: Breast)  Patient Location: PACU  Anesthesia Type:General  Level of Consciousness: drowsy  Airway & Oxygen Therapy: Patient Spontanous Breathing and Patient connected to face mask oxygen  Post-op Assessment: Report given to RN and Post -op Vital signs reviewed and stable  Post vital signs: Reviewed and stable  Last Vitals:  Vitals Value Taken Time  BP 137/62 09/28/21 1058  Temp    Pulse 80 09/28/21 1058  Resp 16 09/28/21 1058  SpO2 100 % 09/28/21 1058  Vitals shown include unvalidated device data.  Last Pain:  Vitals:   09/28/21 0802  TempSrc:   PainSc: 0-No pain      Patients Stated Pain Goal: 0 (23/41/44 3601)  Complications: No notable events documented.

## 2021-09-28 NOTE — Anesthesia Procedure Notes (Signed)
Procedure Name: LMA Insertion Date/Time: 09/28/2021 10:03 AM Performed by: Terrence Dupont, RN Pre-anesthesia Checklist: Patient identified, Emergency Drugs available, Suction available and Patient being monitored Patient Re-evaluated:Patient Re-evaluated prior to induction Oxygen Delivery Method: Circle System Utilized Preoxygenation: Pre-oxygenation with 100% oxygen Induction Type: IV induction Ventilation: Mask ventilation without difficulty LMA: LMA inserted LMA Size: 4.0 Number of attempts: 1 Airway Equipment and Method: Bite block Placement Confirmation: positive ETCO2, CO2 detector and breath sounds checked- equal and bilateral Tube secured with: Tape Dental Injury: Teeth and Oropharynx as per pre-operative assessment

## 2021-09-28 NOTE — Interval H&P Note (Signed)
History and Physical Interval Note:  09/28/2021 9:22 AM  Kimberly Gay  has presented today for surgery, with the diagnosis of RIGHT BREAST DCIS.  The various methods of treatment have been discussed with the patient and family. After consideration of risks, benefits and other options for treatment, the patient has consented to  Procedure(s): RIGHT BREAST LUMPECTOMY WITH RADIOACTIVE SEED LOCALIZATION (Right) as a surgical intervention.  The patient's history has been reviewed, patient examined, no change in status, stable for surgery.  I have reviewed the patient's chart and labs.  Questions were answered to the patient's satisfaction.   The procedure has been discussed with the patient. Alternatives to surgery have been discussed with the patient.  Risks of surgery include bleeding,  Infection,  Seroma formation, death,  and the need for further surgery.   The patient understands and wishes to proceed.   [place]

## 2021-09-28 NOTE — H&P (Signed)
History of Present Illness: Kimberly Gay is a 81 y.o. female who is seen today as an office consultation at the request of Dr. Radene Ou for evaluation of Breast Cancer .   Patient sent after abnormal mammogram revealed a density right breast central location subareolar. Core biopsy showed papilloma with DCIS. No history of breast mass breast pain or discharge. She has had issues with back pain and was treated for a UTI which seemed to help that. Also complains of fatigue.  Review of Systems: A complete review of systems was obtained from the patient. I have reviewed this information and discussed as appropriate with the patient. See HPI as well for other ROS.    Medical History: Past Medical History:  Diagnosis Date   Aneurysm (CMS-HCC)   Arthritis   GERD (gastroesophageal reflux disease)   History of cancer   History of stroke   Hypertension   There is no problem list on file for this patient.  Past Surgical History:  Procedure Laterality Date   aneurysm coiling   CHOLECYSTECTOMY   COLON SURGERY   HYSTERECTOMY    Allergies  Allergen Reactions   Amoxicillin Angioedema, Other (See Comments) and Swelling  Other reaction(s): Angioedema (ALLERGY/intolerance)   Benadryl [Diphenhydramine Hcl] Other (See Comments)   Current Outpatient Medications on File Prior to Visit  Medication Sig Dispense Refill   acetaminophen (TYLENOL) 500 MG tablet Take 1,000 mg by mouth every 6 (six) hours as needed   aspirin 81 MG EC tablet Take 81 mg by mouth once daily   cetirizine (ZYRTEC) 10 MG tablet Take by mouth   cholecalciferol (VITAMIN D3) 1000 unit tablet Take by mouth   EPINEPHrine (EPIPEN) 0.3 mg/0.3 mL auto-injector .   FUROsemide (LASIX) 10 mg/mL solution Take by mouth once daily   gabapentin (NEURONTIN) 100 MG capsule 1 capsule   metoprolol succinate (TOPROL-XL) 100 MG XL tablet TAKE 1 TABLET BY MOUTH DAILY. TAKE WITH OR IMMEDIATELY FOLLOWING A MEAL.   nitroGLYcerin (NITROSTAT) 0.4 MG  SL tablet as directed   pantoprazole (PROTONIX) 40 MG DR tablet   No current facility-administered medications on file prior to visit.   Family History  Problem Relation Age of Onset   Skin cancer Mother   Deep vein thrombosis (DVT or abnormal blood clot formation) Father   Myocardial Infarction (Heart attack) Father   Stroke Sister   Obesity Sister   Diabetes Sister   Deep vein thrombosis (DVT or abnormal blood clot formation) Sister   Diabetes Brother    Social History   Tobacco Use  Smoking Status Never  Smokeless Tobacco Never    Social History   Socioeconomic History   Marital status: Widowed  Tobacco Use   Smoking status: Never   Smokeless tobacco: Never   Objective:   Vitals:  08/27/21 1011  BP: 130/82  Weight: 81.2 kg (179 lb)  Height: 165.1 cm (5\' 5" )   Body mass index is 29.79 kg/m.  Physical Exam Constitutional:  Appearance: Normal appearance.  HENT:  Head: Normocephalic.  Eyes:  General: No scleral icterus. Pupils: Pupils are equal, round, and reactive to light.  Cardiovascular:  Comments: Atrial fibrillation rate controlled Pulmonary:  Effort: Pulmonary effort is normal.  Chest:  Breasts: Left: Normal.   Musculoskeletal:  Cervical back: Normal range of motion.  Lymphadenopathy:  Upper Body:  Right upper body: No supraclavicular or axillary adenopathy.  Left upper body: No supraclavicular or axillary adenopathy.  Skin: General: Skin is warm and dry.  Neurological:  General: No  focal deficit present.  Mental Status: She is alert and oriented to person, place, and time.  Psychiatric:  Mood and Affect: Mood normal.  Behavior: Behavior normal.     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 SEE COMMENT. 1 of 3 Amended copy Amended FINAL for Mahr, Mollyann A ([id number]) Microscopic Comment Material will be taken for receptor studies and will be reported in an addendum. Dr. Alric Seton has reviewed the case and concurs with the diagnosis. (MJ:kh 08/23/21) ADDENDUM: Calcifications are present within the lesion. (MJ:kh 08/24/21) Mark Martinique MD Pathologist, Electronic Signature (Case signed 08/23/2021) Corrected Report Signer Mark Martinique MD Pathologist, Electronic Signature (Case signed 08/25/2021) Specimen Gross and Clinical Information Specimen Comment TIF: 1110 am CIT: < 5 min, calcs Specimen(s) Obtained: Breast, right, needle core biopsy, retroareolar Specimen Clinical  Assessment and Plan:   Diagnoses and all orders for this visit:  Ductal carcinoma in situ (DCIS) of right breast  Discussed treatment options of lumpectomy versus mastectomy. This appears to be an early lesion she is a good lumpectomy candidate which is what she would like to do. Discussed the natural history of DCIS. There are no open studies so she cannot be enrolled in a clinical study at this point in time. Recommend lumpectomy seed localized right breast. Refer to medical and radiation oncology.The procedure has been discussed with the patient. Alternatives to surgery have been discussed with the patient. Risks of surgery include bleeding, Infection, Seroma formation, death, and the need for further surgery. The patient understands and wishes to proceed. No follow-ups on file.  Kennieth Francois, MD

## 2021-09-28 NOTE — Anesthesia Postprocedure Evaluation (Signed)
Anesthesia Post Note  Patient: Kimberly Gay  Procedure(s) Performed: RIGHT BREAST LUMPECTOMY WITH RADIOACTIVE SEED LOCALIZATION (Right: Breast)     Patient location during evaluation: PACU Anesthesia Type: General Level of consciousness: awake and alert Pain management: pain level controlled Vital Signs Assessment: post-procedure vital signs reviewed and stable Respiratory status: spontaneous breathing, nonlabored ventilation, respiratory function stable and patient connected to nasal cannula oxygen Cardiovascular status: blood pressure returned to baseline and stable Postop Assessment: no apparent nausea or vomiting Anesthetic complications: no   No notable events documented.  Last Vitals:  Vitals:   09/28/21 1115 09/28/21 1130  BP: 133/64 138/65  Pulse: 67 67  Resp: 15 14  Temp:  36.4 C  SpO2: 95% 99%    Last Pain:  Vitals:   09/28/21 1130  TempSrc:   PainSc: 0-No pain                 Kamdyn Covel

## 2021-09-28 NOTE — Discharge Instructions (Signed)
[facility] Office Phone Number [phone]  BREAST BIOPSY/ PARTIAL MASTECTOMY: POST OP INSTRUCTIONS  Always review your discharge instruction sheet given to you by the facility where your surgery was performed.  IF YOU HAVE DISABILITY OR FAMILY LEAVE FORMS, YOU MUST BRING THEM TO THE OFFICE FOR PROCESSING.  DO NOT GIVE THEM TO YOUR DOCTOR.  A prescription for pain medication may be given to you upon discharge.  Take your pain medication as prescribed, if needed.  If narcotic pain medicine is not needed, then you may take acetaminophen (Tylenol) or ibuprofen (Advil) as needed. Take your usually prescribed medications unless otherwise directed If you need a refill on your pain medication, please contact your pharmacy.  They will contact our office to request authorization.  Prescriptions will not be filled after 5pm or on week-ends. You should eat very light the first 24 hours after surgery, such as soup, crackers, pudding, etc.  Resume your normal diet the day after surgery. Most patients will experience some swelling and bruising in the breast.  Ice packs and a good support bra will help.  Swelling and bruising can take several days to resolve.  It is common to experience some constipation if taking pain medication after surgery.  Increasing fluid intake and taking a stool softener will usually help or prevent this problem from occurring.  A mild laxative (Milk of Magnesia or Miralax) should be taken according to package directions if there are no bowel movements after 48 hours. Unless discharge instructions indicate otherwise, you may remove your bandages 24-48 hours after surgery, and you may shower at that time.  You may have steri-strips (small skin tapes) in place directly over the incision.  These strips should be left on the skin for 7-10 days.  If your surgeon used skin glue on the incision, you may shower in 24 hours.  The glue will flake off over the next 2-3 weeks.  Any  sutures or staples will be removed at the office during your follow-up visit. ACTIVITIES:  You may resume regular daily activities (gradually increasing) beginning the next day.  Wearing a good support bra or sports bra minimizes pain and swelling.  You may have sexual intercourse when it is comfortable. You may drive when you no longer are taking prescription pain medication, you can comfortably wear a seatbelt, and you can safely maneuver your car and apply brakes. RETURN TO WORK:  ______________________________________________________________________________________ You should see your doctor in the office for a follow-up appointment approximately two weeks after your surgery.  Your doctor's nurse will typically make your follow-up appointment when she calls you with your pathology report.  Expect your pathology report 2-3 business days after your surgery.  You may call to check if you do not hear from us after three days. OTHER INSTRUCTIONS: _______________________________________________________________________________________________ _____________________________________________________________________________________________________________________________________ _____________________________________________________________________________________________________________________________________ _____________________________________________________________________________________________________________________________________  WHEN TO CALL YOUR DOCTOR: Fever over 101.0 Nausea and/or vomiting. Extreme swelling or bruising. Continued bleeding from incision. Increased pain, redness, or drainage from the incision.  The clinic staff is available to answer your questions during regular business hours.  Please don't hesitate to call and ask to speak to one of the nurses for clinical concerns.  If you have a medical emergency, go to the nearest emergency room or call 911.  A surgeon from [facility] is always on call at the hospital.  For further questions, please visit [email or web address]   

## 2021-09-28 NOTE — Op Note (Signed)
Preoperative diagnosis: Right breast DCIS overlapping sites  Postop diagnosis: Same  Procedure: Right breast seed localized lumpectomy  Surgeon: Erroll Luna, MD  Anesthesia: LMA with 0.25% Marcaine plain  EBL: Minimal  Specimen: Right breast tissue with seed and clip verified by Faxitron.  All margins were excised on the cavity and oriented with ink.  Everything was sent separately.  Indications for procedure: The patient is a-year-old female found to have a mammographic abnormality on her most recent screening mammogram.  This prompted a diagnostic mammo and biopsy.  Area of DCIS was identified behind the right nipple-areolar complex measuring about 1.2 cm.  She opted for breast conserving surgery after being seen by medical and radiation oncology.The procedure has been discussed with the patient. Alternatives to surgery have been discussed with the patient.  Risks of surgery include bleeding,  Infection,  Seroma formation, death,  and the need for further surgery.   The patient understands and wishes to proceed.    Description of procedure: The patient was met in the holding area and questions were answered.  Of note she had a seed placed the radiologist yesterday.  She was then taken back to the operating room.  Timeout was performed after induction of general anesthesia.  Of note the right side was marked as correct site and this was verified.  After performance of timeout neoprobe was used to identify the seed posterior to the right nipple-areolar complex.  This was marked with a marking pen.  Local anesthetic was infiltrated along the inferior border of the nipple-areolar complex.  Incision was made.  Dissection was carried down all tissue and the seed and clip were excised with a grossly negative margin.  The Faxitron image revealed the seed and clip to be present in the specimen.  I opted to excise all the margins and sent the separately.  All tissue was oriented with ink and fixated.   Irrigation was used.  Clips were placed to mark the cavity.  3-0 Vicryl was used to approximate the deep tissue layers.  4 Monocryl used to close skin in a subcuticular fashion.  Dermabond applied.  All counts found to be correct.  Breast binder placed.  Patient was awoke extubated taken to recovery in satisfactory condition.

## 2021-09-29 ENCOUNTER — Encounter (HOSPITAL_COMMUNITY): Payer: Self-pay | Admitting: Surgery

## 2021-09-29 ENCOUNTER — Telehealth: Payer: Self-pay | Admitting: Interventional Radiology

## 2021-09-29 NOTE — Telephone Encounter (Signed)
Kimberly Gay is a 81 y.o. female with remote history of colon cancer s/p resection, presented with RUQ pain in January, 2022.  MRI of abdomen subsequently showed a solid right renal mass. She underwent cryoablation of this mass on 11/18/2020.  The procedure was complicated by rapidly enlarging hematoma which required angiogram and embolization.  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  She had a MRI of the abdomen performed on 02/19/2021 which showed no evidence of recurrent tumor or new renal masses.  She has been having intermittent right chest and back pain along her bra line.  She states that the pain started before the cryoablation procedure and stopped after the cryoablation.  The pain has recently returned but stopped on its own.  MRI of the abdomen was performed on 09/07/2021 and showed expected ablation changes at the lower pole of the right kidney with no evidence of recurrent or new tumor.  I spoke with her on the phone on 09/27/2021.  She stated that the pain was no currently present and she did not have any hematuria.  I discussed with her the findings of the most recent MRI.  I told her that I do not believe that this pain is related to her kidney.  It is most likely musculoskeletal or GI related.  I recommended to her that she follow up with her primary care or GI physician.  I would like to see her back in October, 2023 for her 18 month follow up.  We will repeat a MRI at that time.  She understood all the information we discussed and all her questions were answered.  [place], MD [phone]

## 2021-10-05 ENCOUNTER — Encounter: Payer: Self-pay | Admitting: *Deleted

## 2021-10-06 ENCOUNTER — Telehealth: Payer: Self-pay | Admitting: *Deleted

## 2021-10-06 ENCOUNTER — Encounter: Payer: Self-pay | Admitting: *Deleted

## 2021-10-06 LAB — SURGICAL PATHOLOGY

## 2021-10-06 NOTE — Progress Notes (Signed)
? ?Patient Care Team: ?Rankins, Bill Salinas, MD as PCP - General (Family Medicine) ?Park Liter, MD as PCP - Cardiology (Cardiology) ?Rockwell Germany, RN as Oncology Nurse Navigator ?Mauro Kaufmann, RN as Oncology Nurse Navigator ?Nicholas Lose, MD as Consulting Physician (Hematology and Oncology) ? ?DIAGNOSIS:  ?  ICD-10-CM   ?1. Ductal carcinoma in situ (DCIS) of right breast  D05.11   ?  ? ? ?SUMMARY OF ONCOLOGIC HISTORY: ?Oncology History  ?Ductal carcinoma in situ (DCIS) of right breast  ?08/20/2021 Initial Diagnosis  ? Screening mammogram: right breast calcifications. Diagnostic mammogram: Indeterminate 1.2 cm group of retroareolar right breast calcifications. Biopsy: intraductal papilloma with DCIS, ER+(100%)/PR+(20%).  ?  ?09/23/2021 Cancer Staging  ? Staging form: Breast, AJCC 8th Edition ?- Clinical stage from 09/23/2021: Stage 0 (cTis (DCIS), cN0, cM0, GX, ER+, PR+) - Signed by Nicholas Lose, MD on 09/23/2021 ?Histologic grading system: 3 grade system ? ?  ? ? ?CHIEF COMPLIANT: Follow-up of right breast cancer ? ?INTERVAL HISTORY: Kimberly Gay is a 81 y.o. with above-mentioned history of right breast cancer. Right lumpectomy on 09/28/2021 showed intermediate to high grade DCIS. She presents to the clinic today for follow-up.  She is recovering very well from the recent surgery. ? ?ALLERGIES:  is allergic to amoxicillin, meperidine and related, and wasp venom. ? ?MEDICATIONS:  ?Current Outpatient Medications  ?Medication Sig Dispense Refill  ? acetaminophen (TYLENOL) 500 MG tablet Take 500-1,000 mg by mouth every 6 (six) hours as needed for mild pain.    ? aspirin EC 81 MG tablet Take 1 tablet (81 mg total) by mouth daily. Swallow whole. 90 tablet 3  ? cetirizine (ZYRTEC) 10 MG tablet Take 10 mg by mouth daily as needed for allergies.    ? cholecalciferol (VITAMIN D) 1000 UNITS tablet Take 2,000 Units by mouth daily.     ? EPINEPHrine (EPIPEN) 0.3 mg/0.3 mL SOAJ injection Inject 0.3 mLs (0.3 mg  total) into the muscle once. (Patient taking differently: Inject 0.3 mg into the muscle as needed for anaphylaxis.) 1 Device 0  ? furosemide (LASIX) 20 MG tablet Take 20 mg by mouth daily.     ? gabapentin (NEURONTIN) 100 MG capsule Take 100 mg by mouth at bedtime.    ? hydrocortisone 2.5 % cream Apply 2.5 application topically daily as needed (hemorrhoids).    ? metoprolol succinate (TOPROL-XL) 50 MG 24 hr tablet Take 1 tablet (50 mg total) by mouth daily. Take with or immediately following a meal. 90 tablet 3  ? nitroGLYCERIN (NITROSTAT) 0.4 MG SL tablet Place 1 tablet (0.4 mg total) under the tongue every 5 (five) minutes as needed for chest pain. 25 tablet 11  ? oxyCODONE (OXY IR/ROXICODONE) 5 MG immediate release tablet Take 1 tablet (5 mg total) by mouth every 6 (six) hours as needed for severe pain. 15 tablet 0  ? pantoprazole (PROTONIX) 40 MG tablet Take 40 mg by mouth 2 (two) times daily.    ? phenylephrine-shark liver oil-mineral oil-petrolatum (PREPARATION H) 0.25-3-14-71.9 % rectal ointment Place 1 application rectally 2 (two) times daily as needed for hemorrhoids.    ? Polyethyl Glycol-Propyl Glycol (SYSTANE OP) Place 1 drop into both eyes 2 (two) times daily.    ? ?No current facility-administered medications for this visit.  ? ? ?PHYSICAL EXAMINATION: ?ECOG PERFORMANCE STATUS: 1 - Symptomatic but completely ambulatory ? ?Vitals:  ? 10/07/21 0952  ?BP: (!) 147/58  ?Pulse: 69  ?Resp: 18  ?Temp: 97.7 ?F (36.5 ?C)  ?  SpO2: 98%  ? ?Filed Weights  ? 10/07/21 0952  ?Weight: 181 lb 3.2 oz (82.2 kg)  ? ?  ? ?LABORATORY DATA:  ?I have reviewed the data as listed ?CMP Latest Ref Rng & Units 09/22/2021 11/19/2020 11/11/2020  ?Glucose 70 - 99 mg/dL 87 132(H) 90  ?BUN 8 - 23 mg/dL 20 17 23   ?Creatinine 0.44 - 1.00 mg/dL 1.03(H) 1.00 0.89  ?Sodium 135 - 145 mmol/L 139 137 139  ?Potassium 3.5 - 5.1 mmol/L 4.0 4.0 4.0  ?Chloride 98 - 111 mmol/L 105 107 106  ?CO2 22 - 32 mmol/L 28 23 25   ?Calcium 8.9 - 10.3 mg/dL 10.0 9.5  9.9  ?Total Protein 6.0 - 8.3 g/dL - - -  ?Total Bilirubin 0.3 - 1.2 mg/dL - - -  ?Alkaline Phos 39 - 117 U/L - - -  ?AST 0 - 37 U/L - - -  ?ALT 0 - 35 U/L - - -  ? ? ?Lab Results  ?Component Value Date  ? WBC 7.9 09/22/2021  ? HGB 12.8 09/22/2021  ? HCT 41.5 09/22/2021  ? MCV 84.9 09/22/2021  ? PLT 240 09/22/2021  ? NEUTROABS 9.8 (H) 11/19/2020  ? ? ?ASSESSMENT & PLAN:  ?Ductal carcinoma in situ (DCIS) of right breast ?08/20/2021:Screening mammogram: right breast calcifications. Diagnostic mammogram: Indeterminate 1.2 cm group of retroareolar right breast calcifications. Biopsy: intraductal papilloma with DCIS, ER+(100%)/PR+(20%).  ? ?09/28/21: Right Lumpectomy: IG-HG DCIS Margin Positive ? ?Pathology counseling: I discussed the final pathology report of the patient provided  a copy of this report. I discussed the margins.  We also discussed the final staging along with previously performed ER/PR testing. ? ?Plan: adjuvant radiation therapy   ?We discussed radiation and tamoxifen and determined that she would get more benefit from radiation especially given the margin positivity. ?We will discuss her case in the breast multidisciplinary tumor board regarding the margins. ? ?Return to clinic in 3 months for survivorship care plan visit virtually. ? ?No orders of the defined types were placed in this encounter. ? ?The patient has a good understanding of the overall plan. she agrees with it. she will call with any problems that may develop before the next visit here. ? ?Total time spent: 30 mins including face to face time and time spent for planning, charting and coordination of care ? ?Rulon Eisenmenger, MD, MPH ?10/07/2021 ? ?I, Thana Ates, am acting as scribe for Dr. Nicholas Lose. ? ?I have reviewed the above documentation for accuracy and completeness, and I agree with the above. ? ? ? ? ? ? ?

## 2021-10-06 NOTE — Telephone Encounter (Signed)
Called pt with surgical pathology per Dr. Brantley Stage. Discussed next step is possible xrt with Dr. Lisbeth Renshaw. Confirmed appt date and time. ?

## 2021-10-06 NOTE — Assessment & Plan Note (Signed)
08/20/2021:Screening mammogram: right breast calcifications. Diagnostic mammogram: Indeterminate 1.2 cm group of retroareolar right breast calcifications. Biopsy: intraductal papilloma with DCIS, ER+(100%)/PR+(20%).  ? ?09/28/21: Right Lumpectomy: IG-HG DCIS Margin Positive ? ?Pathology counseling: I discussed the final pathology report of the patient provided  a copy of this report. I discussed the margins.  We also discussed the final staging along with previously performed ER/PR testing. ? ?Plan: adjuvant radiation therapy or adjuvant tamoxifen ?

## 2021-10-07 ENCOUNTER — Inpatient Hospital Stay: Payer: Medicare PPO | Attending: Hematology and Oncology | Admitting: Hematology and Oncology

## 2021-10-07 ENCOUNTER — Inpatient Hospital Stay: Payer: Medicare PPO

## 2021-10-07 ENCOUNTER — Other Ambulatory Visit: Payer: Self-pay

## 2021-10-07 ENCOUNTER — Inpatient Hospital Stay: Payer: Medicare PPO | Admitting: Genetic Counselor

## 2021-10-07 DIAGNOSIS — D0511 Intraductal carcinoma in situ of right breast: Secondary | ICD-10-CM | POA: Insufficient documentation

## 2021-10-08 ENCOUNTER — Telehealth: Payer: Self-pay | Admitting: Hematology and Oncology

## 2021-10-08 NOTE — Telephone Encounter (Signed)
Scheduled appointment per 3/2 los. Patient is aware of upcoming appointments. ?

## 2021-10-14 DIAGNOSIS — H04123 Dry eye syndrome of bilateral lacrimal glands: Secondary | ICD-10-CM | POA: Diagnosis not present

## 2021-10-14 DIAGNOSIS — Z961 Presence of intraocular lens: Secondary | ICD-10-CM | POA: Diagnosis not present

## 2021-10-14 DIAGNOSIS — H524 Presbyopia: Secondary | ICD-10-CM | POA: Diagnosis not present

## 2021-10-14 DIAGNOSIS — H52203 Unspecified astigmatism, bilateral: Secondary | ICD-10-CM | POA: Diagnosis not present

## 2021-10-14 DIAGNOSIS — H5213 Myopia, bilateral: Secondary | ICD-10-CM | POA: Diagnosis not present

## 2021-10-14 NOTE — Progress Notes (Signed)
HEMATOLOGY-ONCOLOGY TELEPHONE VISIT PROGRESS NOTE ? ?I connected with '@PTNAME'$ @ on 10/18/21 at 10:00 AM EDT by telephone and verified that I am speaking with the correct person using two identifiers.  ?I discussed the limitations, risks, security and privacy concerns of performing an evaluation and management service by telephone and the availability of in person appointments.  ?I also discussed with the patient that there may be a patient responsible charge related to this service. The patient expressed understanding and ag  ?CHIEF COMPLIANT: Follow-up of right breast cancerreed to proceed.  ? ?History of Present Illness: Kimberly Gay is a 81 y.o. with above-mentioned history of right breast cancer. Right lumpectomy on 09/28/2021 showed intermediate to high grade DCIS. She presents today as a Follow up phone visit. ? ? ?Oncology History  ?Ductal carcinoma in situ (DCIS) of right breast  ?08/20/2021 Initial Diagnosis  ? Screening mammogram: right breast calcifications. Diagnostic mammogram: Indeterminate 1.2 cm group of retroareolar right breast calcifications. Biopsy: intraductal papilloma with DCIS, ER+(100%)/PR+(20%).  ?  ?09/23/2021 Cancer Staging  ? Staging form: Breast, AJCC 8th Edition ?- Clinical stage from 09/23/2021: Stage 0 (cTis (DCIS), cN0, cM0, GX, ER+, PR+) - Signed by Nicholas Lose, MD on 09/23/2021 ?Histologic grading system: 3 grade system ? ?  ? ?  ?Assessment Plan:  ?Ductal carcinoma in situ (DCIS) of right breast ?08/20/2021:Screening mammogram: right breast calcifications. Diagnostic mammogram: Indeterminate 1.2 cm group of retroareolar right breast calcifications. Biopsy: intraductal papilloma with DCIS, ER+(100%)/PR+(20%).  ?  ?09/28/21: Right Lumpectomy: IG-HG DCIS Margin Positive ?We discussed her case in the breast tumor board and decided that she does not need additional surgery. ?The plan is for her to undergo adjuvant radiation and go on surveillance. ? ?She will be seen in June for  survivorship care plan visit and she will be followed with long-term survivorship annually. ? ? ?I discussed the assessment and treatment plan with the patient. The patient was provided an opportunity to ask questions and all were answered. The patient agreed with the plan and demonstrated an understanding of the instructions. The patient was advised to call back or seek an in-person evaluation if the symptoms worsen or if the condition fails to improve as anticipated.  ? ?I provided 12 minutes of non-face-to-face time during this encounter. Harriette Ohara, MD . I, Gardiner Coins, am acting as a scribe for Dr. Lindi Adie  ?

## 2021-10-18 ENCOUNTER — Inpatient Hospital Stay (HOSPITAL_BASED_OUTPATIENT_CLINIC_OR_DEPARTMENT_OTHER): Payer: Medicare PPO | Admitting: Hematology and Oncology

## 2021-10-18 DIAGNOSIS — D0511 Intraductal carcinoma in situ of right breast: Secondary | ICD-10-CM

## 2021-10-18 NOTE — Assessment & Plan Note (Signed)
08/20/2021:Screening mammogram: right breast calcifications. Diagnostic mammogram: Indeterminate 1.2 cm group of retroareolar right breast calcifications. Biopsy: intraductal papilloma with DCIS, ER+(100%)/PR+(20%).? ?? ?09/28/21: Right Lumpectomy: IG-HG DCIS Margin Positive ?

## 2021-10-19 ENCOUNTER — Encounter: Payer: Medicare PPO | Admitting: Genetic Counselor

## 2021-10-19 ENCOUNTER — Other Ambulatory Visit: Payer: Medicare PPO

## 2021-10-21 ENCOUNTER — Ambulatory Visit: Payer: Medicare PPO | Admitting: Radiation Oncology

## 2021-10-21 ENCOUNTER — Ambulatory Visit: Payer: Medicare PPO

## 2021-10-21 ENCOUNTER — Encounter (HOSPITAL_COMMUNITY): Payer: Self-pay

## 2021-10-22 ENCOUNTER — Encounter: Payer: Self-pay | Admitting: *Deleted

## 2021-10-25 ENCOUNTER — Other Ambulatory Visit: Payer: Medicare PPO

## 2021-10-25 ENCOUNTER — Encounter: Payer: Medicare PPO | Admitting: Licensed Clinical Social Worker

## 2021-10-25 NOTE — Progress Notes (Signed)
?Radiation Oncology         [phone] ?________________________________ ? ?Name: Kimberly Gay        MRN: [id number]  ?Date of Service: 10/28/2021 DOB: 1941/03/24 ? ?[id number], Bill Salinas, MD  Nicholas Lose, MD    ? ?REFERRING PHYSICIAN: Nicholas Lose, MD ? ? ?DIAGNOSIS: The encounter diagnosis was Ductal carcinoma in situ (DCIS) of right breast. ? ? ?HISTORY OF PRESENT ILLNESS: Kimberly Gay is a 81 y.o. female seen in the multidisciplinary breast clinic for a new diagnosis of right breast cancer. The patient was noted to have a screening detected mass in the right breast in the retroareolar position. By diagnostic mammogram a 1.2 cm area of calcifications in linear orientation were seen in the retroareolar breast.  A biopsy was performed on 08/20/21 that showed an ER/PR positive intraductal pailloma with DCIS. No grade was given. 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 Dwaine Gale and recent MRI abdomen on 09/07/21 showed no recurrent disease at the site of prior ablation and she will meet with Dr. Dwaine Gale in October 2023.  ? ?Otherwise since her last visit, she also underwent her right lumpectomy on 09/28/2021. This showed  intermediate to high-grade DCIS was noted with involvement in the lateral resection margin and an additional focal change in the superior margin, the additional lateral inferior margin showed fibrocystic change in the medial posterior margin also showed intermediate grade DCIS involving the inked margin.  She met with Dr. Brantley Stage on 10/25/2021 and he does not recommend any further surgery. She's seen to discuss adjuvant radiotherapy. ? ?PREVIOUS RADIATION THERAPY: No ? ? ?PAST MEDICAL HISTORY:  ?Past Medical History:  ?Diagnosis Date  ? Ascending aorta enlargement (HCC)   ? ectactic 3.5 cm 05/03/19 CTA chest  ? Brain aneurysm 04/03/2019  ? Breast cancer ([place]) 08/20/2021  ? Cancer [facility]) 1994  ? colon-resection  ? Cerebral aneurysm 04/03/2019  ? Cerebral aneurysm,  nonruptured 2010  ? not treated due to location-monitor yearly  ? Dyspnea on exertion 04/03/2019  ? Dysrhythmia   ? afib   ? GERD (gastroesophageal reflux disease)   ? infrequent  ? Guaiac + stool 02/26/2020  ? History of hiatal hernia   ? Hypertension   ? well controlled  ? Incontinence of urine   ? Joint pain, knee   ? Palpitations 04/03/2019  ? Paroxysmal atrial fibrillation (HCC) CHA2DS2-VASc equals 4, not anticoagulated so far because of history of intracranial bleed and aneurysm 10/28/2019  ? s/p Watchman Device 03/25/20  ? Precordial chest pain 04/03/2019  ? Renal mass   ? 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 Stroke [facility])   ? due to aneurysm rupture in 2010   ? Urgency of urination   ?   ? ? ?PAST SURGICAL HISTORY: ?Past Surgical History:  ?Procedure Laterality Date  ? ABDOMINAL HYSTERECTOMY    ? ANEURYSM COILING  2010  ? cerebral coiling-no deficits  ? BACK SURGERY    ? BREAST EXCISIONAL BIOPSY Left 04/05/1994  ? BREAST LUMPECTOMY WITH RADIOACTIVE SEED LOCALIZATION Right 09/28/2021  ? Procedure: RIGHT BREAST LUMPECTOMY WITH RADIOACTIVE SEED LOCALIZATION;  Surgeon: Erroll Luna, MD;  Location: [place];  Service: General;  Laterality: Right;  ? CHOLECYSTECTOMY    ? CYSTOSCOPY  09/03/2012  ? Procedure: CYSTOSCOPY;  Surgeon: Ailene Rud, MD;  Location: [facility];  Service: Urology;  Laterality:  N/A;  ? FLEXIBLE SIGMOIDOSCOPY N/A 11/24/2014  ? Procedure: FLEXIBLE SIGMOIDOSCOPY;  Surgeon: Garlan Fair, MD;  Location: [facility];  Service: Endoscopy;  Laterality: N/A;  ? IR EMBO ART  VEN HEMORR LYMPH EXTRAV  INC GUIDE ROADMAPPING  11/18/2020  ? IR RADIOLOGIST EVAL & MGMT  09/23/2020  ? IR RADIOLOGIST EVAL & MGMT  12/15/2020  ? IR RADIOLOGIST EVAL & MGMT  02/23/2021  ? IR RENAL SUPRASEL UNI S&I MOD SED  11/18/2020  ? IR US GUIDE VASC ACCESS RIGHT  11/18/2020  ? LAMINECTOMY    ? LEFT ATRIAL APPENDAGE  OCCLUSION    ? device implanted [facility]/25/20 [facility]  ? LYSIS OF ADHESION  2000's  ? with cholecystectomy procedure  ? PILONIDAL CYST EXCISION    ? PUBOVAGINAL SLING  09/03/2012  ? Procedure: Gaynelle Arabian;  Surgeon: Ailene Rud, MD;  Location[facility];  Service: Urology;  Laterality: N/A;  LYNX SLING   ? RADIOLOGY WITH ANESTHESIA Right 11/18/2020  ? Procedure: IR WITH ANESTHESIA RENAL CRYOABLATION;  Surgeon: Arne Cleveland, MD;  Location: [facility];  Service: Radiology;  Laterality: Right;  ? ? ? ?FAMILY HISTORY:  ?Family History  ?Problem Relation Age of Onset  ? Seizures Mother   ? Heart attack Father   ? Breast cancer Cousin   ? Ovarian cancer Cousin   ? Diabetes Other   ? ? ? ?SOCIAL HISTORY:  reports that she has never smoked. She has never been exposed to tobacco smoke. She has never used smokeless tobacco. She reports that she does not drink alcohol and does not use drugs. The patient is widowed and lives in [place].  ? ?ALLERGIES: Amoxicillin, Meperidine and related, and Wasp venom ? ? ?MEDICATIONS:  ?Current Outpatient Medications  ?Medication Sig Dispense Refill  ? acetaminophen (TYLENOL) 500 MG tablet Take 500-1,000 mg by mouth every 6 (six) hours as needed for mild pain.    ? aspirin EC 81 MG tablet Take 1 tablet (81 mg total) by mouth daily. Swallow whole. 90 tablet 3  ? cetirizine (ZYRTEC) 10 MG tablet Take 10 mg by mouth daily as needed for allergies.    ? cholecalciferol (VITAMIN D) 1000 UNITS tablet Take 2,000 Units by mouth daily.     ? EPINEPHrine (EPIPEN) 0.3 mg/0.3 mL SOAJ injection Inject 0.3 mLs (0.3 mg total) into the muscle once. (Patient taking differently: Inject 0.3 mg into the muscle as needed for anaphylaxis.) 1 Device 0  ? furosemide (LASIX) 20 MG tablet Take 20 mg by mouth daily.     ? gabapentin (NEURONTIN) 100 MG capsule Take 100 mg by mouth at bedtime.    ? hydrocortisone 2.5 % cream Apply 2.5 application topically daily as needed (hemorrhoids).     ? metoprolol succinate (TOPROL-XL) 50 MG 24 hr tablet Take 1 tablet (50 mg total) by mouth daily. Take with or immediately following a meal. 90 tablet 3  ? nitroGLYCERIN (NITROSTAT) 0.4 MG SL tablet Place 1 tablet (0.4 mg total) under the tongue every 5 (five) minutes as needed for chest pain. 25 tablet 11  ? oxyCODONE (OXY IR/ROXICODONE) 5 MG immediate release tablet Take 1 tablet (5 mg total) by mouth every 6 (six) hours as needed for severe pain. 15 tablet 0  ? pantoprazole (PROTONIX) 40 MG tablet Take 40 mg by mouth 2 (two) times daily.    ? phenylephrine-shark liver oil-mineral oil-petrolatum (PREPARATION H) 0.25-3-14-71.9 % rectal ointment Place 1 application rectally 2 (two) times daily as  needed for hemorrhoids.    ? Polyethyl Glycol-Propyl Glycol (SYSTANE OP) Place 1 drop into both eyes 2 (two) times daily.    ? ?No current facility-administered medications for this visit.  ? ? ? ?REVIEW OF SYSTEMS: On review of systems, the patient reports that she is doing well overall. She denies any concerns with her healing. Her prior complaints of pain in the posterior trunk have resolved. No other complaints are verbalized.  ? ?  ? ?PHYSICAL EXAM:  ?Wt Readings from Last 3 Encounters:  ?10/07/21 181 lb 3.2 oz (82.2 kg)  ?09/28/21 178 lb 3.2 oz (80.8 kg)  ?09/23/21 178 lb 3.2 oz (80.8 kg)  ? ?Temp Readings from Last 3 Encounters:  ?10/07/21 97.7 ?F (36.5 ?C) (Temporal)  ?09/28/21 97.6 ?F (36.4 ?C)  ?09/23/21 97.7 ?F (36.5 ?C) (Temporal)  ? ?BP Readings from Last 3 Encounters:  ?10/07/21 (!) 147/58  ?09/28/21 138/65  ?09/23/21 128/68  ? ?Pulse Readings from Last 3 Encounters:  ?10/07/21 69  ?09/28/21 67  ?09/23/21 65  ? ? ?In general this is a well appearing caucasian female in no acute distress. She's alert and oriented x4 and appropriate throughout the examination. Cardiopulmonary assessment is negative for acute distress and she exhibits normal effort. The right breast incision is well healed without erythema,  separation, or drainage. ? ? ?ECOG = 0 ? ?0 - Asymptomatic (Fully active, able to carry on all predisease activities without restriction) ? ?1 - Symptomatic but completely ambulatory (Restricted in physically st

## 2021-10-27 ENCOUNTER — Telehealth: Payer: Self-pay

## 2021-10-27 NOTE — Telephone Encounter (Signed)
Verified patient identity and reminded patient of their 7:30am-10/28/21 in-person appointment w/ Shona Simpson PA-C. I left my extension [phone] in case patient needs anything. Patient verbalized understanding of information. ?

## 2021-10-28 ENCOUNTER — Ambulatory Visit
Admission: RE | Admit: 2021-10-28 | Discharge: 2021-10-28 | Disposition: A | Discharge status: Home / Self Care | Payer: Medicare PPO | Source: Ambulatory Visit | Attending: Radiation Oncology | Admitting: Radiation Oncology

## 2021-10-28 ENCOUNTER — Other Ambulatory Visit: Payer: Self-pay

## 2021-10-28 ENCOUNTER — Encounter: Payer: Self-pay | Admitting: Radiation Oncology

## 2021-10-28 VITALS — BP 144/72 | HR 71 | Temp 97.3°F | Resp 18 | Ht 65.0 in | Wt 179.2 lb

## 2021-10-28 DIAGNOSIS — Z7982 Long term (current) use of aspirin: Secondary | ICD-10-CM | POA: Insufficient documentation

## 2021-10-28 DIAGNOSIS — Z8041 Family history of malignant neoplasm of ovary: Secondary | ICD-10-CM | POA: Diagnosis not present

## 2021-10-28 DIAGNOSIS — Z170 Estrogen receptor positive status [ER+]: Secondary | ICD-10-CM | POA: Insufficient documentation

## 2021-10-28 DIAGNOSIS — D0511 Intraductal carcinoma in situ of right breast: Secondary | ICD-10-CM | POA: Insufficient documentation

## 2021-10-28 DIAGNOSIS — K219 Gastro-esophageal reflux disease without esophagitis: Secondary | ICD-10-CM | POA: Diagnosis not present

## 2021-10-28 DIAGNOSIS — Z85528 Personal history of other malignant neoplasm of kidney: Secondary | ICD-10-CM | POA: Diagnosis not present

## 2021-10-28 DIAGNOSIS — Z8673 Personal history of transient ischemic attack (TIA), and cerebral infarction without residual deficits: Secondary | ICD-10-CM | POA: Diagnosis not present

## 2021-10-28 DIAGNOSIS — Z803 Family history of malignant neoplasm of breast: Secondary | ICD-10-CM | POA: Insufficient documentation

## 2021-10-28 DIAGNOSIS — I10 Essential (primary) hypertension: Secondary | ICD-10-CM | POA: Diagnosis not present

## 2021-10-28 DIAGNOSIS — Z87891 Personal history of nicotine dependence: Secondary | ICD-10-CM | POA: Insufficient documentation

## 2021-10-28 NOTE — Progress Notes (Signed)
New Breast Cancer Diagnosis: Right Breast ? ?She was seen by Dr. Brantley Stage on 10/25/2021. ? ?Surgical Pathology Report: Right Breast 09/28/2021 ? ? ?Lymphedema issues, if any: No    ? ?Pain issues, if any:  No  ? ?SAFETY ISSUES: ?Prior radiation? No ?Pacemaker/ICD? No Pacer.  She has a Product/process development scientist. ?Possible current pregnancy? Hysterectomy ?Is the patient on methotrexate? No ? ?Current Complaints / other details:   ? ?

## 2021-11-04 ENCOUNTER — Encounter: Payer: Self-pay | Admitting: *Deleted

## 2021-11-07 DIAGNOSIS — D0511 Intraductal carcinoma in situ of right breast: Secondary | ICD-10-CM | POA: Insufficient documentation

## 2021-11-07 DIAGNOSIS — Z170 Estrogen receptor positive status [ER+]: Secondary | ICD-10-CM | POA: Diagnosis not present

## 2021-11-08 ENCOUNTER — Other Ambulatory Visit: Payer: Self-pay

## 2021-11-08 ENCOUNTER — Ambulatory Visit
Admission: RE | Admit: 2021-11-08 | Discharge: 2021-11-08 | Disposition: A | Discharge status: Home / Self Care | Payer: Medicare PPO | Source: Ambulatory Visit | Attending: Radiation Oncology | Admitting: Radiation Oncology

## 2021-11-08 DIAGNOSIS — D0511 Intraductal carcinoma in situ of right breast: Secondary | ICD-10-CM | POA: Diagnosis not present

## 2021-11-08 DIAGNOSIS — Z510 Encounter for antineoplastic radiation therapy: Secondary | ICD-10-CM | POA: Diagnosis not present

## 2021-11-08 DIAGNOSIS — Z170 Estrogen receptor positive status [ER+]: Secondary | ICD-10-CM | POA: Diagnosis not present

## 2021-11-09 ENCOUNTER — Ambulatory Visit
Admission: RE | Admit: 2021-11-09 | Discharge: 2021-11-09 | Disposition: A | Discharge status: Home / Self Care | Payer: Medicare PPO | Source: Ambulatory Visit | Attending: Radiation Oncology | Admitting: Radiation Oncology

## 2021-11-09 DIAGNOSIS — D0511 Intraductal carcinoma in situ of right breast: Secondary | ICD-10-CM | POA: Diagnosis not present

## 2021-11-09 DIAGNOSIS — Z510 Encounter for antineoplastic radiation therapy: Secondary | ICD-10-CM | POA: Diagnosis not present

## 2021-11-09 DIAGNOSIS — Z170 Estrogen receptor positive status [ER+]: Secondary | ICD-10-CM | POA: Diagnosis not present

## 2021-11-10 ENCOUNTER — Ambulatory Visit
Admission: RE | Admit: 2021-11-10 | Discharge: 2021-11-10 | Disposition: A | Discharge status: Home / Self Care | Payer: Medicare PPO | Source: Ambulatory Visit | Attending: Radiation Oncology | Admitting: Radiation Oncology

## 2021-11-10 ENCOUNTER — Other Ambulatory Visit: Payer: Self-pay

## 2021-11-10 DIAGNOSIS — D0511 Intraductal carcinoma in situ of right breast: Secondary | ICD-10-CM | POA: Diagnosis not present

## 2021-11-10 DIAGNOSIS — Z510 Encounter for antineoplastic radiation therapy: Secondary | ICD-10-CM | POA: Diagnosis not present

## 2021-11-10 DIAGNOSIS — Z170 Estrogen receptor positive status [ER+]: Secondary | ICD-10-CM | POA: Diagnosis not present

## 2021-11-11 ENCOUNTER — Ambulatory Visit
Admission: RE | Admit: 2021-11-11 | Discharge: 2021-11-11 | Disposition: A | Discharge status: Home / Self Care | Payer: Medicare PPO | Source: Ambulatory Visit | Attending: Radiation Oncology | Admitting: Radiation Oncology

## 2021-11-11 DIAGNOSIS — Z510 Encounter for antineoplastic radiation therapy: Secondary | ICD-10-CM | POA: Diagnosis not present

## 2021-11-11 DIAGNOSIS — Z170 Estrogen receptor positive status [ER+]: Secondary | ICD-10-CM | POA: Diagnosis not present

## 2021-11-11 DIAGNOSIS — D0511 Intraductal carcinoma in situ of right breast: Secondary | ICD-10-CM | POA: Diagnosis not present

## 2021-11-12 ENCOUNTER — Other Ambulatory Visit: Payer: Self-pay

## 2021-11-12 ENCOUNTER — Ambulatory Visit
Admission: RE | Admit: 2021-11-12 | Discharge: 2021-11-12 | Disposition: A | Discharge status: Home / Self Care | Payer: Medicare PPO | Source: Ambulatory Visit | Attending: Radiation Oncology | Admitting: Radiation Oncology

## 2021-11-12 DIAGNOSIS — D0511 Intraductal carcinoma in situ of right breast: Secondary | ICD-10-CM | POA: Diagnosis not present

## 2021-11-12 DIAGNOSIS — Z170 Estrogen receptor positive status [ER+]: Secondary | ICD-10-CM | POA: Diagnosis not present

## 2021-11-12 DIAGNOSIS — Z510 Encounter for antineoplastic radiation therapy: Secondary | ICD-10-CM | POA: Diagnosis not present

## 2021-11-12 MED ORDER — RADIAPLEXRX EX GEL: Freq: Once | CUTANEOUS | Status: AC

## 2021-11-12 NOTE — Progress Notes (Signed)
Pt here for patient teaching.  Pt given Radiation and You booklet, skin care instructions, and Radiaplex.  Patient was advised to obtain over the counter aluminum free deodorant.  Reviewed areas of pertinence such as fatigue, hair loss, skin changes, breast tenderness, and breast swelling. Pt able to give teach back of to pat skin and use unscented/gentle soap,apply Radiaplex bid, avoid applying anything to skin within 4 hours of treatment, avoid wearing an under wire bra, and to use an electric razor if they must shave. Pt verbalizes understanding of information given and will contact nursing with any questions or concerns.   ? ?Gloriajean Dell. Leonie Green, BSN  ?

## 2021-11-15 ENCOUNTER — Other Ambulatory Visit: Payer: Self-pay

## 2021-11-15 ENCOUNTER — Ambulatory Visit
Admission: RE | Admit: 2021-11-15 | Discharge: 2021-11-15 | Disposition: A | Discharge status: Home / Self Care | Payer: Medicare PPO | Source: Ambulatory Visit | Attending: Radiation Oncology | Admitting: Radiation Oncology

## 2021-11-15 DIAGNOSIS — D0511 Intraductal carcinoma in situ of right breast: Secondary | ICD-10-CM | POA: Diagnosis not present

## 2021-11-15 DIAGNOSIS — Z510 Encounter for antineoplastic radiation therapy: Secondary | ICD-10-CM | POA: Diagnosis not present

## 2021-11-15 DIAGNOSIS — Z170 Estrogen receptor positive status [ER+]: Secondary | ICD-10-CM | POA: Diagnosis not present

## 2021-11-16 ENCOUNTER — Ambulatory Visit
Admission: RE | Admit: 2021-11-16 | Discharge: 2021-11-16 | Disposition: A | Discharge status: Home / Self Care | Payer: Medicare PPO | Source: Ambulatory Visit | Attending: Radiation Oncology | Admitting: Radiation Oncology

## 2021-11-16 DIAGNOSIS — Z510 Encounter for antineoplastic radiation therapy: Secondary | ICD-10-CM | POA: Diagnosis not present

## 2021-11-16 DIAGNOSIS — D0511 Intraductal carcinoma in situ of right breast: Secondary | ICD-10-CM | POA: Diagnosis not present

## 2021-11-16 DIAGNOSIS — Z170 Estrogen receptor positive status [ER+]: Secondary | ICD-10-CM | POA: Diagnosis not present

## 2021-11-17 ENCOUNTER — Other Ambulatory Visit: Payer: Self-pay

## 2021-11-17 ENCOUNTER — Ambulatory Visit
Admission: RE | Admit: 2021-11-17 | Discharge: 2021-11-17 | Disposition: A | Discharge status: Home / Self Care | Payer: Medicare PPO | Source: Ambulatory Visit | Attending: Radiation Oncology | Admitting: Radiation Oncology

## 2021-11-17 DIAGNOSIS — Z170 Estrogen receptor positive status [ER+]: Secondary | ICD-10-CM | POA: Diagnosis not present

## 2021-11-17 DIAGNOSIS — Z510 Encounter for antineoplastic radiation therapy: Secondary | ICD-10-CM | POA: Diagnosis not present

## 2021-11-17 DIAGNOSIS — D0511 Intraductal carcinoma in situ of right breast: Secondary | ICD-10-CM | POA: Diagnosis not present

## 2021-11-18 ENCOUNTER — Ambulatory Visit
Admission: RE | Admit: 2021-11-18 | Discharge: 2021-11-18 | Disposition: A | Discharge status: Home / Self Care | Payer: Medicare PPO | Source: Ambulatory Visit | Attending: Radiation Oncology | Admitting: Radiation Oncology

## 2021-11-18 DIAGNOSIS — Z510 Encounter for antineoplastic radiation therapy: Secondary | ICD-10-CM | POA: Diagnosis not present

## 2021-11-18 DIAGNOSIS — Z170 Estrogen receptor positive status [ER+]: Secondary | ICD-10-CM | POA: Diagnosis not present

## 2021-11-18 DIAGNOSIS — D0511 Intraductal carcinoma in situ of right breast: Secondary | ICD-10-CM | POA: Diagnosis not present

## 2021-11-19 ENCOUNTER — Other Ambulatory Visit: Payer: Self-pay

## 2021-11-19 ENCOUNTER — Ambulatory Visit
Admission: RE | Admit: 2021-11-19 | Discharge: 2021-11-19 | Disposition: A | Discharge status: Home / Self Care | Payer: Medicare PPO | Source: Ambulatory Visit | Attending: Radiation Oncology | Admitting: Radiation Oncology

## 2021-11-19 DIAGNOSIS — Z510 Encounter for antineoplastic radiation therapy: Secondary | ICD-10-CM | POA: Diagnosis not present

## 2021-11-19 DIAGNOSIS — Z170 Estrogen receptor positive status [ER+]: Secondary | ICD-10-CM | POA: Diagnosis not present

## 2021-11-19 DIAGNOSIS — D0511 Intraductal carcinoma in situ of right breast: Secondary | ICD-10-CM | POA: Diagnosis not present

## 2021-11-22 ENCOUNTER — Other Ambulatory Visit: Payer: Self-pay

## 2021-11-22 ENCOUNTER — Ambulatory Visit
Admission: RE | Admit: 2021-11-22 | Discharge: 2021-11-22 | Disposition: A | Discharge status: Home / Self Care | Payer: Medicare PPO | Source: Ambulatory Visit | Attending: Radiation Oncology | Admitting: Radiation Oncology

## 2021-11-22 DIAGNOSIS — Z170 Estrogen receptor positive status [ER+]: Secondary | ICD-10-CM | POA: Diagnosis not present

## 2021-11-22 DIAGNOSIS — Z510 Encounter for antineoplastic radiation therapy: Secondary | ICD-10-CM | POA: Diagnosis not present

## 2021-11-22 DIAGNOSIS — D0511 Intraductal carcinoma in situ of right breast: Secondary | ICD-10-CM | POA: Diagnosis not present

## 2021-11-23 ENCOUNTER — Other Ambulatory Visit: Payer: Self-pay

## 2021-11-23 ENCOUNTER — Ambulatory Visit
Admission: RE | Admit: 2021-11-23 | Discharge: 2021-11-23 | Disposition: A | Discharge status: Home / Self Care | Payer: Medicare PPO | Source: Ambulatory Visit | Attending: Radiation Oncology | Admitting: Radiation Oncology

## 2021-11-23 DIAGNOSIS — Z510 Encounter for antineoplastic radiation therapy: Secondary | ICD-10-CM | POA: Diagnosis not present

## 2021-11-23 DIAGNOSIS — D0511 Intraductal carcinoma in situ of right breast: Secondary | ICD-10-CM | POA: Diagnosis not present

## 2021-11-23 DIAGNOSIS — Z170 Estrogen receptor positive status [ER+]: Secondary | ICD-10-CM | POA: Diagnosis not present

## 2021-11-23 LAB — RAD ONC ARIA SESSION SUMMARY
Course Elapsed Days: 15
Plan Fractions Treated to Date: 12
Plan Prescribed Dose Per Fraction: 2.66 Gy
Plan Total Fractions Prescribed: 16
Plan Total Prescribed Dose: 42.56 Gy
Reference Point Dosage Given to Date: 31.92 Gy
Reference Point Session Dosage Given: 2.66 Gy
Session Number: 12

## 2021-11-24 ENCOUNTER — Ambulatory Visit
Admission: RE | Admit: 2021-11-24 | Discharge: 2021-11-24 | Disposition: A | Discharge status: Home / Self Care | Payer: Medicare PPO | Source: Ambulatory Visit | Attending: Radiation Oncology | Admitting: Radiation Oncology

## 2021-11-24 ENCOUNTER — Other Ambulatory Visit: Payer: Self-pay

## 2021-11-24 DIAGNOSIS — Z510 Encounter for antineoplastic radiation therapy: Secondary | ICD-10-CM | POA: Diagnosis not present

## 2021-11-24 DIAGNOSIS — Z170 Estrogen receptor positive status [ER+]: Secondary | ICD-10-CM | POA: Diagnosis not present

## 2021-11-24 DIAGNOSIS — D0511 Intraductal carcinoma in situ of right breast: Secondary | ICD-10-CM | POA: Diagnosis not present

## 2021-11-24 LAB — RAD ONC ARIA SESSION SUMMARY
Course Elapsed Days: 16
Plan Fractions Treated to Date: 13
Plan Prescribed Dose Per Fraction: 2.66 Gy
Plan Total Fractions Prescribed: 16
Plan Total Prescribed Dose: 42.56 Gy
Reference Point Dosage Given to Date: 34.58 Gy
Reference Point Session Dosage Given: 2.66 Gy
Session Number: 13

## 2021-11-25 ENCOUNTER — Ambulatory Visit
Admission: RE | Admit: 2021-11-25 | Discharge: 2021-11-25 | Disposition: A | Discharge status: Home / Self Care | Payer: Medicare PPO | Source: Ambulatory Visit | Attending: Radiation Oncology | Admitting: Radiation Oncology

## 2021-11-25 ENCOUNTER — Other Ambulatory Visit: Payer: Self-pay

## 2021-11-25 DIAGNOSIS — Z510 Encounter for antineoplastic radiation therapy: Secondary | ICD-10-CM | POA: Diagnosis not present

## 2021-11-25 DIAGNOSIS — D0511 Intraductal carcinoma in situ of right breast: Secondary | ICD-10-CM | POA: Diagnosis not present

## 2021-11-25 DIAGNOSIS — Z170 Estrogen receptor positive status [ER+]: Secondary | ICD-10-CM | POA: Diagnosis not present

## 2021-11-25 LAB — RAD ONC ARIA SESSION SUMMARY
Course Elapsed Days: 17
Plan Fractions Treated to Date: 14
Plan Prescribed Dose Per Fraction: 2.66 Gy
Plan Total Fractions Prescribed: 16
Plan Total Prescribed Dose: 42.56 Gy
Reference Point Dosage Given to Date: 37.24 Gy
Reference Point Session Dosage Given: 2.66 Gy
Session Number: 14

## 2021-11-26 ENCOUNTER — Ambulatory Visit
Admission: RE | Admit: 2021-11-26 | Discharge: 2021-11-26 | Disposition: A | Discharge status: Home / Self Care | Payer: Medicare PPO | Source: Ambulatory Visit | Attending: Radiation Oncology | Admitting: Radiation Oncology

## 2021-11-26 ENCOUNTER — Other Ambulatory Visit: Payer: Self-pay

## 2021-11-26 DIAGNOSIS — Z510 Encounter for antineoplastic radiation therapy: Secondary | ICD-10-CM | POA: Diagnosis not present

## 2021-11-26 DIAGNOSIS — D0511 Intraductal carcinoma in situ of right breast: Secondary | ICD-10-CM | POA: Diagnosis not present

## 2021-11-26 DIAGNOSIS — Z170 Estrogen receptor positive status [ER+]: Secondary | ICD-10-CM | POA: Diagnosis not present

## 2021-11-26 LAB — RAD ONC ARIA SESSION SUMMARY
Course Elapsed Days: 18
Plan Fractions Treated to Date: 15
Plan Prescribed Dose Per Fraction: 2.66 Gy
Plan Total Fractions Prescribed: 16
Plan Total Prescribed Dose: 42.56 Gy
Reference Point Dosage Given to Date: 39.9 Gy
Reference Point Session Dosage Given: 2.66 Gy
Session Number: 15

## 2021-11-29 ENCOUNTER — Ambulatory Visit: Payer: Medicare PPO

## 2021-11-30 ENCOUNTER — Ambulatory Visit: Payer: Medicare PPO

## 2021-11-30 ENCOUNTER — Other Ambulatory Visit: Payer: Self-pay

## 2021-11-30 ENCOUNTER — Ambulatory Visit
Admission: RE | Admit: 2021-11-30 | Discharge: 2021-11-30 | Disposition: A | Discharge status: Home / Self Care | Payer: Medicare PPO | Source: Ambulatory Visit | Attending: Radiation Oncology | Admitting: Radiation Oncology

## 2021-11-30 DIAGNOSIS — Z170 Estrogen receptor positive status [ER+]: Secondary | ICD-10-CM | POA: Diagnosis not present

## 2021-11-30 DIAGNOSIS — Z510 Encounter for antineoplastic radiation therapy: Secondary | ICD-10-CM | POA: Diagnosis not present

## 2021-11-30 DIAGNOSIS — D0511 Intraductal carcinoma in situ of right breast: Secondary | ICD-10-CM | POA: Diagnosis not present

## 2021-11-30 LAB — RAD ONC ARIA SESSION SUMMARY
Course Elapsed Days: 22
Plan Fractions Treated to Date: 16
Plan Prescribed Dose Per Fraction: 2.66 Gy
Plan Total Fractions Prescribed: 16
Plan Total Prescribed Dose: 42.56 Gy
Reference Point Dosage Given to Date: 42.56 Gy
Reference Point Session Dosage Given: 2.66 Gy
Session Number: 16

## 2021-12-01 ENCOUNTER — Other Ambulatory Visit: Payer: Self-pay

## 2021-12-01 ENCOUNTER — Ambulatory Visit: Payer: Medicare PPO

## 2021-12-01 ENCOUNTER — Ambulatory Visit
Admission: RE | Admit: 2021-12-01 | Discharge: 2021-12-01 | Disposition: A | Discharge status: Home / Self Care | Payer: Medicare PPO | Source: Ambulatory Visit | Attending: Radiation Oncology | Admitting: Radiation Oncology

## 2021-12-01 DIAGNOSIS — D0511 Intraductal carcinoma in situ of right breast: Secondary | ICD-10-CM | POA: Diagnosis not present

## 2021-12-01 LAB — RAD ONC ARIA SESSION SUMMARY
Course Elapsed Days: 23
Plan Fractions Treated to Date: 1
Plan Prescribed Dose Per Fraction: 2.5 Gy
Plan Total Fractions Prescribed: 4
Plan Total Prescribed Dose: 10 Gy
Reference Point Dosage Given to Date: 45.06 Gy
Reference Point Session Dosage Given: 2.5 Gy
Session Number: 17

## 2021-12-02 ENCOUNTER — Other Ambulatory Visit: Payer: Self-pay

## 2021-12-02 ENCOUNTER — Ambulatory Visit
Admission: RE | Admit: 2021-12-02 | Discharge: 2021-12-02 | Disposition: A | Discharge status: Home / Self Care | Payer: Medicare PPO | Source: Ambulatory Visit | Attending: Radiation Oncology | Admitting: Radiation Oncology

## 2021-12-02 ENCOUNTER — Ambulatory Visit: Payer: Medicare PPO

## 2021-12-02 DIAGNOSIS — D0511 Intraductal carcinoma in situ of right breast: Secondary | ICD-10-CM | POA: Diagnosis not present

## 2021-12-02 LAB — RAD ONC ARIA SESSION SUMMARY
Course Elapsed Days: 24
Plan Fractions Treated to Date: 2
Plan Prescribed Dose Per Fraction: 2.5 Gy
Plan Total Fractions Prescribed: 4
Plan Total Prescribed Dose: 10 Gy
Reference Point Dosage Given to Date: 47.56 Gy
Reference Point Session Dosage Given: 2.5 Gy
Session Number: 18

## 2021-12-03 ENCOUNTER — Other Ambulatory Visit: Payer: Self-pay

## 2021-12-03 ENCOUNTER — Ambulatory Visit
Admission: RE | Admit: 2021-12-03 | Discharge: 2021-12-03 | Disposition: A | Discharge status: Home / Self Care | Payer: Medicare PPO | Source: Ambulatory Visit | Attending: Radiation Oncology | Admitting: Radiation Oncology

## 2021-12-03 ENCOUNTER — Ambulatory Visit: Payer: Medicare PPO

## 2021-12-03 DIAGNOSIS — D0511 Intraductal carcinoma in situ of right breast: Secondary | ICD-10-CM | POA: Diagnosis not present

## 2021-12-03 LAB — RAD ONC ARIA SESSION SUMMARY
Course Elapsed Days: 25
Plan Fractions Treated to Date: 3
Plan Prescribed Dose Per Fraction: 2.5 Gy
Plan Total Fractions Prescribed: 4
Plan Total Prescribed Dose: 10 Gy
Reference Point Dosage Given to Date: 50.06 Gy
Reference Point Session Dosage Given: 2.5 Gy
Session Number: 19

## 2021-12-06 ENCOUNTER — Other Ambulatory Visit: Payer: Self-pay

## 2021-12-06 ENCOUNTER — Ambulatory Visit
Admission: RE | Admit: 2021-12-06 | Discharge: 2021-12-06 | Disposition: A | Discharge status: Home / Self Care | Payer: Medicare PPO | Source: Ambulatory Visit | Attending: Radiation Oncology | Admitting: Radiation Oncology

## 2021-12-06 ENCOUNTER — Encounter: Payer: Self-pay | Admitting: Radiation Oncology

## 2021-12-06 DIAGNOSIS — D0511 Intraductal carcinoma in situ of right breast: Secondary | ICD-10-CM | POA: Diagnosis not present

## 2021-12-06 LAB — RAD ONC ARIA SESSION SUMMARY
Course Elapsed Days: 28
Plan Fractions Treated to Date: 4
Plan Prescribed Dose Per Fraction: 2.5 Gy
Plan Total Fractions Prescribed: 4
Plan Total Prescribed Dose: 10 Gy
Reference Point Dosage Given to Date: 52.56 Gy
Reference Point Session Dosage Given: 2.5 Gy
Session Number: 20

## 2021-12-08 ENCOUNTER — Encounter: Payer: Self-pay | Admitting: *Deleted

## 2021-12-28 NOTE — Progress Notes (Signed)
                                                                                                                                                             Patient Name: Kimberly Gay MRN: [id number] DOB: 05-05-1941 Referring Physician: Milagros Evener (Profile Not Attached) Date of Service: 12/06/2021 [facility], [place]                                                        End Of Treatment Note  Diagnoses: D05.11-Intraductal carcinoma in situ of right breast  Cancer Staging:  ER/PR positive Intermediate to high grade DCIS of the right breast  Intent: Curative  Radiation Treatment Dates: 11/08/2021 through 12/06/2021 Site Technique Total Dose (Gy) Dose per Fx (Gy) Completed Fx Beam Energies  Breast, Right: Breast_R 3D 42.56/42.56 2.66 16/16 6XFFF  Breast, Right: Breast_R_Bst specialPort 10/10 2.5 4/4 12E, 15E   Narrative: The patient tolerated radiation therapy relatively well. She developed fatigue and anticipated skin changes in the treatment field.     Plan: The patient will receive a call in about one month from the radiation oncology department. She will continue follow up with Dr. Lindi Adie as well.   ________________________________________________    Carola Rhine, [facility]

## 2022-01-07 ENCOUNTER — Encounter: Payer: Self-pay | Admitting: Adult Health

## 2022-01-07 ENCOUNTER — Inpatient Hospital Stay: Payer: Medicare PPO | Attending: Hematology and Oncology | Admitting: Adult Health

## 2022-01-07 ENCOUNTER — Other Ambulatory Visit: Payer: Self-pay

## 2022-01-07 VITALS — BP 158/80 | HR 65 | Temp 98.6°F | Resp 18 | Ht 65.0 in | Wt 171.6 lb

## 2022-01-07 DIAGNOSIS — I10 Essential (primary) hypertension: Secondary | ICD-10-CM | POA: Diagnosis not present

## 2022-01-07 DIAGNOSIS — Z90710 Acquired absence of both cervix and uterus: Secondary | ICD-10-CM | POA: Diagnosis not present

## 2022-01-07 DIAGNOSIS — Z803 Family history of malignant neoplasm of breast: Secondary | ICD-10-CM | POA: Diagnosis not present

## 2022-01-07 DIAGNOSIS — Z8041 Family history of malignant neoplasm of ovary: Secondary | ICD-10-CM | POA: Diagnosis not present

## 2022-01-07 DIAGNOSIS — D0511 Intraductal carcinoma in situ of right breast: Secondary | ICD-10-CM | POA: Insufficient documentation

## 2022-01-07 NOTE — Progress Notes (Signed)
SURVIVORSHIP VISIT:  BRIEF ONCOLOGIC HISTORY:  Oncology History  Ductal carcinoma in situ (DCIS) of right breast  08/20/2021 Initial Diagnosis   Screening mammogram: right breast calcifications. Diagnostic mammogram: Indeterminate 1.2 cm group of retroareolar right breast calcifications. Biopsy: intraductal papilloma with DCIS, ER+(100%)/PR+(20%).    09/23/2021 Cancer Staging   Staging form: Breast, AJCC 8th Edition - Clinical stage from 09/23/2021: Stage 0 (cTis (DCIS), cN0, cM0, GX, ER+, PR+) - Signed by Nicholas Lose, MD on 09/23/2021 Histologic grading system: 3 grade system    11/08/2021 - 12/06/2021 Radiation Therapy   11/08/2021 through 12/06/2021 Site Technique Total Dose (Gy) Dose per Fx (Gy) Completed Fx Beam Energies  Breast, Right: Breast_R 3D 42.56/42.56 2.66 16/16 6XFFF  Breast, Right: Breast_R_Bst specialPort 10/10 2.5 4/4 12E, 15E      INTERVAL HISTORY:  Kimberly Gay to review her survivorship care plan detailing her treatment course for breast cancer, as well as monitoring long-term side effects of that treatment, education regarding health maintenance, screening, and overall wellness and health promotion.     Overall, Kimberly Gay reports feeling quite well.  She has no concerns today other than some mild fatigue.  She has f/u appt with Dr. Brantley Stage later this month.  She tells me that her breast is healing well since completing radiation.  REVIEW OF SYSTEMS:  Review of Systems  Constitutional:  Positive for fatigue. Negative for appetite change, chills, fever and unexpected weight change.  HENT:   Negative for hearing loss, lump/mass and trouble swallowing.   Eyes:  Negative for eye problems and icterus.  Respiratory:  Negative for chest tightness, cough and shortness of breath.   Cardiovascular:  Negative for chest pain, leg swelling and palpitations.  Gastrointestinal:  Negative for abdominal distention, abdominal pain, constipation, diarrhea, nausea and vomiting.  Endocrine:  Negative for hot flashes.  Genitourinary:  Negative for difficulty urinating.   Musculoskeletal:  Negative for arthralgias.  Skin:  Negative for itching and rash.  Neurological:  Negative for dizziness, extremity weakness, headaches and numbness.  Hematological:  Negative for adenopathy. Does not bruise/bleed easily.  Psychiatric/Behavioral:  Negative for depression. The patient is not nervous/anxious.   Breast: Denies any new nodularity, masses, tenderness, nipple changes, or nipple discharge.   ONCOLOGY TREATMENT TEAM:  1. Surgeon:  Dr. Brantley Stage at [facility] 2. Medical Oncologist: Dr. Lindi Adie  3. Radiation Oncologist: Dr. 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[place])    ectactic 3.5 cm 05/03/19 CTA chest   Brain aneurysm 04/03/2019   Breast cancer ([place]) 08/20/2021   Cancer ([place]) 1994   colon-resection   Cerebral aneurysm 04/03/2019   Cerebral aneurysm, nonruptured 2010   not treated due to location-monitor yearly   Dyspnea on exertion 04/03/2019   Dysrhythmia    afib    GERD (gastroesophageal reflux disease)    infrequent   Guaiac + stool 02/26/2020   History of hiatal hernia    Hypertension    well controlled   Incontinence of urine    Joint pain, knee    Palpitations 04/03/2019   Paroxysmal atrial fibrillation (HCC) CHA2DS2-VASc equals 4, not anticoagulated so far because of history of intracranial bleed and aneurysm 10/28/2019   s/p Watchman Device 03/25/20   Precordial chest pain 04/03/2019   Renal mass    s/p right inferior 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   Stroke [facility])    due  to aneurysm rupture in 2010    Urgency of urination    Past Surgical History:  Procedure Laterality Date   ABDOMINAL HYSTERECTOMY     ANEURYSM COILING  2010   cerebral coiling-no deficits   BACK SURGERY     BREAST EXCISIONAL BIOPSY Left  04/05/1994   BREAST LUMPECTOMY WITH RADIOACTIVE SEED LOCALIZATION Right 09/28/2021   Procedure: RIGHT BREAST LUMPECTOMY WITH RADIOACTIVE SEED LOCALIZATION;  Surgeon: Erroll Luna, MD;  Location: [place];  Service: General;  Laterality: Right;   CHOLECYSTECTOMY     CYSTOSCOPY  09/03/2012   Procedure: CYSTOSCOPY;  Surgeon: Ailene Rud, MD;  Location: [facility];  Service: Urology;  Laterality: N/A;   FLEXIBLE SIGMOIDOSCOPY N/A 11/24/2014   Procedure: FLEXIBLE SIGMOIDOSCOPY;  Surgeon: Garlan Fair, MD;  [facility];  Service: Endoscopy;  Laterality: N/A;   IR EMBO ART  VEN HEMORR LYMPH EXTRAV  INC GUIDE ROADMAPPING  11/18/2020   IR RADIOLOGIST EVAL & MGMT  09/23/2020   IR RADIOLOGIST EVAL & MGMT  12/15/2020   IR RADIOLOGIST EVAL & MGMT  02/23/2021   IR RENAL SUPRASEL UNI S&I MOD SED  11/18/2020   IR US GUIDE VASC ACCESS RIGHT  11/18/2020   LAMINECTOMY     LEFT ATRIAL APPENDAGE OCCLUSION     device implanted 03/25/20 at [place]  2000's   with cholecystectomy procedure   PILONIDAL CYST EXCISION     PUBOVAGINAL SLING  09/03/2012   Procedure: Gaynelle Arabian;  Surgeon: Ailene Rud, MD;  Location: [facility];  Service: Urology;  Laterality: N/A;  LYNX SLING    RADIOLOGY WITH ANESTHESIA Right 11/18/2020   Procedure: IR WITH ANESTHESIA RENAL CRYOABLATION;  Surgeon: Arne Cleveland, MD;  Location: [facility];  Service: Radiology;  Laterality: Right;     ALLERGIES:  Allergies  Allergen Reactions   Amoxicillin Swelling   Meperidine And Related Other (See Comments)    Unknown reaction    Wasp Venom Swelling     CURRENT MEDICATIONS:  Outpatient Encounter Medications as of 01/07/2022  Medication Sig   acetaminophen (TYLENOL) 500 MG tablet Take 500-1,000 mg by mouth every 6 (six) hours as needed for mild pain.   aspirin EC 81 MG tablet Take 1 tablet (81 mg total) by mouth daily. Swallow whole.   cetirizine (ZYRTEC)  10 MG tablet Take 10 mg by mouth daily as needed for allergies.   cholecalciferol (VITAMIN D) 1000 UNITS tablet Take 2,000 Units by mouth daily.    EPINEPHrine (EPIPEN) 0.3 mg/0.3 mL SOAJ injection Inject 0.3 mLs (0.3 mg total) into the muscle once. (Patient taking differently: Inject 0.3 mg into the muscle as needed for anaphylaxis.)   furosemide (LASIX) 20 MG tablet Take 20 mg by mouth daily.    hydrocortisone 2.5 % cream Apply 2.5 application topically daily as needed (hemorrhoids).   metoprolol succinate (TOPROL-XL) 50 MG 24 hr tablet Take 1 tablet (50 mg total) by mouth daily. Take with or immediately following a meal.   nitroGLYCERIN (NITROSTAT) 0.4 MG SL tablet Place 1 tablet (0.4 mg total) under the tongue every 5 (five) minutes as needed for chest pain.   pantoprazole (PROTONIX) 40 MG tablet Take 40 mg by mouth 2 (two) times daily.   phenylephrine-shark liver oil-mineral oil-petrolatum (PREPARATION H) 0.25-3-14-71.9 % rectal ointment Place 1 application rectally 2 (two) times daily as needed for hemorrhoids.   Polyethyl Glycol-Propyl Glycol (SYSTANE OP) Place 1 drop into both eyes 2 (two)  times daily.   [DISCONTINUED] gabapentin (NEURONTIN) 100 MG capsule Take 100 mg by mouth at bedtime.   [DISCONTINUED] oxyCODONE (OXY IR/ROXICODONE) 5 MG immediate release tablet Take 1 tablet (5 mg total) by mouth every 6 (six) hours as needed for severe pain.   No facility-administered encounter medications on file as of 01/07/2022.     ONCOLOGIC FAMILY HISTORY:  Family History  Problem Relation Age of Onset   Seizures Mother    Heart attack Father    Breast cancer Cousin    Ovarian cancer Cousin    Diabetes Other       SOCIAL HISTORY:  Social History   Socioeconomic History   Marital status: Widowed    Spouse name: Not on file   Number of children: Not on file   Years of education: Not on file   Highest education level: Not on file  Occupational History   Not on file  Tobacco Use    Smoking status: Never    Passive exposure: Never   Smokeless tobacco: Never  Vaping Use   Vaping Use: Never used  Substance and Sexual Activity   Alcohol use: No   Drug use: No   Sexual activity: Not on file  Other Topics Concern   Not on file  Social History Narrative   Not on file   Social Determinants of Health   Financial Resource Strain: Not on file  Food Insecurity: Not on file  Transportation Needs: Not on file  Physical Activity: Not on file  Stress: Not on file  Social Connections: Not on file  Intimate Partner Violence: Not At Risk   Fear of Current or Ex-Partner: No   Emotionally Abused: No   Physically Abused: No   Sexually Abused: No     OBSERVATIONS/OBJECTIVE:  BP (!) 158/80 (BP Location: Left Arm, Patient Position: Sitting)   Pulse 65   Temp 98.6 F (37 C) (Tympanic)   Resp 18   Ht '5\' 5"'$  (1.651 m)   Wt 171 lb 9.6 oz (77.8 kg)   SpO2 97%   BMI 28.56 kg/m  GENERAL: Patient is a well appearing female in no acute distress HEENT:  Sclerae anicteric.  Oropharynx clear and moist. No ulcerations or evidence of oropharyngeal candidiasis. Neck is supple.  NODES:  No cervical, supraclavicular, or axillary lymphadenopathy palpated.  BREAST EXAM:  Deferred. LUNGS:  Clear to auscultation bilaterally.  No wheezes or rhonchi. HEART:  Regular rate and rhythm. No murmur appreciated. ABDOMEN:  Soft, nontender.  Positive, normoactive bowel sounds. No organomegaly palpated. MSK:  No focal spinal tenderness to palpation. Full range of motion bilaterally in the upper extremities. EXTREMITIES:  No peripheral edema.   SKIN:  Clear with no obvious rashes or skin changes. No nail dyscrasia. NEURO:  Nonfocal. Well oriented.  Appropriate affect.   LABORATORY DATA:  None for this visit.  DIAGNOSTIC IMAGING:  None for this visit.      ASSESSMENT AND PLAN:  Ms.. Gay is a pleasant 81 y.o. female with Stage 0 right breast DCIS, ER+/PR+, diagnosed in 08/2021, treated with  lumpectomy, adjuvant radiation therapy.  She presents to the Survivorship Clinic for our initial meeting and routine follow-up post-completion of treatment for breast cancer.    1. Stage 0 right breast cancer:  Kimberly Gay is continuing to recover from definitive treatment for breast cancer. She will follow-up with [place] in 6 months with history and physical exam per surveillance protocol. Her mammogram is due 06/2022; orders placed today. Today, a  comprehensive survivorship care plan and treatment summary was reviewed with the patient today detailing her breast cancer diagnosis, treatment course, potential late/long-term effects of treatment, appropriate follow-up care with recommendations for the future, and patient education resources.  A copy of this summary, along with a letter will be sent to the patient's primary care provider via mail/fax/In Basket message after today's visit.    2. Bone health:  She was given education on specific activities to promote bone health.  3. Cancer screening:  Due to Kimberly Gay's history and her age, she should receive screening for skin cancers, colon cancer.  The information and recommendations are listed on the patient's comprehensive care plan/treatment summary and were reviewed in detail with the patient.    4. Health maintenance and wellness promotion: Kimberly Gay was encouraged to consume 5-7 servings of fruits and vegetables per day. We reviewed the "Nutrition Rainbow" handout.  She was also encouraged to engage in moderate to vigorous exercise for 30 minutes per day most days of the week. We discussed the [employer] fitness program, which is designed for cancer survivors to help them become more physically fit after cancer treatments.  She was instructed to limit her alcohol consumption and continue to abstain from tobacco use.     5. Support services/counseling: It is not uncommon for this period of the patient's cancer care trajectory to be one of many emotions  and stressors. She was given information regarding our available services and encouraged to contact me with any questions or for help enrolling in any of our support group/programs.    Follow up instructions:    -Return to cancer center in 6 months for f/u -Mammogram due in 06/2022 -Follow up with surgery in 1 year -She is welcome to return back to the Survivorship Clinic at any time; no additional follow-up needed at this time.  -Consider referral back to survivorship as a long-term survivor for continued surveillance  The patient was provided an opportunity to ask questions and all were answered. The patient agreed with the plan and demonstrated an understanding of the instructions.   Total encounter time:30 minutes*in face-to-face visit time, chart review, lab review, care coordination, order entry, and documentation of the encounter time.    Wilber Bihari, NP 01/07/22 10:55 AM Medical Oncology and Hematology [facility] [place] [address] Tel. [phone]    Fax. [phone]  *Total Encounter Time as defined by the Centers for Medicare and Medicaid Services includes, in addition to the face-to-face time of a patient visit (documented in the note above) non-face-to-face time: obtaining and reviewing outside history, ordering and reviewing medications, tests or procedures, care coordination (communications with other health care professionals or caregivers) and documentation in the medical record.

## 2022-01-17 ENCOUNTER — Ambulatory Visit
Admission: RE | Admit: 2022-01-17 | Discharge: 2022-01-17 | Disposition: A | Discharge status: Home / Self Care | Payer: Medicare PPO | Source: Ambulatory Visit | Attending: Adult Health | Admitting: Adult Health

## 2022-01-17 DIAGNOSIS — D0511 Intraductal carcinoma in situ of right breast: Secondary | ICD-10-CM | POA: Insufficient documentation

## 2022-01-17 NOTE — Progress Notes (Addendum)
  Radiation Oncology         [phone] ________________________________  Name: Kimberly Gay MRN: [id number]  Date of Service: 01/17/2022  DOB: 08-Jun-1941  Post Treatment Telephone Note  Diagnosis:   ER/PR positive Intermediate to high grade DCIS of the right breast  Intent: Curative  Radiation Treatment Dates: 11/08/2021 through 12/06/2021 Site Technique Total Dose (Gy) Dose per Fx (Gy) Completed Fx Beam Energies  Breast, Right: Breast_R 3D 42.56/42.56 2.66 16/16 6XFFF  Breast, Right: Breast_R_Bst specialPort 10/10 2.5 4/4 12E, 15E   Narrative: The patient tolerated radiation therapy relatively well. She developed fatigue and anticipated skin changes in the treatment field.   Impression/Plan: 1. ER/PR positive Intermediate to high grade DCIS of the right breast.  I was unable to reach the patient but left a voicemail and on the message, I discussed that we would be happy to continue to follow her as needed, but she will also continue to follow up with Dr. Lindi Adie in medical oncology. She was counseled to call if she had questions about skin care or measures to avoid sun exposure to this area.        Carola Rhine, PAC

## 2022-03-21 ENCOUNTER — Ambulatory Visit: Payer: Medicare PPO | Admitting: Cardiology

## 2022-03-21 ENCOUNTER — Encounter: Payer: Self-pay | Admitting: Cardiology

## 2022-03-21 VITALS — BP 120/64 | HR 54 | Ht 65.0 in | Wt 172.0 lb

## 2022-03-21 DIAGNOSIS — I671 Cerebral aneurysm, nonruptured: Secondary | ICD-10-CM

## 2022-03-21 DIAGNOSIS — I10 Essential (primary) hypertension: Secondary | ICD-10-CM | POA: Diagnosis not present

## 2022-03-21 DIAGNOSIS — R0609 Other forms of dyspnea: Secondary | ICD-10-CM | POA: Diagnosis not present

## 2022-03-21 DIAGNOSIS — C50919 Malignant neoplasm of unspecified site of unspecified female breast: Secondary | ICD-10-CM | POA: Insufficient documentation

## 2022-03-21 DIAGNOSIS — D0511 Intraductal carcinoma in situ of right breast: Secondary | ICD-10-CM

## 2022-03-21 DIAGNOSIS — K838 Other specified diseases of biliary tract: Secondary | ICD-10-CM | POA: Insufficient documentation

## 2022-03-21 DIAGNOSIS — K2100 Gastro-esophageal reflux disease with esophagitis, without bleeding: Secondary | ICD-10-CM

## 2022-03-21 DIAGNOSIS — R002 Palpitations: Secondary | ICD-10-CM

## 2022-03-21 NOTE — Progress Notes (Signed)
Cardiology Office Note:    Date:  03/21/2022   ID:  Kimberly Gay, DOB 11/22/1940, MRN [id number]  PCP:  Kimberly Nip, MD  Cardiologist:  Kimberly Campus, MD    Referring MD: Kimberly Nip, MD     History of Present Illness:    Kimberly Gay is a 81 y.o. female with past medical history significant for paroxysmal atrial fibrillation.  There was an issue of anticoagulation as she does have brain aneurysm and eventually she ended up getting Watchman device that was implanted in [place].  Also essential hypertension, dyslipidemia, colon cancer status postresection 1994, renal cancer, breast cancer status post recent lumpectomy with radiation thereafter.  She had difficulty tolerating radiation she became very weak tired exhausted to the point she had difficulty moving around, she also had few episode of atrial fibrillation while receiving radiation.  She has been without radiation for a month and since that time no palpitations.  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[place])    ectactic 3.5 cm 05/03/19 CTA chest   Brain aneurysm 04/03/2019   Breast cancer ([place]) 08/20/2021   Cancer ([place]) 1994   colon-resection   Cerebral aneurysm 04/03/2019   Cerebral aneurysm, nonruptured 2010   not treated due to location-monitor yearly   Dyspnea on exertion 04/03/2019   Dysrhythmia    afib    GERD (gastroesophageal reflux disease)    infrequent   Guaiac + stool 02/26/2020   History of hiatal hernia    Hypertension    well controlled   Incontinence of urine    Joint pain, knee    Palpitations 04/03/2019   Paroxysmal atrial fibrillation (HCC) CHA2DS2-VASc equals 4, not anticoagulated so far because of history of intracranial bleed and aneurysm 10/28/2019   s/p Watchman Device 03/25/20   Precordial chest pain 04/03/2019   Renal mass    s/p right inferior 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    Stroke [facility])    due to aneurysm rupture in 2010    Urgency of urination     Past Surgical History:  Procedure Laterality Date   ABDOMINAL HYSTERECTOMY     ANEURYSM COILING  2010   cerebral coiling-no deficits   BACK SURGERY     BREAST EXCISIONAL BIOPSY Left 04/05/1994   BREAST LUMPECTOMY WITH RADIOACTIVE SEED LOCALIZATION Right 09/28/2021   Procedure: RIGHT BREAST LUMPECTOMY WITH RADIOACTIVE SEED LOCALIZATION;  Surgeon: Kimberly Luna, MD;  Location: [place];  Service: General;  Laterality: Right;   CHOLECYSTECTOMY     CYSTOSCOPY  09/03/2012   Procedure: CYSTOSCOPY;  Surgeon: Kimberly Rud, MD;  Location: [place];  Service: Urology;  Laterality: N/A;   FLEXIBLE SIGMOIDOSCOPY N/A 11/24/2014   Procedure: FLEXIBLE SIGMOIDOSCOPY;  Surgeon: Kimberly Fair, MD;  [facility];  Service: Endoscopy;  Laterality: N/A;   IR EMBO ART  VEN HEMORR LYMPH EXTRAV  INC GUIDE ROADMAPPING  11/18/2020   IR RADIOLOGIST EVAL & MGMT  09/23/2020   IR RADIOLOGIST EVAL & MGMT  12/15/2020   IR RADIOLOGIST EVAL & MGMT  02/23/2021   IR RENAL SUPRASEL UNI S&I MOD SED  11/18/2020   IR US GUIDE VASC ACCESS RIGHT  11/18/2020   LAMINECTOMY     LEFT ATRIAL APPENDAGE OCCLUSION     device implanted 03/25/20 at [place]  2000's   with cholecystectomy procedure   PILONIDAL CYST EXCISION  PUBOVAGINAL SLING  09/03/2012   Procedure: Gaynelle Arabian;  Surgeon: Kimberly Rud, MD;  Location: [facility];  Service: Urology;  Laterality: N/A;  LYNX SLING    RADIOLOGY WITH ANESTHESIA Right 11/18/2020   Procedure: IR WITH ANESTHESIA RENAL CRYOABLATION;  Surgeon: Kimberly Cleveland, MD;  [facility];  Service: Radiology;  Laterality: Right;    Current Medications: Current Meds  Medication Sig   acetaminophen (TYLENOL) 500 MG tablet Take 500-1,000 mg by mouth every 6 (six) hours as needed for mild pain.   aspirin EC 81 MG tablet Take 1 tablet (81  mg total) by mouth daily. Swallow whole.   cetirizine (ZYRTEC) 10 MG tablet Take 10 mg by mouth daily as needed for allergies.   cholecalciferol (VITAMIN D) 1000 UNITS tablet Take 2,000 Units by mouth daily.    EPINEPHrine (EPIPEN) 0.3 mg/0.3 mL SOAJ injection Inject 0.3 mLs (0.3 mg total) into the muscle once. (Patient taking differently: Inject 0.3 mg into the muscle as needed for anaphylaxis.)   furosemide (LASIX) 20 MG tablet Take 20 mg by mouth daily.    hydrocortisone 2.5 % cream Apply 2.5 application topically daily as needed (hemorrhoids).   metoprolol succinate (TOPROL-XL) 50 MG 24 hr tablet Take 1 tablet (50 mg total) by mouth daily. Take with or immediately following a meal.   nitroGLYCERIN (NITROSTAT) 0.4 MG SL tablet Place 1 tablet (0.4 mg total) under the tongue every 5 (five) minutes as needed for chest pain.   pantoprazole (PROTONIX) 40 MG tablet Take 40 mg by mouth 2 (two) times daily.   phenylephrine-shark liver oil-mineral oil-petrolatum (PREPARATION H) 0.25-3-14-71.9 % rectal ointment Place 1 application rectally 2 (two) times daily as needed for hemorrhoids.   Polyethyl Glycol-Propyl Glycol (SYSTANE OP) Place 1 drop into both eyes 2 (two) times daily.     Allergies:   Amoxicillin, Meperidine and related, and Wasp venom   Social History   Socioeconomic History   Marital status: Widowed    Spouse name: Not on file   Number of children: Not on file   Years of education: Not on file   Highest education level: Not on file  Occupational History   Not on file  Tobacco Use   Smoking status: Never    Passive exposure: Never   Smokeless tobacco: Never  Vaping Use   Vaping Use: Never used  Substance and Sexual Activity   Alcohol use: No   Drug use: No   Sexual activity: Not on file  Other Topics Concern   Not on file  Social History Narrative   Not on file   Social Determinants of Health   Financial Resource Strain: Not on file  Food Insecurity: Not on file   Transportation Needs: Not on file  Physical Activity: Not on file  Stress: Not on file  Social Connections: Not on file     Family History: The patient's family history includes Breast cancer in her cousin; Diabetes in an other family member; Heart attack in her father; Ovarian cancer in her cousin; Seizures in her mother. ROS:   Please see the history of present illness.    All 14 point review of systems negative except as described per history of present illness  EKGs/Labs/Other Studies Reviewed:      Recent Labs: 09/22/2021: BUN 20; Creatinine, Ser 1.03; Hemoglobin 12.8; Platelets 240; Potassium 4.0; Sodium 139  Recent Lipid Panel No results found for: "CHOL", "TRIG", "HDL", "CHOLHDL", "VLDL", "LDLCALC", "LDLDIRECT"  Physical Exam:  VS:  BP 120/64 (BP Location: Left Arm, Patient Position: Sitting)   Pulse (!) 54   Ht '5\' 5"'$  (1.651 m)   Wt 172 lb 0.6 oz (78 kg)   SpO2 94%   BMI 28.63 kg/m     Wt Readings from Last 3 Encounters:  03/21/22 172 lb 0.6 oz (78 kg)  01/07/22 171 lb 9.6 oz (77.8 kg)  10/28/21 179 lb 4 oz (81.3 kg)     GEN:  Well nourished, well developed in no acute distress HEENT: Normal NECK: No JVD; No carotid bruits LYMPHATICS: No lymphadenopathy CARDIAC: RRR, no murmurs, no rubs, no gallops RESPIRATORY:  Clear to auscultation without rales, wheezing or rhonchi  ABDOMEN: Soft, non-tender, non-distended MUSCULOSKELETAL:  No edema; No deformity  SKIN: Warm and dry LOWER EXTREMITIES: no swelling NEUROLOGIC:  Alert and oriented x 3 PSYCHIATRIC:  Normal affect   ASSESSMENT:    1. Gastroesophageal reflux disease with esophagitis, unspecified whether hemorrhage   2. Primary hypertension   3. Cerebral aneurysm, nonruptured   4. Dyspnea on exertion   5. Palpitations   6. Ductal carcinoma in situ (DCIS) of right breast    PLAN:    In order of problems listed above:  Paroxysmal atrial fibrillation.  What I will be more fragment doing a radiation  therapy now quiet down.  She is not anticoagulated because of brain aneurysm but she does have Watchman device. Central hypertension blood pressure well controlled continue present management. Cerebral aneurysm without rupture followed by internal medicine team Palpitations which are related to atrial fibrillation however I elected to simply watchful waiting I see her back in my office in about 5 months to see if she got recurrences of atrial fibrillation that we talk about more advanced therapy which may include antiarrhythmic therapy or ablation. Dyslipidemia I did review K PN which show LDL 101 HDL 55.  I will continue present management.   Medication Adjustments/Labs and Tests Ordered: Current medicines are reviewed at length with the patient today.  Concerns regarding medicines are outlined above.  No orders of the defined types were placed in this encounter.  Medication changes: No orders of the defined types were placed in this encounter.   Signed, Park Liter, MD, [facility] 03/21/2022 10:43 AM    [place]

## 2022-03-21 NOTE — Patient Instructions (Signed)
Medication Instructions:  Your physician recommends that you continue on your current medications as directed. Please refer to the Current Medication list given to you today.  *If you need a refill on your cardiac medications before your next appointment, please call your pharmacy*   Lab Work: None If you have labs (blood work) drawn today and your tests are completely normal, you will receive your results only by: [place] (if you have MyChart) OR A paper copy in the mail If you have any lab test that is abnormal or we need to change your treatment, we will call you to review the results.   Testing/Procedures: None   Follow-Up: At [facility], you and your health needs are our priority.  As part of our continuing mission to provide you with exceptional heart care, we have created designated Provider Care Teams.  These Care Teams include your primary Cardiologist (physician) and Advanced Practice Providers (APPs -  Physician Assistants and Nurse Practitioners) who all work together to provide you with the care you need, when you need it.  We recommend signing up for the patient portal called "MyChart".  Sign up information is provided on this After Visit Summary.  MyChart is used to connect with patients for Virtual Visits (Telemedicine).  Patients are able to view lab/test results, encounter notes, upcoming appointments, etc.  Non-urgent messages can be sent to your provider as well.   To learn more about what you can do with MyChart, go to [email or web address].    Your next appointment:   6 month(s)  The format for your next appointment:   In Person  Provider:   Jenne Campus, MD    Other Instructions   Important Information About Sugar

## 2022-04-20 ENCOUNTER — Other Ambulatory Visit: Payer: Self-pay | Admitting: Interventional Radiology

## 2022-04-20 DIAGNOSIS — N2889 Other specified disorders of kidney and ureter: Secondary | ICD-10-CM

## 2022-04-26 ENCOUNTER — Other Ambulatory Visit: Payer: Self-pay | Admitting: Physician Assistant

## 2022-04-27 NOTE — Telephone Encounter (Signed)
Needs to be sent to [facility] MD's

## 2022-05-04 ENCOUNTER — Telehealth: Payer: Self-pay | Admitting: Cardiology

## 2022-05-04 MED ORDER — NITROGLYCERIN 0.4 MG SL SUBL: 0.4000 mg | SUBLINGUAL_TABLET | SUBLINGUAL | 11 refills | Status: AC | PRN

## 2022-05-04 NOTE — Telephone Encounter (Signed)
*  STAT* If patient is at the pharmacy, call can be transferred to refill team.   1. Which medications need to be refilled? (please list name of each medication and dose if known) nitroGLYCERIN (NITROSTAT) 0.4 MG SL tablet  2. Which pharmacy/location (including street and city if local pharmacy) is medication to be sent to? [facility] [place]  3. Do they need a 30 day or 90 day supply? 30 day  Patient states her bottle is expired.

## 2022-05-11 ENCOUNTER — Ambulatory Visit (HOSPITAL_COMMUNITY): Payer: Medicare PPO

## 2022-05-20 ENCOUNTER — Ambulatory Visit (HOSPITAL_COMMUNITY)
Admission: RE | Admit: 2022-05-20 | Discharge: 2022-05-20 | Disposition: A | Discharge status: Home / Self Care | Payer: Medicare PPO | Source: Ambulatory Visit | Attending: Interventional Radiology | Admitting: Interventional Radiology

## 2022-05-20 DIAGNOSIS — N2889 Other specified disorders of kidney and ureter: Secondary | ICD-10-CM | POA: Insufficient documentation

## 2022-05-20 MED ORDER — GADOBUTROL 1 MMOL/ML IV SOLN
7.0000 mL | Freq: Once | INTRAVENOUS | Status: AC | PRN
  Administered 2022-05-20: 7 mL via INTRAVENOUS

## 2022-05-23 ENCOUNTER — Encounter: Payer: Self-pay | Admitting: Lab

## 2022-05-23 ENCOUNTER — Ambulatory Visit
Admission: RE | Admit: 2022-05-23 | Discharge: 2022-05-23 | Disposition: A | Discharge status: Home / Self Care | Payer: Medicare PPO | Source: Ambulatory Visit | Attending: Interventional Radiology | Admitting: Interventional Radiology

## 2022-05-23 DIAGNOSIS — N2889 Other specified disorders of kidney and ureter: Secondary | ICD-10-CM

## 2022-05-23 HISTORY — PX: IR RADIOLOGIST EVAL & MGMT: IMG5224

## 2022-05-23 NOTE — Progress Notes (Signed)
Chief Complaint: Right renal malignancy s/p cryoablation  Referring Physician(s): Jacalyn Lefevre D  History of Present Illness: Kimberly Gay is a 81 y.o. female with remote history of colon cancer s/p resection, presented with RUQ pain in January, 2022.  MRI of abdomen subsequently showed a solid right renal mass.   She underwent cryoablation and biopsy of this mass on 11/18/2020.  The procedure was complicated by rapidly enlarging hematoma which required angiogram and embolization.  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   She had a MRI of the abdomen performed on 02/19/2021 and 05/20/2022 which showed no evidence of recurrent tumor or new renal masses.  She feels well today and denies any flank pain or hematuria.  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[place])    ectactic 3.5 cm 05/03/19 CTA chest   Brain aneurysm 04/03/2019   Breast cancer ([place]) 08/20/2021   Cancer ([place]) 1994   colon-resection   Cerebral aneurysm 04/03/2019   Cerebral aneurysm, nonruptured 2010   not treated due to location-monitor yearly   Dyspnea on exertion 04/03/2019   Dysrhythmia    afib    GERD (gastroesophageal reflux disease)    infrequent   Guaiac + stool 02/26/2020   History of hiatal hernia    Hypertension    well controlled   Incontinence of urine    Joint pain, knee    Palpitations 04/03/2019   Paroxysmal atrial fibrillation (HCC) CHA2DS2-VASc equals 4, not anticoagulated so far because of history of intracranial bleed and aneurysm 10/28/2019   s/p Watchman Device 03/25/20   Precordial chest pain 04/03/2019   Renal mass    s/p right inferior 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 2010    Urgency of urination     Past Surgical History:  Procedure Laterality Date   ABDOMINAL  HYSTERECTOMY     ANEURYSM COILING  2010   cerebral coiling-no deficits   BACK SURGERY     BREAST EXCISIONAL BIOPSY Left 04/05/1994   BREAST LUMPECTOMY WITH RADIOACTIVE SEED LOCALIZATION Right 09/28/2021   Procedure: RIGHT BREAST LUMPECTOMY WITH RADIOACTIVE SEED LOCALIZATION;  Surgeon: Erroll Luna, MD;  Location: [facility];  Service: General;  Laterality: Right;   CHOLECYSTECTOMY     CYSTOSCOPY  09/03/2012   Procedure: CYSTOSCOPY;  Surgeon: Ailene Rud, MD;  Location: [facility];  Service: Urology;  Laterality: N/A;   FLEXIBLE SIGMOIDOSCOPY N/A 11/24/2014   Procedure: FLEXIBLE SIGMOIDOSCOPY;  Surgeon: Garlan Fair, MD;  Location: [facility];  Service: Endoscopy;  Laterality: N/A;   IR EMBO ART  VEN HEMORR LYMPH EXTRAV  INC GUIDE ROADMAPPING  11/18/2020   IR RADIOLOGIST EVAL & MGMT  09/23/2020   IR RADIOLOGIST EVAL & MGMT  12/15/2020   IR RADIOLOGIST EVAL & MGMT  02/23/2021   IR RENAL SUPRASEL UNI S&I MOD SED  11/18/2020   IR US GUIDE VASC ACCESS RIGHT  11/18/2020   LAMINECTOMY     LEFT ATRIAL APPENDAGE OCCLUSION     device implanted 03/25/20 at [facility]  2000's   with cholecystectomy procedure   PILONIDAL CYST EXCISION     PUBOVAGINAL SLING  09/03/2012   Procedure: Gaynelle Arabian;  Surgeon: Ailene Rud, MD;  Location: [facility];  Service:  Urology;  Laterality: N/A;  LYNX SLING    RADIOLOGY WITH ANESTHESIA Right 11/18/2020   Procedure: IR WITH ANESTHESIA RENAL CRYOABLATION;  Surgeon: Arne Cleveland, MD;  Location: [facility];  Service: Radiology;  Laterality: Right;    Allergies: Amoxicillin, Meperidine and related, and Wasp venom  Medications: Prior to Admission medications   Medication Sig Start Date End Date Taking? Authorizing Provider  acetaminophen (TYLENOL) 500 MG tablet Take 500-1,000 mg by mouth every 6 (six) hours as needed for mild pain.    [provider]  aspirin EC 81 MG tablet Take 1  tablet (81 mg total) by mouth daily. Swallow whole. 02/14/20   Park Liter, MD  cetirizine (ZYRTEC) 10 MG tablet Take 10 mg by mouth daily as needed for allergies.    [provider]  cholecalciferol (VITAMIN D) 1000 UNITS tablet Take 2,000 Units by mouth daily.     [provider]  EPINEPHrine (EPIPEN) 0.3 mg/0.3 mL SOAJ injection Inject 0.3 mLs (0.3 mg total) into the muscle once. Patient taking differently: Inject 0.3 mg into the muscle as needed for anaphylaxis. 08/09/13   Linton Flemings, MD  furosemide (LASIX) 20 MG tablet Take 20 mg by mouth daily.  02/02/19   [provider]  hydrocortisone 2.5 % cream Apply 2.5 application topically daily as needed (hemorrhoids). 12/23/19   [provider]  metoprolol succinate (TOPROL-XL) 50 MG 24 hr tablet Take 1 tablet (50 mg total) by mouth daily. Take with or immediately following a meal. 09/08/21 03/21/22  Park Liter, MD  nitroGLYCERIN (NITROSTAT) 0.4 MG SL tablet Place 1 tablet (0.4 mg total) under the tongue every 5 (five) minutes as needed for chest pain. 05/04/22 08/02/22  [place] Liter, MD  pantoprazole (PROTONIX) 40 MG tablet Take 40 mg by mouth 2 (two) times daily.    [provider]  phenylephrine-shark liver oil-mineral oil-petrolatum (PREPARATION H) 0.25-3-14-71.9 % rectal ointment Place 1 application rectally 2 (two) times daily as needed for hemorrhoids.    [provider]  Polyethyl Glycol-Propyl Glycol (SYSTANE OP) Place 1 drop into both eyes 2 (two) times daily.    [provider]     Family History  Problem Relation Age of Onset   Seizures Mother    Heart attack Father    Breast cancer Cousin    Ovarian cancer Cousin    Diabetes Other     Social History   Socioeconomic History   Marital status: Widowed    Spouse name: Not on file   Number of children: Not on file   Years of education: Not on file   Highest education level: Not on file  Occupational  History   Not on file  Tobacco Use   Smoking status: Never    Passive exposure: Never   Smokeless tobacco: Never  Vaping Use   Vaping Use: Never used  Substance and Sexual Activity   Alcohol use: No   Drug use: No   Sexual activity: Not on file  Other Topics Concern   Not on file  Social History Narrative   Not on file   Social Determinants of Health   Financial Resource Strain: Not on file  Food Insecurity: Not on file  Transportation Needs: Not on file  Physical Activity: Not on file  Stress: Not on file  Social Connections: Not on file   Review of Systems  Review of Systems: A 12 point ROS discussed and pertinent positives are indicated in the HPI above.  All other systems are negative.  Advance Care Plan: The advanced care plan/surrogate decision maker was discussed at the time of visit and the patient did not wish to discuss or was not able to name a surrogate decision maker or provide an advance care plan.   Physical Exam No direct physical exam was performed (except for noted visual exam findings with Video Visits).    Vital Signs: There were no vitals taken for this visit.  Imaging: MR ABDOMEN WWO CONTRAST  Result Date: 05/22/2022 CLINICAL DATA:  Follow-up right renal lesion (low-grade oncocytoma). History of cryoablation. EXAM: MRI ABDOMEN WITHOUT AND WITH CONTRAST TECHNIQUE: Multiplanar multisequence MR imaging of the abdomen was performed both before and after the administration of intravenous contrast. CONTRAST:  30m GADAVIST GADOBUTROL 1 MMOL/ML IV SOLN COMPARISON:  Prior MRI 09/07/2021 FINDINGS: Lower chest: The lung bases are clear of an acute process. No pulmonary lesions or pleural or pericardial effusion. Hepatobiliary: Stable mild central intrahepatic biliary dilatation and moderate common bile duct dilatation. The common bile duct measures a maximum 10 mm in the head of the pancreas but it appears to taper normally to the ampulla. This could be post  cholecystectomy dilatation. No common bile duct stones are identified. Stable partially exophytic 10 mm cyst projecting off the left hepatic lobe anteriorly. A few other tiny cysts are noted. Pancreas:  No mass, inflammation or ductal dilatation. Spleen:  Normal size. No focal lesions. Adrenals/Urinary Tract:  The adrenal glands are. Stable post ablation changes involving the right kidney no findings suspicious for residual or recurrent tumor. Small bilateral simple nonenhancing renal cysts are noted. No follow-up is necessary. 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 Stomach/Bowel: The stomach, duodenum, visualized small bowel and visualized colon are unremarkable. Vascular/Lymphatic: The aorta and branch vessels are patent. The major venous structures are patent. No mesenteric or retroperitoneal mass or adenopathy. Other:  No ascites or abdominal wall hernia. Musculoskeletal: No significant bony findings. IMPRESSION: 1. Stable post ablation changes involving the right kidney no findings suspicious for residual or recurrent tumor. 2. Stable mild central intrahepatic biliary dilatation and moderate common bile duct dilatation. This is likely due to prior cholecystectomy. No common bile duct stones. 3. Small bilateral simple renal cysts. No follow-up is necessary. Electronically Signed   By: PMarijo SanesM.D.   On: 05/22/2022 15:07    Labs:  CBC: Recent Labs    09/22/21 0949  WBC 7.9  HGB 12.8  HCT 41.5  PLT 240    COAGS: No results for input(s): "INR", "APTT" in the last 8760 hours.  BMP: Recent Labs    09/22/21 0949  NA 139  K 4.0  CL 105  CO2 28  GLUCOSE 87  BUN 20  CALCIUM 10.0  CREATININE 1.03*  GFRNONAA 55*    LIVER FUNCTION TESTS: No results for input(s): "BILITOT", "AST", "ALT", "ALKPHOS", "PROT", "ALBUMIN" in the last 8760 hours.  TUMOR MARKERS: No results for input(s): "AFPTM", "CEA", "CA199", "CHROMGRNA" in the last 8760 hours.  Assessment and  Plan:  81year old woman s/p cryoablation of right renal mass returns to IR clinic for follow up.  Her most recent MRI shows no evidence of reoccurrence.  No new tumors were identified.  I reviewed the findings of the MRI with her.  She is doing well at this time.  All her questions were answered.  We will perform a follow up MRI in 6 months.  Plan: - MRI abdomen in 6 months - Follow up  clinic visit  Thank you for this interesting consult.  I greatly enjoyed meeting JISSELLE POTH and look forward to participating in their care.  A copy of this report was sent to the requesting provider on this date.  Electronically Signed: Paula Libra Londyn Wotton 05/23/2022, 1:21 PM   I spent a total of 15 Minutes in remote  clinical consultation, greater than 50% of which was counseling/coordinating care for Renal malignancy.    Visit type: Audio only (telephone). Audio (no video) only due to video not being required for this appointment.. Alternative for in-person consultation at [facility], [address] [place], [place], [place]. This format is felt to be most appropriate for this patient at this time.  All issues noted in this document were discussed and addressed.

## 2022-05-31 ENCOUNTER — Telehealth: Payer: Self-pay | Admitting: Adult Health

## 2022-05-31 NOTE — Telephone Encounter (Signed)
Rescheduled appointment per provider template. Left voicemail. 

## 2022-07-07 ENCOUNTER — Ambulatory Visit
Admission: RE | Admit: 2022-07-07 | Discharge: 2022-07-07 | Disposition: A | Discharge status: Home / Self Care | Payer: Medicare PPO | Source: Ambulatory Visit | Attending: Adult Health | Admitting: Adult Health

## 2022-07-07 DIAGNOSIS — D0511 Intraductal carcinoma in situ of right breast: Secondary | ICD-10-CM

## 2022-07-07 HISTORY — DX: Personal history of irradiation: Z92.3

## 2022-07-11 ENCOUNTER — Other Ambulatory Visit: Payer: Self-pay

## 2022-07-11 ENCOUNTER — Inpatient Hospital Stay: Payer: Medicare PPO | Attending: Adult Health | Admitting: Adult Health

## 2022-07-11 ENCOUNTER — Encounter: Payer: Self-pay | Admitting: Adult Health

## 2022-07-11 VITALS — BP 144/77 | HR 57 | Temp 97.8°F | Resp 18 | Ht 65.0 in | Wt 169.4 lb

## 2022-07-11 DIAGNOSIS — Z90710 Acquired absence of both cervix and uterus: Secondary | ICD-10-CM | POA: Insufficient documentation

## 2022-07-11 DIAGNOSIS — I10 Essential (primary) hypertension: Secondary | ICD-10-CM | POA: Diagnosis not present

## 2022-07-11 DIAGNOSIS — Z8041 Family history of malignant neoplasm of ovary: Secondary | ICD-10-CM | POA: Diagnosis not present

## 2022-07-11 DIAGNOSIS — Z803 Family history of malignant neoplasm of breast: Secondary | ICD-10-CM | POA: Diagnosis not present

## 2022-07-11 DIAGNOSIS — D0511 Intraductal carcinoma in situ of right breast: Secondary | ICD-10-CM | POA: Diagnosis not present

## 2022-07-11 NOTE — Progress Notes (Signed)
[facility] Follow up:    Kimberly Mc, NP [place] [address]   DIAGNOSIS:  Cancer Staging  Ductal carcinoma in situ (DCIS) of right breast Staging form: Breast, AJCC 8th Edition - Clinical stage from 09/23/2021: Stage 0 (cTis (DCIS), cN0, cM0, GX, ER+, PR+) - Signed by Nicholas Lose, MD on 09/23/2021 Histologic grading system: 3 grade system   SUMMARY OF ONCOLOGIC HISTORY: Oncology History  Ductal carcinoma in situ (DCIS) of right breast  08/20/2021 Initial Diagnosis   Screening mammogram: right breast calcifications. Diagnostic mammogram: Indeterminate 1.2 cm group of retroareolar right breast calcifications. Biopsy: intraductal papilloma with DCIS, ER+(100%)/PR+(20%).    09/23/2021 Cancer Staging   Staging form: Breast, AJCC 8th Edition - Clinical stage from 09/23/2021: Stage 0 (cTis (DCIS), cN0, cM0, GX, ER+, PR+) - Signed by Nicholas Lose, MD on 09/23/2021 Histologic grading system: 3 grade system   09/28/2021 Surgery   09/28/21: Right Lumpectomy: IG-HG DCIS Margin Positive We discussed her case in the breast tumor board and decided that she does not need additional surgery.   11/08/2021 - 12/06/2021 Radiation Therapy   11/08/2021 through 12/06/2021 Site Technique Total Dose (Gy) Dose per Fx (Gy) Completed Fx Beam Energies  Breast, Right: Breast_R 3D 42.56/42.56 2.66 16/16 6XFFF  Breast, Right: Breast_R_Bst specialPort 10/10 2.5 4/4 12E, 15E      CURRENT THERAPY:  INTERVAL HISTORY: Kimberly Gay 81 y.o. female returns for follow-up of her history of breast cancer.  Since her last visit she underwent a digital diagnostic bilateral mammogram with tomosynthesis on July 07, 2022 that demonstrated no evidence of malignancy and breast density category B.  Kimberly Gay has been struggling with residual fatigue.  She has brain fog.  She does feel like it is slowly improving but has remained persistent.  She also has some back pain and stomach issues  that she is working with her primary care provider about.    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[facility]) 09/01/2021   Decreased estrogen level 12/30/2020   Dilated cbd, acquired 12/30/2020   Disorder of kidney and ureter, unspecified 12/30/2020   Dysphagia 12/30/2020   External hemorrhoids 12/30/2020   Osteopenia 12/30/2020   Hypercalcemia 12/30/2020   History of malignant neoplasm of colon 12/30/2020   Vitamin D deficiency 12/30/2020   Paresthesia 12/30/2020   Renal mass, right 11/18/2020   Presence of Watchman left atrial appendage closure device 06/29/2020   Urgency of urination    Joint pain, knee    Incontinence of urine    Hypertension    Gastritis    Paroxysmal atrial fibrillation (HCC) CHA2DS2-VASc equals 4, not anticoagulated so far because of history of intracranial bleed and aneurysm 02/26/2020   Atrial fibrillation ([facility]) 10/28/2019   Palpitations 04/03/2019   Dyspnea on exertion 04/03/2019   Precordial chest pain 04/03/2019   Cerebral aneurysm, nonruptured 2010    is allergic to amoxicillin, meperidine and related, and wasp venom.  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[facility])    ectactic 3.5 cm 05/03/19 CTA chest   Brain aneurysm 04/03/2019   Breast cancer ([facility]) 08/20/2021   Cancer ([facility]) 1994   colon-resection   Cerebral aneurysm 04/03/2019   Cerebral aneurysm, nonruptured 2010   not  treated due to location-monitor yearly   Dyspnea on exertion 04/03/2019   Dysrhythmia    afib    GERD (gastroesophageal reflux disease)    infrequent   Guaiac + stool 02/26/2020   History of hiatal hernia    Hypertension    well controlled   Incontinence of urine    Joint pain, knee    Palpitations 04/03/2019    Paroxysmal atrial fibrillation (HCC) CHA2DS2-VASc equals 4, not anticoagulated so far because of history of intracranial bleed and aneurysm 10/28/2019   s/p Watchman Device 03/25/20   Personal history of radiation therapy    Precordial chest pain 04/03/2019   Renal mass    s/p right inferior 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   Stroke [facility])    due to aneurysm rupture in 2010    Urgency of urination     SURGICAL HISTORY: Past Surgical History:  Procedure Laterality Date   ABDOMINAL HYSTERECTOMY     ANEURYSM COILING  2010   cerebral coiling-no deficits   BACK SURGERY     BREAST BIOPSY Right 08/20/2021   BREAST EXCISIONAL BIOPSY Left 04/05/1994   BREAST LUMPECTOMY     BREAST LUMPECTOMY WITH RADIOACTIVE SEED LOCALIZATION Right 09/28/2021   Procedure: RIGHT BREAST LUMPECTOMY WITH RADIOACTIVE SEED LOCALIZATION;  Surgeon: Erroll Luna, MD;  Location: [facility];  Service: General;  Laterality: Right;   CHOLECYSTECTOMY     CYSTOSCOPY  09/03/2012   Procedure: CYSTOSCOPY;  Surgeon: Ailene Rud, MD;  Location: [facility];  Service: Urology;  Laterality: N/A;   FLEXIBLE SIGMOIDOSCOPY N/A 11/24/2014   Procedure: FLEXIBLE SIGMOIDOSCOPY;  Surgeon: Garlan Fair, MD;  Location: [facility];  Service: Endoscopy;  Laterality: N/A;   IR EMBO ART  VEN HEMORR LYMPH EXTRAV  INC GUIDE ROADMAPPING  11/18/2020   IR RADIOLOGIST EVAL & MGMT  09/23/2020   IR RADIOLOGIST EVAL & MGMT  12/15/2020   IR RADIOLOGIST EVAL & MGMT  02/23/2021   IR RADIOLOGIST EVAL & MGMT  05/23/2022   IR RENAL SUPRASEL UNI S&I MOD SED  11/18/2020   IR US GUIDE VASC ACCESS RIGHT  11/18/2020   LAMINECTOMY     LEFT ATRIAL APPENDAGE OCCLUSION     device implanted 03/25/20 at [facility]  2000's   with cholecystectomy procedure   PILONIDAL CYST EXCISION     PUBOVAGINAL SLING  09/03/2012   Procedure: Gaynelle Arabian;  Surgeon: Ailene Rud, MD;  Location: [facility];  Service: Urology;  Laterality: N/A;  LYNX SLING    RADIOLOGY WITH ANESTHESIA Right 11/18/2020   Procedure: IR WITH ANESTHESIA RENAL CRYOABLATION;  Surgeon: Arne Cleveland, MD;  Location: [facility];  Service: Radiology;  Laterality: Right;    SOCIAL HISTORY: Social History   Socioeconomic History   Marital status: Widowed    Spouse name: Not on file   Number of children: Not on file   Years of education: Not on file   Highest education level: Not on file  Occupational History   Not on file  Tobacco Use   Smoking status: Never    Passive exposure: Never   Smokeless tobacco: Never  Vaping Use   Vaping Use: Never used  Substance and Sexual Activity   Alcohol use: No   Drug use: No   Sexual activity: Not on file  Other Topics Concern   Not on file  Social History Narrative   Not on file  Social Determinants of Health   Financial Resource Strain: Not on file  Food Insecurity: Not on file  Transportation Needs: Not on file  Physical Activity: Not on file  Stress: Not on file  Social Connections: Not on file  Intimate Partner Violence: Not At Risk (10/28/2021)   Humiliation, Afraid, Rape, and Kick questionnaire    Fear of Current or Ex-Partner: No    Emotionally Abused: No    Physically Abused: No    Sexually Abused: No    FAMILY HISTORY: Family History  Problem Relation Age of Onset   Seizures Mother    Heart attack Father    Breast cancer Cousin    Ovarian cancer Cousin    Diabetes Other     Review of Systems  Constitutional:  Positive for fatigue. Negative for appetite change, chills, fever and unexpected weight change.  HENT:   Negative for hearing loss, lump/mass and trouble swallowing.   Eyes:  Negative for eye problems and icterus.  Respiratory:  Negative for chest tightness, cough and shortness of breath.   Cardiovascular:  Negative for chest pain, leg swelling and palpitations.  Gastrointestinal:   Negative for abdominal distention, abdominal pain, constipation, diarrhea, nausea and vomiting.  Endocrine: Negative for hot flashes.  Genitourinary:  Negative for difficulty urinating.   Musculoskeletal:  Positive for back pain. Negative for arthralgias.  Skin:  Negative for itching and rash.  Neurological:  Negative for dizziness, extremity weakness, headaches and numbness.  Hematological:  Negative for adenopathy. Does not bruise/bleed easily.  Psychiatric/Behavioral:  Negative for depression. The patient is not nervous/anxious.       PHYSICAL EXAMINATION  ECOG PERFORMANCE STATUS: 1 - Symptomatic but completely ambulatory  Vitals:   07/11/22 1043  BP: (!) 144/77  Pulse: (!) 57  Resp: 18  Temp: 97.8 F (36.6 C)  SpO2: 100%    Physical Exam Constitutional:      General: She is not in acute distress.    Appearance: Normal appearance. She is not toxic-appearing.  HENT:     Head: Normocephalic and atraumatic.  Eyes:     General: No scleral icterus. Cardiovascular:     Rate and Rhythm: Normal rate and regular rhythm.     Pulses: Normal pulses.     Heart sounds: Normal heart sounds.  Pulmonary:     Effort: Pulmonary effort is normal.     Breath sounds: Normal breath sounds.  Chest:     Comments: Right breast status postlumpectomy and radiation no sign of local recurrence left breast is benign. Abdominal:     General: Abdomen is flat. Bowel sounds are normal. There is no distension.     Palpations: Abdomen is soft.     Tenderness: There is no abdominal tenderness.  Musculoskeletal:        General: No swelling.     Cervical back: Neck supple.  Lymphadenopathy:     Cervical: No cervical adenopathy.  Skin:    General: Skin is warm and dry.     Findings: No rash.  Neurological:     General: No focal deficit present.     Mental Status: She is alert.  Psychiatric:        Mood and Affect: Mood normal.        Behavior: Behavior normal.     LABORATORY DATA: None for  this visit   ASSESSMENT and THERAPY PLAN:   Ductal carcinoma in situ (DCIS) of right breast Kimberly Gay is an 81 year old woman with history of ductal carcinoma in  situ diagnosed in January 2023 status post right lumpectomy and adjuvant radiation that she completed in May 2023.  Kimberly Gay has no clinical or radiographic signs of breast cancer recurrence.  She has continued on observation alone.  Her most recent mammogram was in November which was negative and I recommended that she continue with annual mammography.  We discussed her residual fatigue and history of radiation induced fatigue.  I let her know that it can take as long as a year for all of the side effects from her treatment to improve.  She tells me that it is gradually improving which is good news.  Should it worsen or should she persistently remain this fatigued I recommended that she talk to her primary care about additional lab testing such as CBC, thyroid, vitamin D level, or B12 levels.  Kimberly Gay is going to see her primary care provider in the next couple weeks and states that she will request labs if needed at that time.  I recommended that Kimberly Gay consider taking ground root ginseng 1000 mg a day as this is the recommendation from the [place] of clinical oncology for cancer related fatigue.  I also suggested she continue to be active as she is able.  I reviewed with her that her breast cancer was noninvasive therefore it is incredibly unlikely for her breast cancer to spread anywhere because it did not go outside of the breast duct.  She verbalized understanding of this.  We will see her back in 1 year for continue surveillance.     All questions were answered. The patient knows to call the clinic with any problems, questions or concerns. We can certainly see the patient much sooner if necessary.  Total encounter time:30 minutes*in face-to-face visit time, chart review, lab review, care coordination, order entry, and  documentation of the encounter time.    Wilber Bihari, NP 07/11/22 11:47 AM Medical Oncology and Hematology [facility] [place], [place] [address] Tel. [phone]    Fax. [phone]  *Total Encounter Time as defined by the Centers for Medicare and Medicaid Services includes, in addition to the face-to-face time of a patient visit (documented in the note above) non-face-to-face time: obtaining and reviewing outside history, ordering and reviewing medications, tests or procedures, care coordination (communications with other health care professionals or caregivers) and documentation in the medical record.

## 2022-07-11 NOTE — Assessment & Plan Note (Signed)
Kimberly Gay is an 81 year old woman with history of ductal carcinoma in situ diagnosed in January 2023 status post right lumpectomy and adjuvant radiation that she completed in May 2023.  Kimberly Gay has no clinical or radiographic signs of breast cancer recurrence.  She has continued on observation alone.  Her most recent mammogram was in November which was negative and I recommended that she continue with annual mammography.  We discussed her residual fatigue and history of radiation induced fatigue.  I let her know that it can take as long as a year for all of the side effects from her treatment to improve.  She tells me that it is gradually improving which is good news.  Should it worsen or should she persistently remain this fatigued I recommended that she talk to her primary care about additional lab testing such as CBC, thyroid, vitamin D level, or B12 levels.  Kimberly Gay is going to see her primary care provider in the next couple weeks and states that she will request labs if needed at that time.  I recommended that Kimberly Gay consider taking ground root ginseng 1000 mg a day as this is the recommendation from the [place] of clinical oncology for cancer related fatigue.  I also suggested she continue to be active as she is able.  I reviewed with her that her breast cancer was noninvasive therefore it is incredibly unlikely for her breast cancer to spread anywhere because it did not go outside of the breast duct.  She verbalized understanding of this.  We will see her back in 1 year for continue surveillance.

## 2022-09-06 ENCOUNTER — Other Ambulatory Visit: Payer: Self-pay

## 2022-09-06 MED ORDER — METOPROLOL SUCCINATE ER 50 MG PO TB24: 50.0000 mg | ORAL_TABLET | Freq: Every day | ORAL | 1 refills | Status: DC

## 2022-10-17 DIAGNOSIS — Z961 Presence of intraocular lens: Secondary | ICD-10-CM | POA: Diagnosis not present

## 2022-10-17 DIAGNOSIS — H524 Presbyopia: Secondary | ICD-10-CM | POA: Diagnosis not present

## 2022-10-17 DIAGNOSIS — H04123 Dry eye syndrome of bilateral lacrimal glands: Secondary | ICD-10-CM | POA: Diagnosis not present

## 2022-10-17 DIAGNOSIS — H52203 Unspecified astigmatism, bilateral: Secondary | ICD-10-CM | POA: Diagnosis not present

## 2022-10-17 DIAGNOSIS — H5213 Myopia, bilateral: Secondary | ICD-10-CM | POA: Diagnosis not present

## 2022-11-02 ENCOUNTER — Other Ambulatory Visit: Payer: Self-pay | Admitting: Interventional Radiology

## 2022-11-02 DIAGNOSIS — C641 Malignant neoplasm of right kidney, except renal pelvis: Secondary | ICD-10-CM

## 2022-11-03 ENCOUNTER — Other Ambulatory Visit: Payer: Self-pay | Admitting: Family Medicine

## 2022-11-03 ENCOUNTER — Ambulatory Visit
Admission: RE | Admit: 2022-11-03 | Discharge: 2022-11-03 | Disposition: A | Discharge status: Home / Self Care | Payer: Medicare PPO | Source: Ambulatory Visit | Attending: Family Medicine | Admitting: Family Medicine

## 2022-11-03 DIAGNOSIS — M549 Dorsalgia, unspecified: Secondary | ICD-10-CM

## 2022-11-03 DIAGNOSIS — M47814 Spondylosis without myelopathy or radiculopathy, thoracic region: Secondary | ICD-10-CM | POA: Diagnosis not present

## 2022-11-03 DIAGNOSIS — Z6828 Body mass index (BMI) 28.0-28.9, adult: Secondary | ICD-10-CM | POA: Diagnosis not present

## 2022-11-03 DIAGNOSIS — M47816 Spondylosis without myelopathy or radiculopathy, lumbar region: Secondary | ICD-10-CM | POA: Diagnosis not present

## 2022-11-03 DIAGNOSIS — G8929 Other chronic pain: Secondary | ICD-10-CM | POA: Diagnosis not present

## 2022-11-03 DIAGNOSIS — I700 Atherosclerosis of aorta: Secondary | ICD-10-CM | POA: Diagnosis not present

## 2022-11-03 DIAGNOSIS — M5137 Other intervertebral disc degeneration, lumbosacral region: Secondary | ICD-10-CM | POA: Diagnosis not present

## 2022-11-03 DIAGNOSIS — R0781 Pleurodynia: Secondary | ICD-10-CM | POA: Diagnosis not present

## 2022-11-18 ENCOUNTER — Ambulatory Visit (HOSPITAL_COMMUNITY)
Admission: RE | Admit: 2022-11-18 | Discharge: 2022-11-18 | Disposition: A | Discharge status: Home / Self Care | Payer: Medicare PPO | Source: Ambulatory Visit | Attending: Interventional Radiology | Admitting: Interventional Radiology

## 2022-11-18 DIAGNOSIS — C641 Malignant neoplasm of right kidney, except renal pelvis: Secondary | ICD-10-CM | POA: Diagnosis not present

## 2022-11-18 DIAGNOSIS — N281 Cyst of kidney, acquired: Secondary | ICD-10-CM | POA: Diagnosis not present

## 2022-11-18 MED ORDER — GADOBUTROL 1 MMOL/ML IV SOLN
7.5000 mL | Freq: Once | INTRAVENOUS | Status: AC | PRN
  Administered 2022-11-18: 7.5 mL via INTRAVENOUS

## 2022-11-24 ENCOUNTER — Ambulatory Visit
Admission: RE | Admit: 2022-11-24 | Discharge: 2022-11-24 | Disposition: A | Discharge status: Home / Self Care | Payer: Medicare PPO | Source: Ambulatory Visit | Attending: Interventional Radiology | Admitting: Interventional Radiology

## 2022-11-24 DIAGNOSIS — C641 Malignant neoplasm of right kidney, except renal pelvis: Secondary | ICD-10-CM

## 2022-11-24 HISTORY — PX: IR RADIOLOGIST EVAL & MGMT: IMG5224

## 2022-11-24 NOTE — Progress Notes (Signed)
Chief Complaint: Right renal malignancy s/p cryoablation   Referring Physician(s): Kasandra Knudsen D   History of Present Illness: Kimberly Gay is a 82 y.o. female with remote history of colon cancer s/p resection, presented with RUQ pain in January, 2022.  MRI of abdomen subsequently showed a solid right renal mass.    She underwent cryoablation and biopsy of this mass on 11/18/2020.  The procedure was complicated by rapidly enlarging hematoma which required angiogram and embolization.  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   She had a MRI of the abdomen performed on 02/19/2021 and 05/20/2022 which showed no evidence of recurrent tumor or new renal masses.  Her most recent MRI was performed on 11/17/2021 and still shows no evidence of recurrent or residual renal malignancy.  She has no new renal tumors.   She feels well today and denies any or hematuria.  She has had intermittent right abdominal pain for the past year.  She said the pain comes and goes randomly but does seem to be worse when she is sitting in certain positions for a long time.  She is being worked up by her primary care doctor for the pain and may follow up with a GI physician.  I discussed with her findings of her MRI and told her that I d not believe the pain is related to her right kidney or the renal ablation given its distribution and intermittent frequency.  I encouraged her to follow up with her primary care physician regarding the pain.  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HCC)    ectactic 3.5 cm 05/03/19 CTA chest   Brain aneurysm 04/03/2019   Breast cancer (HCC) 08/20/2021   Cancer (HCC) 1994   colon-resection   Cerebral aneurysm 04/03/2019   Cerebral aneurysm, nonruptured 2010   not treated due to location-monitor yearly   Dyspnea on exertion 04/03/2019   Dysrhythmia    afib    GERD  (gastroesophageal reflux disease)    infrequent   Guaiac + stool 02/26/2020   History of hiatal hernia    Hypertension    well controlled   Incontinence of urine    Joint pain, knee    Palpitations 04/03/2019   Paroxysmal atrial fibrillation (HCC) CHA2DS2-VASc equals 4, not anticoagulated so far because of history of intracranial bleed and aneurysm 10/28/2019   s/p Watchman Device 03/25/20   Personal history of radiation therapy    Precordial chest pain 04/03/2019   Renal mass    s/p right inferior 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    Urgency of urination     Past Surgical History:  Procedure Laterality Date   ABDOMINAL HYSTERECTOMY     ANEURYSM COILING  2010   cerebral coiling-no deficits   BACK SURGERY     BREAST BIOPSY Right 08/20/2021   BREAST EXCISIONAL BIOPSY Left 04/05/1994   BREAST LUMPECTOMY     BREAST LUMPECTOMY WITH RADIOACTIVE SEED LOCALIZATION Right 09/28/2021   Procedure: RIGHT BREAST LUMPECTOMY WITH RADIOACTIVE SEED LOCALIZATION;  Surgeon: Harriette Bouillon, MD;  [facility];  Service: General;  Laterality: Right;   CHOLECYSTECTOMY     CYSTOSCOPY  09/03/2012   Procedure: CYSTOSCOPY;  Surgeon: Kathi Ludwig, MD;  Location: [facility] [place];  Service: Urology;  Laterality: N/A;   FLEXIBLE SIGMOIDOSCOPY N/A 11/24/2014   Procedure: FLEXIBLE SIGMOIDOSCOPY;  Surgeon: Charolett Bumpers, MD;  Location: [facility];  Service: Endoscopy;  Laterality: N/A;   IR EMBO ART  VEN HEMORR LYMPH EXTRAV  INC GUIDE ROADMAPPING  11/18/2020   IR RADIOLOGIST EVAL & MGMT  09/23/2020   IR RADIOLOGIST EVAL & MGMT  12/15/2020   IR RADIOLOGIST EVAL & MGMT  02/23/2021   IR RADIOLOGIST EVAL & MGMT  05/23/2022   IR RENAL SUPRASEL UNI S&I MOD SED  11/18/2020   IR US GUIDE VASC ACCESS RIGHT  11/18/2020   LAMINECTOMY     LEFT ATRIAL APPENDAGE OCCLUSION     device  implanted 03/25/20 at [facility]   LYSIS OF ADHESION  2000's   with cholecystectomy procedure   PILONIDAL CYST EXCISION     PUBOVAGINAL SLING  09/03/2012   Procedure: Leonides Grills;  Surgeon: Kathi Ludwig, MD;  Location: [facility];  Service: Urology;  Laterality: N/A;  LYNX SLING    RADIOLOGY WITH ANESTHESIA Right 11/18/2020   Procedure: IR WITH ANESTHESIA RENAL CRYOABLATION;  Surgeon: Oley Balm, MD;  Location: [facility];  Service: Radiology;  Laterality: Right;    Allergies: Amoxicillin, Meperidine and related, and Wasp venom  Medications: Prior to Admission medications   Medication Sig Start Date End Date Taking? Authorizing Provider  acetaminophen (TYLENOL) 500 MG tablet Take 500-1,000 mg by mouth every 6 (six) hours as needed for mild pain.    [provider]  aspirin EC 81 MG tablet Take 1 tablet (81 mg total) by mouth daily. Swallow whole. 02/14/20   Georgeanna Lea, MD  cetirizine (ZYRTEC) 10 MG tablet Take 10 mg by mouth daily as needed for allergies.    [provider]  cholecalciferol (VITAMIN D) 1000 UNITS tablet Take 2,000 Units by mouth daily.     [provider]  clotrimazole-betamethasone (LOTRISONE) cream Apply topically.    [provider]  EPINEPHrine (EPIPEN) 0.3 mg/0.3 mL SOAJ injection Inject 0.3 mLs (0.3 mg total) into the muscle once. Patient not taking: Reported on 07/11/2022 08/09/13   Marisa Severin, MD  furosemide (LASIX) 20 MG tablet Take 20 mg by mouth daily.  02/02/19   [provider]  hydrocortisone 2.5 % cream Apply 2.5 application topically daily as needed (hemorrhoids). 12/23/19   [provider]  metoprolol succinate (TOPROL-XL) 50 MG 24 hr tablet Take 1 tablet (50 mg total) by mouth daily. Take with or immediately following a meal. 09/06/22 03/05/23  Georgeanna Lea, MD  nitroGLYCERIN (NITROSTAT) 0.4 MG SL tablet Place 1 tablet (0.4 mg total) under the tongue every 5 (five)  minutes as needed for chest pain. Patient not taking: Reported on 07/11/2022 05/04/22 08/02/22  Georgeanna Lea, MD  pantoprazole (PROTONIX) 40 MG tablet Take 40 mg by mouth 2 (two) times daily.    [provider]  phenylephrine-shark liver oil-mineral oil-petrolatum (PREPARATION H) 0.25-3-14-71.9 % rectal ointment Place 1 application rectally 2 (two) times daily as needed for hemorrhoids.    [provider]  Polyethyl Glycol-Propyl Glycol (SYSTANE OP) Place 1 drop into both eyes 2 (two) times daily.    [provider]  PROCTOFOAM [facility] rectal foam 2 (two) times daily as needed. 06/17/22   [provider]  valACYclovir (VALTREX) 1000 MG tablet Take 1,000 mg by mouth. 05/11/22   [provider]     Family History  Problem Relation Age of Onset   Seizures Mother  Heart attack Father    Breast cancer Cousin    Ovarian cancer Cousin    Diabetes Other     Social History   Socioeconomic History   Marital status: Widowed    Spouse name: Not on file   Number of children: Not on file   Years of education: Not on file   Highest education level: Not on file  Occupational History   Not on file  Tobacco Use   Smoking status: Never    Passive exposure: Never   Smokeless tobacco: Never  Vaping Use   Vaping Use: Never used  Substance and Sexual Activity   Alcohol use: No   Drug use: No   Sexual activity: Not on file  Other Topics Concern   Not on file  Social History Narrative   Not on file   Social Determinants of Health   Financial Resource Strain: Not on file  Food Insecurity: Not on file  Transportation Needs: Not on file  Physical Activity: Not on file  Stress: Not on file  Social Connections: Not on file   Review of Systems  Review of Systems: A 12 point ROS discussed and pertinent positives are indicated in the HPI above.  All other systems are negative.  Advance Care Plan: The advanced care plan/surrogate decision maker was  discussed at the time of visit and the patient did not wish to discuss or was not able to name a surrogate decision maker or provide an advance care plan.   Physical Exam No direct physical exam was performed (except for noted visual exam findings with Video Visits).    Vital Signs: There were no vitals taken for this visit.  Imaging: MR ABDOMEN WWO CONTRAST  Result Date: 11/21/2022 CLINICAL DATA:  Follow-up right renal lesion, oncocytoma status post cryoablation EXAM: MRI ABDOMEN WITHOUT AND WITH CONTRAST TECHNIQUE: Multiplanar multisequence MR imaging of the abdomen was performed both before and after the administration of intravenous contrast. CONTRAST:  7.27mL GADAVIST GADOBUTROL 1 MMOL/ML IV SOLN COMPARISON:  05/20/2022 FINDINGS: Lower chest: No acute abnormality. Hepatobiliary: No solid liver abnormality. Small benign liver cysts, for which no further follow-up or characterization is required. Status post cholecystectomy. Unchanged intra and extrahepatic biliary ductal dilatation, common bile duct measuring up to 1.7 cm in caliber. Pancreas: Unremarkable. No pancreatic ductal dilatation or surrounding inflammatory changes. Spleen: Normal in size without significant abnormality. Adrenals/Urinary Tract: Adrenal glands are unremarkable. Unchanged post ablation appearance of the lateral inferior pole of the right kidney (series 11, image 56). No mass or suspicious contrast enhancement. Multiple simple, benign bilateral renal cortical cysts, for which no specific follow-up or characterization is required. No hydronephrosis. Stomach/Bowel: Stomach is within normal limits. No evidence of bowel wall thickening, distention, or inflammatory changes. Vascular/Lymphatic: No significant vascular findings are present. No enlarged abdominal lymph nodes. Other: No abdominal wall hernia or abnormality. No ascites. Musculoskeletal: No acute or significant osseous findings. IMPRESSION: 1. Unchanged post ablation  appearance of the lateral inferior pole of the right kidney. No mass or suspicious contrast enhancement to suggest local recurrence. 2. Unchanged post cholecystectomy biliary ductal dilatation. Electronically Signed   By: Jearld Lesch M.D.   On: 11/21/2022 12:32   DG Lumbar Spine Complete  Result Date: 11/05/2022 CLINICAL DATA:  DORSALGIA EXAM: LUMBAR SPINE - COMPLETE 5 VIEW COMPARISON:  C6-7 2015 FINDINGS: No fracture, dislocation or subluxation. No spondylolisthesis. No osteolytic or osteoblastic changes. Degenerative disc disease noted with disc space narrowing, sclerosis and marginal osteophytes at L4-L5-S1. IMPRESSION:  Worsening degenerative changes. No acute osseous abnormalities. Electronically Signed   By: Layla Maw M.D.   On: 11/05/2022 13:41   DG Thoracic Spine W/Swimmers  Result Date: 11/05/2022 CLINICAL DATA:  Pain EXAM: THORACIC SPINE - 3 VIEWS COMPARISON:  None Available. FINDINGS: Diffuse thoracic degenerative changes identified at each thoracic level with marginal osteophytes and disc space narrowing. No compression deformities or spondylolisthesis identified. No focal osteolytic or osteoblastic changes. IMPRESSION: Degenerative changes.  No acute findings. Electronically Signed   By: Layla Maw M.D.   On: 11/05/2022 09:38   DG Ribs Unilateral Right  Result Date: 11/05/2022 CLINICAL DATA:  posterior right chest/rib pain; DORSALGIA EXAM: CHEST - 2 VIEW; RIGHT RIBS - 2 VIEW COMPARISON:  11/11/2020. FINDINGS: The heart size and mediastinal contours are within normal limits. Both lungs are clear. No pneumothorax or pleural effusion. Aorta is calcified. There are thoracic degenerative changes. No acute osseous abnormalities identified. IMPRESSION: No active cardiopulmonary disease. Thoracic degenerative changes. No acute osseous abnormalities. Electronically Signed   By: Layla Maw M.D.   On: 11/05/2022 09:31   DG Chest 2 View  Result Date: 11/05/2022 CLINICAL DATA:   posterior right chest/rib pain; DORSALGIA EXAM: CHEST - 2 VIEW; RIGHT RIBS - 2 VIEW COMPARISON:  11/11/2020. FINDINGS: The heart size and mediastinal contours are within normal limits. Both lungs are clear. No pneumothorax or pleural effusion. Aorta is calcified. There are thoracic degenerative changes. No acute osseous abnormalities identified. IMPRESSION: No active cardiopulmonary disease. Thoracic degenerative changes. No acute osseous abnormalities. Electronically Signed   By: Layla Maw M.D.   On: 11/05/2022 09:31    Labs:  CBC: No results for input(s): "WBC", "HGB", "HCT", "PLT" in the last 8760 hours.  COAGS: No results for input(s): "INR", "APTT" in the last 8760 hours.  BMP: No results for input(s): "NA", "K", "CL", "CO2", "GLUCOSE", "BUN", "CALCIUM", "CREATININE", "GFRNONAA", "GFRAA" in the last 8760 hours.  Invalid input(s): "CMP"  LIVER FUNCTION TESTS: No results for input(s): "BILITOT", "AST", "ALT", "ALKPHOS", "PROT", "ALBUMIN" in the last 8760 hours.  TUMOR MARKERS: No results for input(s): "AFPTM", "CEA", "CA199", "CHROMGRNA" in the last 8760 hours.  Assessment and Plan:  82 year old woman s/p cryoablation of right renal mass returns to IR clinic for follow up.  Her most recent MRI shows no evidence of reoccurrence.  No new tumors were identified.    She has had intermittent right back pain for the past year.  I discussed with her findings of her MRI and told her that I do not believe the pain is related to her right kidney or the renal ablation given its distribution, intermittent frequency, and appearance on MRI.  I encouraged her to follow up with her primary care physician regarding the pain.   Plan: - MRI abdomen in 12 months - Follow up clinic visit after MRI  Thank you for this interesting consult.  I greatly enjoyed meeting Kimberly Gay and look forward to participating in their care.  A copy of this report was sent to the requesting provider on this  date.  Electronically Signed: Al Corpus Jerzi Tigert 11/24/2022, 12:08 PM   I spent a total of    25 Minutes in remote  clinical consultation, greater than 50% of which was counseling/coordinating care for right renal malignancy.    Visit type: Audio only (telephone). Audio (no video) only due to technical limitations. Alternative for in-person consultation at [facility], [address], [place], [place]. This visit type was conducted  due to national recommendations for restrictions regarding the COVID-19 Pandemic (e.g. social distancing).  This format is felt to be most appropriate for this patient at this time.  All issues noted in this document were discussed and addressed.

## 2023-03-10 ENCOUNTER — Other Ambulatory Visit: Payer: Self-pay | Admitting: Cardiology

## 2023-04-19 ENCOUNTER — Encounter: Payer: Self-pay | Admitting: Cardiology

## 2023-04-19 ENCOUNTER — Ambulatory Visit: Payer: Medicare PPO | Attending: Cardiology | Admitting: Cardiology

## 2023-04-19 VITALS — BP 134/74 | HR 65 | Ht 65.0 in | Wt 172.0 lb

## 2023-04-19 DIAGNOSIS — R0609 Other forms of dyspnea: Secondary | ICD-10-CM | POA: Diagnosis not present

## 2023-04-19 DIAGNOSIS — Z95818 Presence of other cardiac implants and grafts: Secondary | ICD-10-CM | POA: Diagnosis not present

## 2023-04-19 DIAGNOSIS — I10 Essential (primary) hypertension: Secondary | ICD-10-CM

## 2023-04-19 DIAGNOSIS — I480 Paroxysmal atrial fibrillation: Secondary | ICD-10-CM | POA: Diagnosis not present

## 2023-04-19 DIAGNOSIS — K2100 Gastro-esophageal reflux disease with esophagitis, without bleeding: Secondary | ICD-10-CM | POA: Diagnosis not present

## 2023-04-19 NOTE — Patient Instructions (Signed)

## 2023-04-19 NOTE — Progress Notes (Signed)
Cardiology Office Note:    Date:  04/19/2023   ID:  Ulyses Amor, DOB 1941-03-04, MRN [id number]  PCP:  Inez Pilgrim, NP  Cardiologist:  Gypsy Balsam, MD    Referring MD: Inez Pilgrim, NP   Chief Complaint  Patient presents with   Follow-up    History of Present Illness:    Kimberly Gay is a 82 y.o. female medical history significant for paroxysmal atrial fibrillation she is not anticoagulated because of history of brain bleeding secondary to ruptured aneurysm she does have Watchman device done in [facility], essential hypertension, dyslipidemia, colon cancer s/p resection, renal cancer, breast cancer s/p lumpectomy radiation. Comes today to months for follow-up overall doing very well.  Denies of any chest pain tightness squeezing pressure burning chest.  Described to have palpitation after she wakes up in the middle of the night with nightmares.  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HCC)    ectactic 3.5 cm 05/03/19 CTA chest   Brain aneurysm 04/03/2019   Breast cancer (HCC) 08/20/2021   Cancer (HCC) 1994   colon-resection   Cerebral aneurysm 04/03/2019   Cerebral aneurysm, nonruptured 2010   not treated due to location-monitor yearly   Dyspnea on exertion 04/03/2019   Dysrhythmia    afib    GERD (gastroesophageal reflux disease)    infrequent   Guaiac + stool 02/26/2020   History of hiatal hernia    Hypertension    well controlled   Incontinence of urine    Joint pain, knee    Palpitations 04/03/2019   Paroxysmal atrial fibrillation (HCC) CHA2DS2-VASc equals 4, not anticoagulated so far because of history of intracranial bleed and aneurysm 10/28/2019   s/p Watchman Device 03/25/20   Personal history of radiation therapy    Precordial chest pain 04/03/2019   Renal mass    s/p right inferior 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   Stroke [facility])    due to aneurysm rupture  in 2010    Urgency of urination     Past Surgical History:  Procedure Laterality Date   ABDOMINAL HYSTERECTOMY     ANEURYSM COILING  2010   cerebral coiling-no deficits   BACK SURGERY     BREAST BIOPSY Right 08/20/2021   BREAST EXCISIONAL BIOPSY Left 04/05/1994   BREAST LUMPECTOMY     BREAST LUMPECTOMY WITH RADIOACTIVE SEED LOCALIZATION Right 09/28/2021   Procedure: RIGHT BREAST LUMPECTOMY WITH RADIOACTIVE SEED LOCALIZATION;  Surgeon: Harriette Bouillon, MD;  Location: [facility];  Service: General;  Laterality: Right;   CHOLECYSTECTOMY     CYSTOSCOPY  09/03/2012   Procedure: CYSTOSCOPY;  Surgeon: Kathi Ludwig, MD;  Location: [facility];  Service: Urology;  Laterality: N/A;   FLEXIBLE SIGMOIDOSCOPY N/A 11/24/2014   Procedure: FLEXIBLE SIGMOIDOSCOPY;  Surgeon: Charolett Bumpers, MD;  [facility];  Service: Endoscopy;  Laterality: N/A;   IR EMBO ART  VEN HEMORR LYMPH EXTRAV  INC GUIDE ROADMAPPING  11/18/2020   IR RADIOLOGIST EVAL & MGMT  09/23/2020   IR RADIOLOGIST EVAL & MGMT  12/15/2020   IR RADIOLOGIST EVAL & MGMT  02/23/2021   IR RADIOLOGIST EVAL & MGMT  05/23/2022   IR RADIOLOGIST EVAL & MGMT  11/24/2022   IR RENAL SUPRASEL UNI S&I MOD SED  11/18/2020   IR US GUIDE VASC ACCESS RIGHT  11/18/2020   LAMINECTOMY     LEFT ATRIAL APPENDAGE OCCLUSION  device implanted 03/25/20 at [facility]   LYSIS OF ADHESION  2000's   with cholecystectomy procedure   PILONIDAL CYST EXCISION     PUBOVAGINAL SLING  09/03/2012   Procedure: Leonides Grills;  Surgeon: Kathi Ludwig, MD;  Location: [facility];  Service: Urology;  Laterality: N/A;  LYNX SLING    RADIOLOGY WITH ANESTHESIA Right 11/18/2020   Procedure: IR WITH ANESTHESIA RENAL CRYOABLATION;  Surgeon: Oley Balm, MD;  [facility];  Service: Radiology;  Laterality: Right;    Current Medications: Current Meds  Medication Sig   acetaminophen (TYLENOL) 500 MG tablet Take 500-1,000 mg  by mouth every 6 (six) hours as needed for mild pain.   aspirin EC 81 MG tablet Take 1 tablet (81 mg total) by mouth daily. Swallow whole.   cetirizine (ZYRTEC) 10 MG tablet Take 10 mg by mouth daily as needed for allergies.   cholecalciferol (VITAMIN D) 1000 UNITS tablet Take 2,000 Units by mouth daily.    clotrimazole-betamethasone (LOTRISONE) cream Apply 1 Application topically 2 (two) times daily.   EPINEPHrine (EPIPEN) 0.3 mg/0.3 mL SOAJ injection Inject 0.3 mLs (0.3 mg total) into the muscle once.   furosemide (LASIX) 20 MG tablet Take 20 mg by mouth daily.    hydrocortisone 2.5 % cream Apply 2.5 application topically daily as needed (hemorrhoids).   metoprolol succinate (TOPROL-XL) 50 MG 24 hr tablet TAKE ONE TABLET BY MOUTH DAILY. TAKE WITH OR IMMEDIATELY FOLLOWING A MEAL (Patient taking differently: Take 50 mg by mouth daily.)   nitroGLYCERIN (NITROSTAT) 0.4 MG SL tablet Place 1 tablet (0.4 mg total) under the tongue every 5 (five) minutes as needed for chest pain.   phenylephrine-shark liver oil-mineral oil-petrolatum (PREPARATION H) 0.25-3-14-71.9 % rectal ointment Place 1 application rectally 2 (two) times daily as needed for hemorrhoids.   Polyethyl Glycol-Propyl Glycol (SYSTANE OP) Place 1 drop into both eyes 2 (two) times daily.   valACYclovir (VALTREX) 1000 MG tablet Take 1,000 mg by mouth daily.   [DISCONTINUED] pantoprazole (PROTONIX) 40 MG tablet Take 40 mg by mouth 2 (two) times daily.   [DISCONTINUED] PROCTOFOAM HC rectal foam Place 1 applicator rectally 2 (two) times daily as needed for anal itching.     Allergies:   Amoxicillin, Meperidine and related, and Wasp venom   Social History   Socioeconomic History   Marital status: Widowed    Spouse name: Not on file   Number of children: Not on file   Years of education: Not on file   Highest education level: Not on file  Occupational History   Not on file  Tobacco Use   Smoking status: Never    Passive exposure: Never    Smokeless tobacco: Never  Vaping Use   Vaping status: Never Used  Substance and Sexual Activity   Alcohol use: No   Drug use: No   Sexual activity: Not on file  Other Topics Concern   Not on file  Social History Narrative   Not on file   Social Determinants of Health   Financial Resource Strain: Not on file  Food Insecurity: Not on file  Transportation Needs: Not on file  Physical Activity: Not on file  Stress: Not on file  Social Connections: Not on file     Family History: The patient's family history includes Breast cancer in her cousin; Diabetes in an other family member; Heart attack in her father; Ovarian cancer in her cousin; Seizures in her mother. ROS:   Please see the history  of present illness.    All 14 point review of systems negative except as described per history of present illness  EKGs/Labs/Other Studies Reviewed:    EKG Interpretation Date/Time:  Wednesday April 19 2023 14:03:26 EDT Ventricular Rate:  66 PR Interval:  178 QRS Duration:  72 QT Interval:  380 QTC Calculation: 398 R Axis:   -14  Text Interpretation: Normal sinus rhythm Normal ECG When compared with ECG of 11-Nov-2020 11:36, Nonspecific T wave abnormality has replaced inverted T waves in Inferior leads Confirmed by Gypsy Balsam [phone]) on 04/19/2023 2:26:50 PM    Recent Labs: No results found for requested labs within last 365 days.  Recent Lipid Panel No results found for: "CHOL", "TRIG", "HDL", "CHOLHDL", "VLDL", "LDLCALC", "LDLDIRECT"  Physical Exam:    VS:  BP 134/74 (BP Location: Right Arm, Patient Position: Sitting)   Pulse 65   Ht 5\' 5"  (1.651 m)   Wt 172 lb (78 kg)   SpO2 93%   BMI 28.62 kg/m     Wt Readings from Last 3 Encounters:  04/19/23 172 lb (78 kg)  07/11/22 169 lb 6.4 oz (76.8 kg)  03/21/22 172 lb 0.6 oz (78 kg)     GEN:  Well nourished, well developed in no acute distress HEENT: Normal NECK: No JVD; No carotid bruits LYMPHATICS: No  lymphadenopathy CARDIAC: RRR, no murmurs, no rubs, no gallops RESPIRATORY:  Clear to auscultation without rales, wheezing or rhonchi  ABDOMEN: Soft, non-tender, non-distended MUSCULOSKELETAL:  No edema; No deformity  SKIN: Warm and dry LOWER EXTREMITIES: no swelling NEUROLOGIC:  Alert and oriented x 3 PSYCHIATRIC:  Normal affect   ASSESSMENT:    1. Primary hypertension   2. Paroxysmal atrial fibrillation (HCC)   3. Gastroesophageal reflux disease with esophagitis, unspecified whether hemorrhage   4. Dyspnea on exertion   5. Presence of Watchman left atrial appendage closure device    PLAN:    In order of problems listed above:  Essential hypertension blood pressure well-controlled continue present management. Paroxysmal atrial fibrillation she does have some palpitations but does happening when she wakes up in the middle of the night with nightmares I offered her potentially having some investigation she said does not bother her much she does not want to do much about it.  She does have Watchman device. Gastroesophageal reflux disease stable. Dyslipidemia I did review K PN which show me her LDL 120 HDL 64 we will continue present   Medication Adjustments/Labs and Tests Ordered: Current medicines are reviewed at length with the patient today.  Concerns regarding medicines are outlined above.  Orders Placed This Encounter  Procedures   EKG 12-Lead   Medication changes: No orders of the defined types were placed in this encounter.   Signed, Georgeanna Lea, MD, [facility] 04/19/2023 2:39 PM    [facility]

## 2023-05-04 DIAGNOSIS — Z23 Encounter for immunization: Secondary | ICD-10-CM | POA: Diagnosis not present

## 2023-05-04 DIAGNOSIS — I10 Essential (primary) hypertension: Secondary | ICD-10-CM | POA: Diagnosis not present

## 2023-05-04 DIAGNOSIS — Z0000 Encounter for general adult medical examination without abnormal findings: Secondary | ICD-10-CM | POA: Diagnosis not present

## 2023-05-04 DIAGNOSIS — I671 Cerebral aneurysm, nonruptured: Secondary | ICD-10-CM | POA: Diagnosis not present

## 2023-05-04 DIAGNOSIS — R202 Paresthesia of skin: Secondary | ICD-10-CM | POA: Diagnosis not present

## 2023-05-04 DIAGNOSIS — I4891 Unspecified atrial fibrillation: Secondary | ICD-10-CM | POA: Diagnosis not present

## 2023-05-04 DIAGNOSIS — Z136 Encounter for screening for cardiovascular disorders: Secondary | ICD-10-CM | POA: Diagnosis not present

## 2023-05-04 DIAGNOSIS — D6869 Other thrombophilia: Secondary | ICD-10-CM | POA: Diagnosis not present

## 2023-05-04 DIAGNOSIS — K219 Gastro-esophageal reflux disease without esophagitis: Secondary | ICD-10-CM | POA: Diagnosis not present

## 2023-05-04 DIAGNOSIS — N1831 Chronic kidney disease, stage 3a: Secondary | ICD-10-CM | POA: Diagnosis not present

## 2023-06-15 ENCOUNTER — Other Ambulatory Visit: Payer: Self-pay | Admitting: Family Medicine

## 2023-06-15 DIAGNOSIS — M8580 Other specified disorders of bone density and structure, unspecified site: Secondary | ICD-10-CM

## 2023-06-15 DIAGNOSIS — Z1231 Encounter for screening mammogram for malignant neoplasm of breast: Secondary | ICD-10-CM

## 2023-07-04 DIAGNOSIS — N390 Urinary tract infection, site not specified: Secondary | ICD-10-CM | POA: Diagnosis not present

## 2023-07-04 DIAGNOSIS — R6889 Other general symptoms and signs: Secondary | ICD-10-CM | POA: Diagnosis not present

## 2023-07-04 DIAGNOSIS — Z6828 Body mass index (BMI) 28.0-28.9, adult: Secondary | ICD-10-CM | POA: Diagnosis not present

## 2023-07-04 DIAGNOSIS — R5383 Other fatigue: Secondary | ICD-10-CM | POA: Diagnosis not present

## 2023-07-04 DIAGNOSIS — Z9989 Dependence on other enabling machines and devices: Secondary | ICD-10-CM | POA: Diagnosis not present

## 2023-07-12 ENCOUNTER — Inpatient Hospital Stay: Payer: Medicare PPO | Attending: Adult Health | Admitting: Adult Health

## 2023-07-12 ENCOUNTER — Encounter: Payer: Self-pay | Admitting: Adult Health

## 2023-07-12 VITALS — BP 164/73 | HR 61 | Temp 98.0°F | Resp 16 | Wt 172.3 lb

## 2023-07-12 DIAGNOSIS — Z90710 Acquired absence of both cervix and uterus: Secondary | ICD-10-CM | POA: Diagnosis not present

## 2023-07-12 DIAGNOSIS — R109 Unspecified abdominal pain: Secondary | ICD-10-CM | POA: Insufficient documentation

## 2023-07-12 DIAGNOSIS — Z803 Family history of malignant neoplasm of breast: Secondary | ICD-10-CM | POA: Insufficient documentation

## 2023-07-12 DIAGNOSIS — Z86000 Personal history of in-situ neoplasm of breast: Secondary | ICD-10-CM | POA: Insufficient documentation

## 2023-07-12 DIAGNOSIS — M719 Bursopathy, unspecified: Secondary | ICD-10-CM | POA: Diagnosis not present

## 2023-07-12 DIAGNOSIS — D0511 Intraductal carcinoma in situ of right breast: Secondary | ICD-10-CM

## 2023-07-12 DIAGNOSIS — Z8041 Family history of malignant neoplasm of ovary: Secondary | ICD-10-CM | POA: Diagnosis not present

## 2023-07-12 DIAGNOSIS — Z85528 Personal history of other malignant neoplasm of kidney: Secondary | ICD-10-CM | POA: Diagnosis not present

## 2023-07-12 DIAGNOSIS — M810 Age-related osteoporosis without current pathological fracture: Secondary | ICD-10-CM | POA: Diagnosis not present

## 2023-07-12 DIAGNOSIS — Z923 Personal history of irradiation: Secondary | ICD-10-CM | POA: Insufficient documentation

## 2023-07-12 NOTE — Progress Notes (Signed)
[facility] Cancer Follow up:    Inez Pilgrim, NP [address] [place] [address]   DIAGNOSIS:  Cancer Staging  Ductal carcinoma in situ (DCIS) of right breast Staging form: Breast, AJCC 8th Edition - Clinical stage from 09/23/2021: Stage 0 (cTis (DCIS), cN0, cM0, GX, ER+, PR+) - Signed by Serena Croissant, MD on 09/23/2021 Histologic grading system: 3 grade system   SUMMARY OF ONCOLOGIC HISTORY: Oncology History  Ductal carcinoma in situ (DCIS) of right breast  08/20/2021 Initial Diagnosis   Screening mammogram: right breast calcifications. Diagnostic mammogram: Indeterminate 1.2 cm group of retroareolar right breast calcifications. Biopsy: intraductal papilloma with DCIS, ER+(100%)/PR+(20%).    09/23/2021 Cancer Staging   Staging form: Breast, AJCC 8th Edition - Clinical stage from 09/23/2021: Stage 0 (cTis (DCIS), cN0, cM0, GX, ER+, PR+) - Signed by Serena Croissant, MD on 09/23/2021 Histologic grading system: 3 grade system   09/28/2021 Surgery   09/28/21: Right Lumpectomy: IG-HG DCIS Margin Positive We discussed her case in the breast tumor board and decided that she does not need additional surgery.   11/08/2021 - 12/06/2021 Radiation Therapy   11/08/2021 through 12/06/2021 Site Technique Total Dose (Gy) Dose per Fx (Gy) Completed Fx Beam Energies  Breast, Right: Breast_R 3D 42.56/42.56 2.66 16/16 6XFFF  Breast, Right: Breast_R_Bst specialPort 10/10 2.5 4/4 12E, 15E       CURRENT THERAPY: observation  INTERVAL HISTORY:  Discussed the use of AI scribe software for clinical note transcription with the patient, who gave verbal consent to proceed.  KAYLIN STRODTMAN 82 y.o. female, here for her breast cancer f/u appointment describes her last year as being in 'bad shape.' She endorses abdominal/back pain is localized to the side and has been a subject of multiple medical consultations, but the cause remains unclear. The patient is seeing her PCP about this issue who  speculates that the pain could be related to their digestive system, back, hemorrhoids, or a previous gallbladder removal.  She has undergone a series of xrays on her spine along with MRI of her abdomen.   The patient has a history of kidney cancer, which was treated with a biopsy and cryoablation. They have undergone several MRIs, including one of the abdomen, which did not reveal any issues. However, they report that one MRI showed inflammation in the area where their gallbladder was removed, suggesting a possible issue with food transit.  The patient also has osteoporosis and is awaiting a bone density test. They suspect that their lower back pain and degeneration may be contributing to their abdominal discomfort. They also report occasional discomfort in their breasts, but it is unclear whether this is related to their back pain or a separate issue.  In an attempt to alleviate their symptoms, the patient has made dietary modifications, including eliminating dairy products to test for lactose intolerance. They are also considering a gluten-free diet. They report a love for cheese, which they used to consume frequently, and they have slightly elevated calcium levels.  The patient also mentions having bursitis, which occasionally flares up in their hip. They have braces for their knee and ankle due to a fall a few years ago.  The patient is currently under the care of multiple healthcare providers, including a cardiologist, although they do not report any new or ongoing cardiac issues. They are scheduled for a mammogram in the near future.  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   Decreased estrogen level 12/30/2020   Dilated cbd, acquired 12/30/2020   Disorder of kidney  and ureter, unspecified 12/30/2020   Dysphagia 12/30/2020   External hemorrhoids 12/30/2020   Osteopenia 12/30/2020   Hypercalcemia 12/30/2020   History of malignant neoplasm of colon 12/30/2020   Vitamin D deficiency 12/30/2020   Paresthesia 12/30/2020   Renal mass, right 11/18/2020   Presence of Watchman left atrial appendage closure device 06/29/2020   Urgency of urination    Joint pain, knee    Incontinence of urine    Hypertension    Gastritis    Paroxysmal atrial fibrillation (HCC) CHA2DS2-VASc equals 4, not anticoagulated so far because of history of intracranial bleed and aneurysm 02/26/2020   Atrial fibrillation (HCC) 10/28/2019   Palpitations 04/03/2019   Dyspnea on exertion 04/03/2019   Precordial chest pain 04/03/2019   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HCC)    ectactic 3.5 cm 05/03/19 CTA chest   Brain aneurysm 04/03/2019   Breast cancer (HCC) 08/20/2021   Cancer (HCC) 1994   colon-resection   Cerebral aneurysm 04/03/2019   Cerebral aneurysm, nonruptured 2010   not treated due to location-monitor yearly   Dyspnea on exertion 04/03/2019   Dysrhythmia    afib    GERD (gastroesophageal reflux disease)    infrequent   Guaiac + stool 02/26/2020   History of hiatal hernia    Hypertension    well controlled   Incontinence of urine    Joint pain, knee    Palpitations 04/03/2019   Paroxysmal atrial fibrillation (HCC) CHA2DS2-VASc equals 4, not anticoagulated so far because of history of intracranial bleed and aneurysm 10/28/2019   s/p Watchman Device 03/25/20   Personal history of radiation therapy    Precordial chest pain 04/03/2019   Renal mass    s/p right inferior segmental renal artery gelfoam embolization 11/18/20 for right renal low grade oncocytic  tumor   Stroke [facility])    due to aneurysm rupture in 2010    Urgency of urination     SURGICAL HISTORY: Past Surgical History:  Procedure Laterality Date   ABDOMINAL HYSTERECTOMY     ANEURYSM COILING  2010   cerebral coiling-no deficits   BACK SURGERY     BREAST BIOPSY Right 08/20/2021   BREAST EXCISIONAL BIOPSY Left 04/05/1994   BREAST LUMPECTOMY     BREAST LUMPECTOMY WITH RADIOACTIVE SEED LOCALIZATION Right 09/28/2021   Procedure: RIGHT BREAST LUMPECTOMY WITH RADIOACTIVE SEED LOCALIZATION;  Surgeon: Harriette Bouillon, MD;  Location: [facility] OR;  Service: General;  Laterality: Right;   CHOLECYSTECTOMY     CYSTOSCOPY  09/03/2012   Procedure: CYSTOSCOPY;  Surgeon: Kathi Ludwig, MD;  Location: [facility];  Service: Urology;  Laterality: N/A;   FLEXIBLE SIGMOIDOSCOPY N/A 11/24/2014   Procedure: FLEXIBLE SIGMOIDOSCOPY;  Surgeon: Charolett Bumpers, MD;  Location: [facility];  Service: Endoscopy;  Laterality: N/A;   IR EMBO ART  VEN HEMORR LYMPH EXTRAV  INC GUIDE ROADMAPPING  11/18/2020   IR RADIOLOGIST EVAL & MGMT  09/23/2020   IR RADIOLOGIST EVAL & MGMT  12/15/2020   IR RADIOLOGIST EVAL & MGMT  02/23/2021   IR RADIOLOGIST  EVAL & MGMT  05/23/2022   IR RADIOLOGIST EVAL & MGMT  11/24/2022   IR RENAL SUPRASEL UNI S&I MOD SED  11/18/2020   IR US GUIDE VASC ACCESS RIGHT  11/18/2020   LAMINECTOMY     LEFT ATRIAL APPENDAGE OCCLUSION     device implanted 03/25/20 at [facility]   LYSIS OF ADHESION  2000's   with cholecystectomy procedure   PILONIDAL CYST EXCISION     PUBOVAGINAL SLING  09/03/2012   Procedure: Leonides Grills;  Surgeon: Kathi Ludwig, MD;  Location: [facility];  Service: Urology;  Laterality: N/A;  LYNX SLING    RADIOLOGY WITH ANESTHESIA Right 11/18/2020   Procedure: IR WITH ANESTHESIA RENAL CRYOABLATION;  Surgeon: Oley Balm, MD;  Location: [facility];  Service: Radiology;  Laterality: Right;    SOCIAL HISTORY: Social History    Socioeconomic History   Marital status: Widowed    Spouse name: Not on file   Number of children: Not on file   Years of education: Not on file   Highest education level: Not on file  Occupational History   Not on file  Tobacco Use   Smoking status: Never    Passive exposure: Never   Smokeless tobacco: Never  Vaping Use   Vaping status: Never Used  Substance and Sexual Activity   Alcohol use: No   Drug use: No   Sexual activity: Not on file  Other Topics Concern   Not on file  Social History Narrative   Not on file   Social Determinants of Health   Financial Resource Strain: Not on file  Food Insecurity: Not on file  Transportation Needs: Not on file  Physical Activity: Not on file  Stress: Not on file  Social Connections: Not on file  Intimate Partner Violence: Not At Risk (10/28/2021)   Humiliation, Afraid, Rape, and Kick questionnaire    Fear of Current or Ex-Partner: No    Emotionally Abused: No    Physically Abused: No    Sexually Abused: No    FAMILY HISTORY: Family History  Problem Relation Age of Onset   Seizures Mother    Heart attack Father    Breast cancer Cousin    Ovarian cancer Cousin    Diabetes Other     Review of Systems  Constitutional:  Negative for appetite change, chills, fatigue, fever and unexpected weight change.  HENT:   Negative for hearing loss, lump/mass and trouble swallowing.   Eyes:  Negative for eye problems and icterus.  Respiratory:  Negative for chest tightness, cough and shortness of breath.   Cardiovascular:  Negative for chest pain, leg swelling and palpitations.  Gastrointestinal:  Positive for abdominal pain (per interval history). Negative for abdominal distention, constipation, diarrhea, nausea and vomiting.  Endocrine: Negative for hot flashes.  Genitourinary:  Negative for difficulty urinating.   Musculoskeletal:  Positive for back pain (per interval history). Negative for arthralgias.  Skin:  Negative for  itching and rash.  Neurological:  Negative for dizziness, extremity weakness, headaches and numbness.  Hematological:  Negative for adenopathy. Does not bruise/bleed easily.  Psychiatric/Behavioral:  Negative for depression. The patient is not nervous/anxious.       PHYSICAL EXAMINATION   Onc Performance Status - 07/12/23 1100       ECOG Perf Status   ECOG Perf Status Restricted in physically strenuous activity but ambulatory and able to carry out work of a light or sedentary nature, e.g., light house work, office work  KPS SCALE   KPS % SCORE Normal activity with effort, some s/s of disease             Vitals:   07/12/23 1051  BP: (!) 164/73  Pulse: 61  Resp: 16  Temp: 98 F (36.7 C)  SpO2: 99%    Physical Exam Constitutional:      General: She is not in acute distress.    Appearance: Normal appearance. She is not toxic-appearing.  HENT:     Head: Normocephalic and atraumatic.     Mouth/Throat:     Mouth: Mucous membranes are moist.     Pharynx: Oropharynx is clear. No oropharyngeal exudate or posterior oropharyngeal erythema.  Eyes:     General: No scleral icterus. Cardiovascular:     Rate and Rhythm: Normal rate and regular rhythm.     Pulses: Normal pulses.     Heart sounds: Normal heart sounds.  Pulmonary:     Effort: Pulmonary effort is normal.     Breath sounds: Normal breath sounds.  Chest:     Comments: Right breast s/p lumpectomy and radiation, no sign of local recurrence, left breast benign Abdominal:     General: Abdomen is flat. Bowel sounds are normal. There is no distension.     Palpations: Abdomen is soft.     Tenderness: There is no abdominal tenderness.  Musculoskeletal:        General: No swelling.     Cervical back: Neck supple.  Lymphadenopathy:     Cervical: No cervical adenopathy.     Upper Body:     Right upper body: No axillary adenopathy.     Left upper body: No axillary adenopathy.  Skin:    General: Skin is warm and  dry.     Findings: No rash.  Neurological:     General: No focal deficit present.     Mental Status: She is alert.  Psychiatric:        Mood and Affect: Mood normal.        Behavior: Behavior normal.       ASSESSMENT and THERAPY PLAN:   Ductal carcinoma in situ (DCIS) of right breast Jakalya is an 82 year old woman with history of ductal carcinoma in situ diagnosed in January 2023 status post right lumpectomy and adjuvant radiation that she completed in May 2023.  History of right breast cancer No sign of local recurrence.  Mammogram scheduled in December.  -Proceed with annual mammogram in 07/2023 as scheduled. -Follow up in 1 year for f/u or sooner if needed.    Abdominal Pain Unclear etiology, possibly related to digestive system, back pain, or post-cholecystectomy syndrome. Recent MRI of abdomen was unremarkable. Patient is currently trialing dietary modifications including elimination of dairy and gluten to assess for possible food intolerances. -Continue dietary modifications and monitor for changes in symptoms. -Continue f/u with PCP   Osteoporosis Patient reports lower back pain and rib pain, possibly related to osteoporosis. Bone density scan scheduled for June. -Continue current management and proceed with scheduled bone density scan.  Bursitis Patient reports occasional flare-ups of bursitis in the hip, managed with exercises and occasional use of a knee brace. -Continue current management and exercises. -Consider referral to ortho if persists   All questions were answered. The patient knows to call the clinic with any problems, questions or concerns. We can certainly see the patient much sooner if necessary.  Total encounter time:20 minutes*in face-to-face visit time, chart review, lab review, care coordination, order entry, and  documentation of the encounter time.  Lillard Anes, NP 07/12/23 3:11 PM Medical Oncology and Hematology [facility] [address] [place], [place] [address] Tel. [phone]    Fax. [phone]  *Total Encounter Time as defined by the Centers for Medicare and Medicaid Services includes, in addition to the face-to-face time of a patient visit (documented in the note above) non-face-to-face time: obtaining and reviewing outside history, ordering and reviewing medications, tests or procedures, care coordination (communications with other health care professionals or caregivers) and documentation in the medical record.

## 2023-07-12 NOTE — Assessment & Plan Note (Addendum)
Kimberly Gay is an 82 year old woman with history of ductal carcinoma in situ diagnosed in January 2023 status post right lumpectomy and adjuvant radiation that she completed in May 2023.  History of right breast cancer No sign of local recurrence.  Mammogram scheduled in December.  -Proceed with annual mammogram in 07/2023 as scheduled. -Follow up in 1 year for f/u or sooner if needed.    Abdominal Pain Unclear etiology, possibly related to digestive system, back pain, or post-cholecystectomy syndrome. Recent MRI of abdomen was unremarkable. Patient is currently trialing dietary modifications including elimination of dairy and gluten to assess for possible food intolerances. -Continue dietary modifications and monitor for changes in symptoms. -Continue f/u with PCP   Osteoporosis Patient reports lower back pain and rib pain, possibly related to osteoporosis. Bone density scan scheduled for June. -Continue current management and proceed with scheduled bone density scan.  Bursitis Patient reports occasional flare-ups of bursitis in the hip, managed with exercises and occasional use of a knee brace. -Continue current management and exercises. -Consider referral to ortho if persists

## 2023-07-13 ENCOUNTER — Other Ambulatory Visit: Payer: Self-pay | Admitting: Family Medicine

## 2023-07-13 DIAGNOSIS — R928 Other abnormal and inconclusive findings on diagnostic imaging of breast: Secondary | ICD-10-CM

## 2023-07-17 ENCOUNTER — Ambulatory Visit
Admission: RE | Admit: 2023-07-17 | Discharge: 2023-07-17 | Disposition: A | Discharge status: Home / Self Care | Payer: Medicare PPO | Source: Ambulatory Visit | Attending: Family Medicine | Admitting: Family Medicine

## 2023-07-17 DIAGNOSIS — Z9889 Other specified postprocedural states: Secondary | ICD-10-CM | POA: Diagnosis not present

## 2023-07-17 DIAGNOSIS — R928 Other abnormal and inconclusive findings on diagnostic imaging of breast: Secondary | ICD-10-CM

## 2023-07-17 DIAGNOSIS — N6310 Unspecified lump in the right breast, unspecified quadrant: Secondary | ICD-10-CM | POA: Diagnosis not present

## 2023-07-20 ENCOUNTER — Ambulatory Visit: Payer: Medicare PPO

## 2023-10-01 ENCOUNTER — Other Ambulatory Visit: Payer: Self-pay | Admitting: Cardiology

## 2023-11-06 DIAGNOSIS — I10 Essential (primary) hypertension: Secondary | ICD-10-CM | POA: Diagnosis not present

## 2023-11-06 DIAGNOSIS — Z6828 Body mass index (BMI) 28.0-28.9, adult: Secondary | ICD-10-CM | POA: Diagnosis not present

## 2023-11-06 DIAGNOSIS — E559 Vitamin D deficiency, unspecified: Secondary | ICD-10-CM | POA: Diagnosis not present

## 2023-11-06 DIAGNOSIS — M8580 Other specified disorders of bone density and structure, unspecified site: Secondary | ICD-10-CM | POA: Diagnosis not present

## 2023-11-06 DIAGNOSIS — N1831 Chronic kidney disease, stage 3a: Secondary | ICD-10-CM | POA: Diagnosis not present

## 2023-11-06 DIAGNOSIS — M479 Spondylosis, unspecified: Secondary | ICD-10-CM | POA: Diagnosis not present

## 2023-11-06 DIAGNOSIS — I4891 Unspecified atrial fibrillation: Secondary | ICD-10-CM | POA: Diagnosis not present

## 2023-11-06 DIAGNOSIS — K219 Gastro-esophageal reflux disease without esophagitis: Secondary | ICD-10-CM | POA: Diagnosis not present

## 2023-11-06 DIAGNOSIS — Z85038 Personal history of other malignant neoplasm of large intestine: Secondary | ICD-10-CM | POA: Diagnosis not present

## 2023-11-09 ENCOUNTER — Encounter: Payer: Self-pay | Admitting: Neurology

## 2023-11-23 DIAGNOSIS — Z85038 Personal history of other malignant neoplasm of large intestine: Secondary | ICD-10-CM | POA: Diagnosis not present

## 2023-11-23 DIAGNOSIS — R140 Abdominal distension (gaseous): Secondary | ICD-10-CM | POA: Diagnosis not present

## 2023-11-23 DIAGNOSIS — R141 Gas pain: Secondary | ICD-10-CM | POA: Diagnosis not present

## 2023-11-29 DIAGNOSIS — M546 Pain in thoracic spine: Secondary | ICD-10-CM | POA: Diagnosis not present

## 2023-12-01 ENCOUNTER — Other Ambulatory Visit: Payer: Self-pay | Admitting: Gastroenterology

## 2023-12-01 DIAGNOSIS — R109 Unspecified abdominal pain: Secondary | ICD-10-CM

## 2023-12-04 ENCOUNTER — Ambulatory Visit: Admitting: Cardiology

## 2023-12-04 DIAGNOSIS — M546 Pain in thoracic spine: Secondary | ICD-10-CM | POA: Diagnosis not present

## 2023-12-05 ENCOUNTER — Encounter: Payer: Self-pay | Admitting: Gastroenterology

## 2023-12-06 DIAGNOSIS — M546 Pain in thoracic spine: Secondary | ICD-10-CM | POA: Diagnosis not present

## 2023-12-08 ENCOUNTER — Ambulatory Visit
Admission: RE | Admit: 2023-12-08 | Discharge: 2023-12-08 | Disposition: A | Discharge status: Home / Self Care | Source: Ambulatory Visit | Attending: Gastroenterology | Admitting: Gastroenterology

## 2023-12-08 DIAGNOSIS — R109 Unspecified abdominal pain: Secondary | ICD-10-CM | POA: Diagnosis not present

## 2023-12-08 MED ORDER — IOPAMIDOL (ISOVUE-300) INJECTION 61%
100.0000 mL | Freq: Once | INTRAVENOUS | Status: AC | PRN
  Administered 2023-12-08: 100 mL via INTRAVENOUS

## 2023-12-09 DIAGNOSIS — M79675 Pain in left toe(s): Secondary | ICD-10-CM | POA: Diagnosis not present

## 2023-12-09 DIAGNOSIS — S9032XA Contusion of left foot, initial encounter: Secondary | ICD-10-CM | POA: Diagnosis not present

## 2023-12-09 DIAGNOSIS — M79672 Pain in left foot: Secondary | ICD-10-CM | POA: Diagnosis not present

## 2023-12-09 DIAGNOSIS — S90122A Contusion of left lesser toe(s) without damage to nail, initial encounter: Secondary | ICD-10-CM | POA: Diagnosis not present

## 2023-12-11 DIAGNOSIS — M546 Pain in thoracic spine: Secondary | ICD-10-CM | POA: Diagnosis not present

## 2023-12-13 DIAGNOSIS — M546 Pain in thoracic spine: Secondary | ICD-10-CM | POA: Diagnosis not present

## 2023-12-18 ENCOUNTER — Encounter: Payer: Self-pay | Admitting: Cardiology

## 2023-12-18 ENCOUNTER — Ambulatory Visit: Attending: Cardiology | Admitting: Cardiology

## 2023-12-18 ENCOUNTER — Ambulatory Visit: Attending: Cardiology

## 2023-12-18 VITALS — BP 128/80 | HR 58 | Ht 65.0 in | Wt 173.0 lb

## 2023-12-18 DIAGNOSIS — Z95818 Presence of other cardiac implants and grafts: Secondary | ICD-10-CM

## 2023-12-18 DIAGNOSIS — I480 Paroxysmal atrial fibrillation: Secondary | ICD-10-CM | POA: Diagnosis not present

## 2023-12-18 DIAGNOSIS — K2100 Gastro-esophageal reflux disease with esophagitis, without bleeding: Secondary | ICD-10-CM

## 2023-12-18 DIAGNOSIS — I10 Essential (primary) hypertension: Secondary | ICD-10-CM

## 2023-12-18 NOTE — Patient Instructions (Signed)
 Medication Instructions:  Your physician recommends that you continue on your current medications as directed. Please refer to the Current Medication list given to you today.  *If you need a refill on your cardiac medications before your next appointment, please call your pharmacy*  Lab Work: None If you have labs (blood work) drawn today and your tests are completely normal, you will receive your results only by: MyChart Message (if you have MyChart) OR A paper copy in the mail If you have any lab test that is abnormal or we need to change your treatment, we will call you to review the results.  Testing/Procedures: A zio monitor was ordered today. It will remain on for 14 days. You will then return monitor and event diary in provided box. It takes 1-2 weeks for report to be downloaded and returned to us . We will call you with the results. If monitor falls off or has orange flashing light, please call Zio for further instructions.   Follow-Up: At [facility], you and your health needs are our priority.  As part of our continuing mission to provide you with exceptional heart care, our providers are all part of one team.  This team includes your primary Cardiologist (physician) and Advanced Practice Providers or APPs (Physician Assistants and Nurse Practitioners) who all work together to provide you with the care you need, when you need it.  Your next appointment:   3 month(s)  Provider:   Ralene Burger, MD    We recommend signing up for the patient portal called "MyChart".  Sign up information is provided on this After Visit Summary.  MyChart is used to connect with patients for Virtual Visits (Telemedicine).  Patients are able to view lab/test results, encounter notes, upcoming appointments, etc.  Non-urgent messages can be sent to your provider as well.   To learn more about what you can do with MyChart, go to [email or web address].   Other Instructions None

## 2023-12-18 NOTE — Progress Notes (Unsigned)
 Cardiology Office Note:    Date:  12/18/2023   ID:  Kimberly Gay, DOB 08/16/1940, MRN [id number]  PCP:  Pridgen, Taylar, NP  Cardiologist:  Ralene Burger, MD    Referring MD: Pridgen, Taylar, NP   Chief Complaint  Patient presents with   BP fluctuation   Dizziness    History of Present Illness:    Kimberly Gay is a 83 y.o. female past medical history significant for paroxysmal atrial fibrillation she is not anticoagulant because of history of intracranial bleed secondary to ruptured aneurysm but she does have Watchman device, essential hypertension, dyslipidemia, colon cancer s/p resection, renal cancer, breast cancer status postlumpectomy and radiation.  Comes today to my office for follow-up overall she says she is doing fair.  In the morning many times she gets palpitations which bothers her.  I also noticed some high fluctuation of her blood pressure.  Otherwise she is trying to do things she does have some chronic back problem which slows her down  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HCC)    ectactic 3.5 cm 05/03/19 CTA chest   Brain aneurysm 04/03/2019   Breast cancer (HCC) 08/20/2021   Cancer (HCC) 1994   colon-resection   Cerebral aneurysm 04/03/2019   Cerebral aneurysm, nonruptured 2010   not treated due to location-monitor yearly   Dyspnea on exertion 04/03/2019   Dysrhythmia    afib    GERD (gastroesophageal reflux disease)    infrequent   Guaiac + stool 02/26/2020   History of hiatal hernia    Hypertension    well controlled   Incontinence of urine    Joint pain, knee    Palpitations 04/03/2019   Paroxysmal atrial fibrillation (HCC) CHA2DS2-VASc equals 4, not anticoagulated so far because of history of intracranial bleed and aneurysm 10/28/2019   s/p Watchman Device 03/25/20   Personal history of radiation therapy    Precordial chest pain 04/03/2019   Renal mass    s/p right inferior segmental renal artery gelfoam  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    Urgency of urination     Past Surgical History:  Procedure Laterality Date   ABDOMINAL HYSTERECTOMY     ANEURYSM COILING  2010   cerebral coiling-no deficits   BACK SURGERY     BREAST BIOPSY Right 08/20/2021   BREAST EXCISIONAL BIOPSY Left 04/05/1994   BREAST LUMPECTOMY     BREAST LUMPECTOMY WITH RADIOACTIVE SEED LOCALIZATION Right 09/28/2021   Procedure: RIGHT BREAST LUMPECTOMY WITH RADIOACTIVE SEED LOCALIZATION;  Surgeon: Sim Dryer, MD;  [facility];  Service: General;  Laterality: Right;   CHOLECYSTECTOMY     CYSTOSCOPY  09/03/2012   Procedure: CYSTOSCOPY;  Surgeon: Edmund Gouge, MD;  Location: [facility];  Service: Urology;  Laterality: N/A;   FLEXIBLE SIGMOIDOSCOPY N/A 11/24/2014   Procedure: FLEXIBLE SIGMOIDOSCOPY;  Surgeon: Garrett Kallman, MD;  Location: [facility];  Service: Endoscopy;  Laterality: N/A;   IR EMBO ART  VEN HEMORR LYMPH EXTRAV  INC GUIDE ROADMAPPING  11/18/2020   IR RADIOLOGIST EVAL & MGMT  09/23/2020   IR RADIOLOGIST EVAL & MGMT  12/15/2020   IR RADIOLOGIST EVAL & MGMT  02/23/2021   IR RADIOLOGIST EVAL & MGMT  05/23/2022   IR RADIOLOGIST EVAL & MGMT  11/24/2022   IR RENAL SUPRASEL UNI S&I MOD SED  11/18/2020  IR US  GUIDE VASC ACCESS RIGHT  11/18/2020   LAMINECTOMY     LEFT ATRIAL APPENDAGE OCCLUSION     device implanted 03/25/20 at [facility]   LYSIS OF ADHESION  2000's   with cholecystectomy procedure   PILONIDAL CYST EXCISION     PUBOVAGINAL SLING  09/03/2012   Procedure: Gino Lais;  Surgeon: Edmund Gouge, MD;  Location[facility];  Service: Urology;  Laterality: N/A;  LYNX SLING    RADIOLOGY WITH ANESTHESIA Right 11/18/2020   Procedure: IR WITH ANESTHESIA RENAL CRYOABLATION;  Surgeon: Marland Silvas, MD;  Location: [facility];  Service: Radiology;  Laterality: Right;     Current Medications: Current Meds  Medication Sig   acetaminophen  (TYLENOL ) 500 MG tablet Take 500-1,000 mg by mouth every 6 (six) hours as needed for mild pain.   aspirin  EC 81 MG tablet Take 1 tablet (81 mg total) by mouth daily. Swallow whole.   cetirizine (ZYRTEC) 10 MG tablet Take 10 mg by mouth daily as needed for allergies.   cholecalciferol (VITAMIN D) 1000 UNITS tablet Take 2,000 Units by mouth daily.    clotrimazole-betamethasone (LOTRISONE) cream Apply 1 Application topically 2 (two) times daily.   EPINEPHrine  (EPIPEN ) 0.3 mg/0.3 mL SOAJ injection Inject 0.3 mLs (0.3 mg total) into the muscle once.   furosemide (LASIX) 20 MG tablet Take 20 mg by mouth daily.    hydrocortisone 2.5 % cream Apply 2.5 application topically daily as needed (hemorrhoids).   metoprolol  succinate (TOPROL -XL) 50 MG 24 hr tablet Take 1 tablet (50 mg total) by mouth daily.   nitroGLYCERIN  (NITROSTAT ) 0.4 MG SL tablet Place 1 tablet (0.4 mg total) under the tongue every 5 (five) minutes as needed for chest pain.   phenylephrine -shark liver oil-mineral oil-petrolatum (PREPARATION H) 0.25-3-14-71.9 % rectal ointment Place 1 application rectally 2 (two) times daily as needed for hemorrhoids.   Polyethyl Glycol-Propyl Glycol (SYSTANE OP) Place 1 drop into both eyes 2 (two) times daily.   valACYclovir (VALTREX) 1000 MG tablet Take 1,000 mg by mouth daily.     Allergies:   Amoxicillin, Meperidine  and related, and Wasp venom   Social History   Socioeconomic History   Marital status: Widowed    Spouse name: Not on file   Number of children: Not on file   Years of education: Not on file   Highest education level: Not on file  Occupational History   Not on file  Tobacco Use   Smoking status: Never    Passive exposure: Never   Smokeless tobacco: Never  Vaping Use   Vaping status: Never Used  Substance and Sexual Activity   Alcohol  use: No   Drug use: No   Sexual activity: Not on file  Other Topics  Concern   Not on file  Social History Narrative   Not on file   Social Drivers of Health   Financial Resource Strain: Not on file  Food Insecurity: Not on file  Transportation Needs: Not on file  Physical Activity: Not on file  Stress: Not on file  Social Connections: Not on file     Family History: The patient's family history includes Breast cancer in her cousin; Diabetes in an other family member; Heart attack in her father; Ovarian cancer in her cousin; Seizures in her mother. ROS:   Please see the history of present illness.    All 14 point review of systems negative except as described per history of present illness  EKGs/Labs/Other Studies Reviewed:  Recent Labs: No results found for requested labs within last 365 days.  Recent Lipid Panel No results found for: "CHOL", "TRIG", "HDL", "CHOLHDL", "VLDL", "LDLCALC", "LDLDIRECT"  Physical Exam:    VS:  BP 128/80 (BP Location: Left Arm, Patient Position: Sitting)   Pulse (!) 58   Ht 5\' 5"  (1.651 m)   Wt 173 lb (78.5 kg)   SpO2 96%   BMI 28.79 kg/m     Wt Readings from Last 3 Encounters:  12/18/23 173 lb (78.5 kg)  07/12/23 172 lb 4.8 oz (78.2 kg)  04/19/23 172 lb (78 kg)     GEN:  Well nourished, well developed in no acute distress HEENT: Normal NECK: No JVD; No carotid bruits LYMPHATICS: No lymphadenopathy CARDIAC: RRR, no murmurs, no rubs, no gallops RESPIRATORY:  Clear to auscultation without rales, wheezing or rhonchi  ABDOMEN: Soft, non-tender, non-distended MUSCULOSKELETAL:  No edema; No deformity  SKIN: Warm and dry LOWER EXTREMITIES: no swelling NEUROLOGIC:  Alert and oriented x 3 PSYCHIATRIC:  Normal affect   ASSESSMENT:    1. Paroxysmal atrial fibrillation (HCC)   2. Primary hypertension   3. Gastroesophageal reflux disease with esophagitis, unspecified whether hemorrhage   4. Presence of Watchman left atrial appendage closure device    PLAN:    In order of problems listed  above:  Paroxysmal atrial fibrillation doing well from that point review but described to have some palpitation in the morning we will put another monitor on to see how often she got some piece of atrial fibrillation if that is the case then we will consider anymore on managing this more aggressively.  Not anticoagulated but does have a Watchman device. Essential hypertension blood pressure excellent today but she show me numbers from home and there is some fluctuation of her blood pressure. Gastroesophageal reflux disease.  Stable. Watchman device.  Noted   Medication Adjustments/Labs and Tests Ordered: Current medicines are reviewed at length with the patient today.  Concerns regarding medicines are outlined above.  No orders of the defined types were placed in this encounter.  Medication changes: No orders of the defined types were placed in this encounter.   Signed, Manfred Seed, MD, [facility] 12/18/2023 11:58 AM    [facility]

## 2023-12-20 ENCOUNTER — Ambulatory Visit: Admitting: Neurology

## 2023-12-20 ENCOUNTER — Encounter: Payer: Self-pay | Admitting: Neurology

## 2023-12-20 VITALS — BP 104/68 | HR 70 | Ht 65.0 in | Wt 170.0 lb

## 2023-12-20 DIAGNOSIS — R202 Paresthesia of skin: Secondary | ICD-10-CM | POA: Diagnosis not present

## 2023-12-20 DIAGNOSIS — R200 Anesthesia of skin: Secondary | ICD-10-CM | POA: Diagnosis not present

## 2023-12-20 DIAGNOSIS — M546 Pain in thoracic spine: Secondary | ICD-10-CM | POA: Diagnosis not present

## 2023-12-20 NOTE — Progress Notes (Signed)
 [facility] Clinic Note - Initial Visit   Date: 12/20/2023   BLAKELYNN SEKELSKY MRN: [id number] DOB: 1941/02/05   Dear Rana Burr, NP:  Thank you for your kind referral of TABIATHA TIMBS for consultation of bilateral feet pain and numbnes. Although her history is well known to you, please allow us  to reiterate it for the purpose of our medical record. The patient was accompanied to the clinic by self.   NATASSHA COKER is a 83 y.o. righ-handed female with PAF, hypertension, history of breast cancer, history of colon cancer, CKD, history of ruptured cerebral aneurysm s/p coiling presenting for evaluation of bilateral feet numbness and pain.   IMPRESSION/PLAN: Bilateral feet numbness and pain.  Prior NCS/EMG from 2022 was normal, without evidence of large fiber neuropathy.  I will plan to repeat NCS/EMG bilateral legs to look for interval change.  With her symptoms affecting the left foot more, lumbosacral radiculopathy is also possible.  She had back pain and is currently doing PT.  If not improvement with PT, the next step is MRI lumbar spine wo contrast.   Return to clinic in 2 months  ------------------------------------------------------------- History of present illness: Starting in 2022, she began having cold sensation in both feet.  She also has numbness and tingling in the feet, worse in the left.  Symptoms are present at all times but can intensify without any specific triggers.  Prolonged walking can aggravate her feet.  She also complains of mid-back pain and is doing PT.  No history of diabetes.  She had chemotherapy for colon cancer 30+ years ago, but does not recall having numbness/tingling at that time.  She did not need chemotherapy for breast cancer.  NCS/EMG of the legs in 2022 was normal.   Nonsmoker.  She does not drink alcohol .  She is retired Runner, broadcasting/film/video.  She lives alone.    Out-side paper records, electronic medical record, and images have been  reviewed where available and summarized as:  NCS/EMG of the legs 03/09/2021:` This is a normal study of the lower extremities.  In particular, there is no evidence of a large fiber sensorimotor polyneuropathy or lumbosacral radiculopathy.    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HCC)    ectactic 3.5 cm 05/03/19 CTA chest   Brain aneurysm 04/03/2019   Breast cancer (HCC) 08/20/2021   Cancer (HCC) 1994   colon-resection   Cerebral aneurysm 04/03/2019   Cerebral aneurysm, nonruptured 2010   not treated due to location-monitor yearly   Dyspnea on exertion 04/03/2019   Dysrhythmia    afib    GERD (gastroesophageal reflux disease)    infrequent   Guaiac + stool 02/26/2020   History of hiatal hernia    Hypertension    well controlled   Incontinence of urine    Joint pain, knee    Palpitations 04/03/2019   Paroxysmal atrial fibrillation (HCC) CHA2DS2-VASc equals 4, not anticoagulated so far because of history of intracranial bleed and aneurysm 10/28/2019   s/p Watchman Device 03/25/20   Personal history of radiation therapy    Precordial chest pain 04/03/2019   Renal mass    s/p right inferior 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   Stroke [facility])    due to aneurysm rupture in 2010    Urgency of urination     Past Surgical History:  Procedure Laterality Date   ABDOMINAL HYSTERECTOMY     ANEURYSM COILING  2010   cerebral coiling-no deficits   BACK SURGERY     BREAST BIOPSY Right 08/20/2021   BREAST EXCISIONAL BIOPSY Left 04/05/1994   BREAST LUMPECTOMY     BREAST LUMPECTOMY WITH RADIOACTIVE SEED LOCALIZATION Right 09/28/2021   Procedure: RIGHT BREAST LUMPECTOMY WITH RADIOACTIVE SEED LOCALIZATION;  Surgeon: Sim Dryer, MD;  Location: [facility];  Service: General;  Laterality: Right;   CHOLECYSTECTOMY     CYSTOSCOPY  09/03/2012   Procedure: CYSTOSCOPY;  Surgeon: Edmund Gouge, MD;   Location: [facility];  Service: Urology;  Laterality: N/A;   FLEXIBLE SIGMOIDOSCOPY N/A 11/24/2014   Procedure: FLEXIBLE SIGMOIDOSCOPY;  Surgeon: Garrett Kallman, MD;  [facility];  Service: Endoscopy;  Laterality: N/A;   IR EMBO ART  VEN HEMORR LYMPH EXTRAV  INC GUIDE ROADMAPPING  11/18/2020   IR RADIOLOGIST EVAL & MGMT  09/23/2020   IR RADIOLOGIST EVAL & MGMT  12/15/2020   IR RADIOLOGIST EVAL & MGMT  02/23/2021   IR RADIOLOGIST EVAL & MGMT  05/23/2022   IR RADIOLOGIST EVAL & MGMT  11/24/2022   IR RENAL SUPRASEL UNI S&I MOD SED  11/18/2020   IR US  GUIDE VASC ACCESS RIGHT  11/18/2020   LAMINECTOMY     LEFT ATRIAL APPENDAGE OCCLUSION     device implanted 03/25/20 at [facility]   LYSIS OF ADHESION  2000's   with cholecystectomy procedure   PILONIDAL CYST EXCISION     PUBOVAGINAL SLING  09/03/2012   Procedure: Gino Lais;  Surgeon: Edmund Gouge, MD;  Location: [facility];  Service: Urology;  Laterality: N/A;  LYNX SLING    RADIOLOGY WITH ANESTHESIA Right 11/18/2020   Procedure: IR WITH ANESTHESIA RENAL CRYOABLATION;  Surgeon: Marland Silvas, MD;  Location: [facility];  Service: Radiology;  Laterality: Right;     Medications:  Outpatient Encounter Medications as of 12/20/2023  Medication Sig   acetaminophen  (TYLENOL ) 500 MG tablet Take 500-1,000 mg by mouth every 6 (six) hours as needed for mild pain.   aspirin  EC 81 MG tablet Take 1 tablet (81 mg total) by mouth daily. Swallow whole.   cetirizine (ZYRTEC) 10 MG tablet Take 10 mg by mouth daily as needed for allergies.   cholecalciferol (VITAMIN D) 1000 UNITS tablet Take 2,000 Units by mouth daily.    clotrimazole-betamethasone (LOTRISONE) cream Apply 1 Application topically 2 (two) times daily.   EPINEPHrine  (EPIPEN ) 0.3 mg/0.3 mL SOAJ injection Inject 0.3 mLs (0.3 mg total) into the muscle once.   furosemide (LASIX) 20 MG tablet Take 20 mg by mouth daily.    hydrocortisone 2.5 % cream  Apply 2.5 application topically daily as needed (hemorrhoids).   metoprolol  succinate (TOPROL -XL) 50 MG 24 hr tablet Take 1 tablet (50 mg total) by mouth daily.   phenylephrine -shark liver oil-mineral oil-petrolatum (PREPARATION H) 0.25-3-14-71.9 % rectal ointment Place 1 application rectally 2 (two) times daily as needed for hemorrhoids.   Polyethyl Glycol-Propyl Glycol (SYSTANE OP) Place 1 drop into both eyes 2 (two) times daily.   valACYclovir (VALTREX) 1000 MG tablet Take 1,000 mg by mouth daily.   nitroGLYCERIN  (NITROSTAT ) 0.4 MG SL tablet Place 1 tablet (0.4 mg total) under the tongue every 5 (five) minutes as needed for chest pain.   No facility-administered encounter medications on file as of 12/20/2023.    Allergies:  Allergies  Allergen Reactions   Amoxicillin Swelling   Meperidine  And Related Other (See Comments)    Unknown reaction    Wasp Venom Swelling  Family History: Family History  Problem Relation Age of Onset   Seizures Mother    Heart attack Father        Massive Heart Attack   Heart Problems Father    Parkinson's disease Maternal Grandfather    Heart Problems Paternal Grandfather    Breast cancer Cousin    Ovarian cancer Cousin    Diabetes Other     Social History: Social History   Tobacco Use   Smoking status: Never    Passive exposure: Never   Smokeless tobacco: Never  Vaping Use   Vaping status: Never Used  Substance Use Topics   Alcohol  use: No   Drug use: No   Social History   Social History Narrative   Are you right handed or left handed? 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 Retired   Do you live at home alone? Yes   Who lives with you?    What type of home do you live in: 1 story or 2 story? Lives in a one story home and has two steps to get into house.         Vital Signs:  BP 104/68   Pulse 70   Ht 5\' 5"  (1.651 m)   Wt 170 lb (77.1 kg)   SpO2 98%   BMI  28.29 kg/m   Neurological Exam: MENTAL STATUS including orientation to time, place, person, recent and remote memory, attention span and concentration, language, and fund of knowledge is normal.  Speech is not dysarthric.  CRANIAL NERVES: II:  No visual field defects.     III-IV-VI: Pupils equal round and reactive to light.  Normal conjugate, extra-ocular eye movements in all directions of gaze.  No nystagmus.  No ptosis.   V:  Normal facial sensation.    VII:  Normal facial symmetry and movements.   VIII:  Normal hearing and vestibular function.   IX-X:  Normal palatal movement.   XI:  Normal shoulder shrug and head rotation.   XII:  Normal tongue strength and range of motion, no deviation or fasciculation.  MOTOR:  No atrophy, fasciculations or abnormal movements.  No pronator drift.   Upper Extremity:  Right  Left  Deltoid  5/5   5/5   Biceps  5/5   5/5   Triceps  5/5   5/5   Wrist extensors  5/5   5/5   Wrist flexors  5/5   5/5   Finger extensors  5/5   5/5   Finger flexors  5/5   5/5   Dorsal interossei  5/5   5/5   Abductor pollicis  5/5   5/5   Tone (Ashworth scale)  0  0   Lower Extremity:  Right  Left  Hip flexors  5/5   5/5   Knee flexors  5/5   5/5   Knee extensors  5/5   5/5   Dorsiflexors  5/5   5/5   Plantarflexors  5/5   5/5   Toe extensors  5/5   5/5   Toe flexors  5/5   5/5   Tone (Ashworth scale)  0  0   MSRs:  Right        Left brachioradialis 2+  2+  biceps 2+  2+  triceps 2+  2+  patellar 2+  2+  ankle jerk 1+  1+  Hoffman no  no  plantar response down  down   SENSORY:  Vibration is reduced at the left great toe, intact on the right.  Pin prick and temperature is mildly reduced at the feet bilaterally.  Romberg's sign absent.   COORDINATION/GAIT: Normal finger-to- nose-finger.  Intact rapid alternating movements bilaterally.   Gait mildly wide-based, stable, unassisted.  Tandem gait is intact.   Thank  you for allowing me to participate in patient's care.  If I can answer any additional questions, I would be pleased to do so.    Sincerely,    Makalia Bare K. Lydia Sams, DO

## 2023-12-22 DIAGNOSIS — M546 Pain in thoracic spine: Secondary | ICD-10-CM | POA: Diagnosis not present

## 2023-12-25 ENCOUNTER — Ambulatory Visit: Admitting: Neurology

## 2023-12-26 DIAGNOSIS — M546 Pain in thoracic spine: Secondary | ICD-10-CM | POA: Diagnosis not present

## 2023-12-28 DIAGNOSIS — M546 Pain in thoracic spine: Secondary | ICD-10-CM | POA: Diagnosis not present

## 2024-01-03 DIAGNOSIS — M546 Pain in thoracic spine: Secondary | ICD-10-CM | POA: Diagnosis not present

## 2024-01-05 DIAGNOSIS — M546 Pain in thoracic spine: Secondary | ICD-10-CM | POA: Diagnosis not present

## 2024-01-08 DIAGNOSIS — M546 Pain in thoracic spine: Secondary | ICD-10-CM | POA: Diagnosis not present

## 2024-01-09 DIAGNOSIS — I10 Essential (primary) hypertension: Secondary | ICD-10-CM | POA: Diagnosis not present

## 2024-01-09 DIAGNOSIS — I480 Paroxysmal atrial fibrillation: Secondary | ICD-10-CM | POA: Diagnosis not present

## 2024-01-11 DIAGNOSIS — M546 Pain in thoracic spine: Secondary | ICD-10-CM | POA: Diagnosis not present

## 2024-01-22 ENCOUNTER — Ambulatory Visit: Payer: Self-pay | Admitting: Cardiology

## 2024-01-22 DIAGNOSIS — I10 Essential (primary) hypertension: Secondary | ICD-10-CM

## 2024-01-22 DIAGNOSIS — I480 Paroxysmal atrial fibrillation: Secondary | ICD-10-CM | POA: Diagnosis not present

## 2024-01-22 DIAGNOSIS — Z95818 Presence of other cardiac implants and grafts: Secondary | ICD-10-CM

## 2024-01-22 DIAGNOSIS — K2100 Gastro-esophageal reflux disease with esophagitis, without bleeding: Secondary | ICD-10-CM | POA: Diagnosis not present

## 2024-01-24 ENCOUNTER — Telehealth: Payer: Self-pay

## 2024-01-24 DIAGNOSIS — I480 Paroxysmal atrial fibrillation: Secondary | ICD-10-CM

## 2024-01-24 NOTE — Telephone Encounter (Signed)
 Left message on My Chart with monitor results per Dr. Tonja Fray note. Referral made to EP. Routed to PCP.

## 2024-01-25 ENCOUNTER — Telehealth: Payer: Self-pay

## 2024-01-25 NOTE — Telephone Encounter (Signed)
 Pt viewed monitor results on My Chart per Dr. Vanetta Shawl note. Routed to PCP.

## 2024-01-26 ENCOUNTER — Ambulatory Visit: Payer: Self-pay | Admitting: Neurology

## 2024-01-26 ENCOUNTER — Ambulatory Visit: Admitting: Neurology

## 2024-01-26 DIAGNOSIS — R202 Paresthesia of skin: Secondary | ICD-10-CM

## 2024-01-26 DIAGNOSIS — R200 Anesthesia of skin: Secondary | ICD-10-CM

## 2024-01-26 NOTE — Procedures (Signed)
 [facility]rology  [address] [place], [address]  [place], [place] [address] Tel: [phone] Fax: [phone] Test Date:  01/26/2024  Patient: Kimberly Gay DOB: Oct 02, 1940 Physician: Reyna Cava, DO  Sex: Female Height: 5' 5 Ref Phys: Reyna Cava, DO  ID#: [id number]   Technician:    History: This is a 83 year old female referred for evaluation of bilateral feet pain and paresthesias.  NCV & EMG Findings: Extensive electrodiagnostic testing of the right lower extremity and additional studies of the left shows:  Bilateral sural and superficial peroneal sensory responses are within normal limits. Left peroneal motor response at the extensor digitorum brevis is absent, and normal at the tibialis anterior, unchanged compared to prior study.  In isolation, these findings are of uncertain clinical significance.  Right peroneal and bilateral tibial motor responses are within normal limits. Bilateral tibial H reflex study is within normal limits. There is no evidence of active or chronic motor axonal loss changes affecting any of the tested muscles.  Motor unit configuration and recruitment pattern is within normal limits.  Impression: This is a normal study of the lower extremities.  In particular, there is no evidence of a large fiber sensorimotor polyneuropathy or lumbosacral radiculopathy.    ___________________________ Reyna Cava, DO    Nerve Conduction Studies   Stim Site NR Peak (ms) Norm Peak (ms) O-P Amp (V) Norm O-P Amp  Left Sup Peroneal Anti Sensory (Ant Lat Mall)  32 C  12 cm    3.4 <4.6 8.4 >3  Right Sup Peroneal Anti Sensory (Ant Lat Mall)  32 C  12 cm    2.7 <4.6 7.3 >3  Left Sural Anti Sensory (Lat Mall)  32 C  Calf    2.7 <4.6 6.1 >3  Right Sural Anti Sensory (Lat Mall)  32 C  Calf    2.5 <4.6 6.1 >3     Stim Site NR Onset (ms) Norm Onset (ms) O-P Amp (mV) Norm O-P Amp Site1 Site2 Delta-0 (ms) Dist (cm) Vel (m/s) Norm Vel (m/s)  Left Peroneal Motor  (Ext Dig Brev)  32 C  Ankle *NR  <6.0  >2.5 B Fib Ankle  0.0  >40  B Fib *NR     Poplt B Fib  0.0  >40  Poplt *NR            Right Peroneal Motor (Ext Dig Brev)  32 C  Ankle    2.3 <6.0 4.3 >2.5 B Fib Ankle 8.9 39.0 44 >40  B Fib    11.2  4.1  Poplt B Fib 1.7 8.0 47 >40  Poplt    12.9  3.9         Left Peroneal TA Motor (Tib Ant)  32 C  Fib Head    2.7 <4.5 5.5 >3 Poplit Fib Head 1.3 8.0 62 >40  Poplit    4.0 <5.7 5.0         Left Tibial Motor (Abd Hall Brev)  32 C  Ankle    3.6 <6.0 11.5 >4 Knee Ankle 10.3 44.0 43 >40  Knee    13.9  6.5         Right Tibial Motor (Abd Hall Brev)  32 C  Ankle    3.7 <6.0 9.5 >4 Knee Ankle 9.3 44.0 47 >40  Knee    13.0  5.0          Electromyography   Side Muscle Ins.Act Fibs Fasc Recrt Amp Dur Poly Activation Comment  Right  AntTibialis Nml Nml Nml Nml Nml Nml Nml Nml N/A  Right Gastroc Nml Nml Nml Nml Nml Nml Nml Nml N/A  Right Flex Dig Long Nml Nml Nml Nml Nml Nml Nml Nml N/A  Right RectFemoris Nml Nml Nml Nml Nml Nml Nml Nml N/A  Right BicepsFemS Nml Nml Nml Nml Nml Nml Nml Nml N/A  Right GluteusMed Nml Nml Nml Nml Nml Nml Nml Nml N/A  Left AntTibialis Nml Nml Nml Nml Nml Nml Nml Nml N/A  Left Gastroc Nml Nml Nml Nml Nml Nml Nml Nml N/A  Left Flex Dig Long Nml Nml Nml Nml Nml Nml Nml Nml N/A  Left RectFemoris Nml Nml Nml Nml Nml Nml Nml Nml N/A  Left GluteusMed Nml Nml Nml Nml Nml Nml Nml Nml N/A  Left BicepsFemS Nml Nml Nml Nml Nml Nml Nml Nml N/A      Waveforms:

## 2024-01-30 ENCOUNTER — Ambulatory Visit: Attending: Cardiology | Admitting: Cardiology

## 2024-01-30 ENCOUNTER — Encounter: Payer: Self-pay | Admitting: Cardiology

## 2024-01-30 VITALS — BP 134/80 | HR 55 | Ht 65.0 in | Wt 168.0 lb

## 2024-01-30 DIAGNOSIS — R002 Palpitations: Secondary | ICD-10-CM | POA: Diagnosis not present

## 2024-01-30 DIAGNOSIS — I480 Paroxysmal atrial fibrillation: Secondary | ICD-10-CM | POA: Diagnosis not present

## 2024-01-30 DIAGNOSIS — D6869 Other thrombophilia: Secondary | ICD-10-CM | POA: Diagnosis not present

## 2024-01-30 MED ORDER — FLECAINIDE ACETATE 50 MG PO TABS: 50.0000 mg | ORAL_TABLET | Freq: Two times a day (BID) | ORAL | 3 refills | Status: DC

## 2024-01-30 NOTE — Patient Instructions (Addendum)
 Medication Instructions:  Your physician has recommended you make the following change in your medication:  START Flecainide 50 mg twice a day  *If you need a refill on your cardiac medications before your next appointment, please call your pharmacy*  Lab Work: None ordered   Testing/Procedures: None ordered  Follow-Up: At [facility], you and your health needs are our priority.  As part of our continuing mission to provide you with exceptional heart care, our providers are all part of one team.  This team includes your primary Cardiologist (physician) and Advanced Practice Providers or APPs (Physician Assistants and Nurse Practitioners) who all work together to provide you with the care you need, when you need it.  You are scheduled for a nurse visit EKG on 02/13/2024 @ 11:00 am   Your next appointment:   3 month(s)  Provider:   You will follow up in the Atrial Fibrillation Clinic located at [facility]. Your provider will be: Clint R. Fenton, PA-C or Fairy Heinrich, PA-C    Thank you for choosing [employer]!!   Maeola Domino, RN [phone]   Other Instructions  Flecainide Tablets What is this medication? FLECAINIDE (FLEK a nide) prevents and treats a fast or irregular heartbeat (arrhythmia). It is often used to treat a type of arrhythmia known as AFib (atrial fibrillation). It works by slowing down overactive electric signals in the heart, which stabilizes your heart rhythm. It belongs to a group of medications called antiarrhythmics. This medicine may be used for other purposes; ask your health care provider or pharmacist if you have questions. COMMON BRAND NAME(S): Tambocor What should I tell my care team before I take this medication? They need to know if you have any of these conditions: High or low levels of potassium in the blood Heart disease including heart rhythm and heart rate problems Kidney disease Liver disease Recent heart  attack An unusual or allergic reaction to flecainide, other medications, foods, dyes, or preservatives Pregnant or trying to get pregnant Breastfeeding How should I use this medication? Take this medication by mouth with a glass of water . Take it as directed on the prescription label at the same time every day. You can take it with or without food. If it upsets your stomach, take it with food. Do not take your medication more often than directed. Do not stop taking this medication suddenly. This may cause serious, heart-related side effects. If your care team wants you to stop the medication, the dose may be slowly lowered over time to avoid any side effects. Talk to your care team about the use of this medication in children. While it may be prescribed for children as young as 1 year for selected conditions, precautions do apply. Overdosage: If you think you have taken too much of this medicine contact a poison control center or emergency room at once. NOTE: This medicine is only for you. Do not share this medicine with others. What if I miss a dose? If you miss a dose, take it as soon as you can. If it is almost time for your next dose, take only that dose. Do not take double or extra doses. What may interact with this medication? Do not take this medication with any of the following: Amoxapine Arsenic trioxide Certain antibiotics, such as clarithromycin, erythromycin, gatifloxacin, gemifloxacin, levofloxacin, moxifloxacin, sparfloxacin, or troleandomycin Certain antidepressants, called tricyclic antidepressants such as amitriptyline, imipramine, or nortriptyline Certain medications for irregular heartbeat, such as disopyramide, encainide, moricizine, procainamide, propafenone,  and quinidine Cisapride Delavirdine Droperidol Haloperidol [place]orn Imatinib Levomethadyl Maprotiline Medications for malaria, such as chloroquine and halofantrine Pentamidine Phenothiazines, such as  chlorpromazine, mesoridazine, prochlorperazine, thioridazine Pimozide Quinine Ranolazine Ritonavir Sertindole This medication may also interact with the following: Cimetidine Dofetilide Medications for angina or blood pressure Medications for irregular heartbeat, such as amiodarone and digoxin Ziprasidone This list may not describe all possible interactions. Give your health care provider a list of all the medicines, herbs, non-prescription drugs, or dietary supplements you use. Also tell them if you smoke, drink alcohol , or use illegal drugs. Some items may interact with your medicine. What should I watch for while using this medication? Visit your care team for regular checks on your progress. Because your condition and the use of this medication carries some risk, it is a good idea to carry an identification card, necklace, or bracelet with details of your condition, medications, and care team. Check your blood pressure and pulse rate as directed. Know what your blood pressure and pulse rate should be and when tod contact your care team. Your care team may schedule regular blood tests and electrocardiograms to check your progress. This medication may affect your coordination, reaction time, or judgment. Do not drive or operate machinery until you know how this medication affects you. Sit up or stand slowly to reduce the risk of dizzy or fainting spells. Drinking alcohol  with this medication can increase the risk of these side effects. What side effects may I notice from receiving this medication? Side effects that you should report to your care team as soon as possible: Allergic reactions--skin rash, itching, hives, swelling of the face, lips, tongue, or throat Heart failure--shortness of breath, swelling of the ankles, feet, or hands, sudden weight gain, unusual weakness or fatigue Heart rhythm changes--fast or irregular heartbeat, dizziness, feeling faint or lightheaded, chest pain, trouble  breathing Liver injury--right upper belly pain, loss of appetite, nausea, light-colored stool, dark yellow or brown urine, yellowing skin or eyes, unusual weakness or fatigue Side effects that usually do not require medical attention (report to your care team if they continue or are bothersome): Blurry vision Constipation Dizziness Fatigue Headache Nausea Tremors or shaking This list may not describe all possible side effects. Call your doctor for medical advice about side effects. You may report side effects to FDA at 1-800-FDA-1088. Where should I keep my medication? Keep out of the reach of children and pets. Store at room temperature between 15 and 30 degrees C (59 and 86 degrees F). Protect from light. Keep container tightly closed. Throw away any unused medication after the expiration date. NOTE: This sheet is a summary. It may not cover all possible information. If you have questions about this medicine, talk to your doctor, pharmacist, or health care provider.  2024 Elsevier/Gold Standard (2022-03-02 00:00:00)

## 2024-01-30 NOTE — Progress Notes (Signed)
 Electrophysiology Office Note:   Date:  01/30/2024  ID:  Kimberly Gay, DOB May 26, 1941, MRN [id number]  Primary Cardiologist: Lamar Fitch, MD Primary Heart Failure: None Electrophysiologist: Neave Lenger Gladis Norton, MD      History of Present Illness:   Kimberly Gay is a 83 y.o. female with h/o ascending aortic enlargement, breast cancer, colon cancer, cerebral aneurysm nonruptured, atrial fibrillation, hypertension seen today for  for Electrophysiology evaluation of atrial fibrillation at the request of Lamar Fitch.    Over the past few months, she has had an increase in her palpitations.  She has been having some fluctuations in blood pressure.  She has some increased fatigue and shortness of breath when she has palpitations.  She wore a cardiac monitor with a 34% atrial fibrillation burden.  Her longest episode was 2 days.  When she is in atrial fibrillation, she has a significant lack of energy.  She finds it difficult to do some of her daily activities and has to take more breaks throughout the day.  When she is in normal rhythm she feels well and has no acute complaints.  Review of systems complete and found to be negative unless listed in HPI.   EP Information / Studies Reviewed:    EKG is ordered today. Personal review as below.  EKG Interpretation Date/Time:  Tuesday January 30 2024 14:06:11 EDT Ventricular Rate:  55 PR Interval:  190 QRS Duration:  78 QT Interval:  402 QTC Calculation: 384 R Axis:   -21  Text Interpretation: Sinus bradycardia Possible Left atrial enlargement Minimal voltage criteria for LVH, may be normal variant ( R in aVL ) When compared with ECG of 19-Apr-2023 14:03, No significant change was found Confirmed by Montel Vanderhoof ([id number]) on 01/30/2024 2:14:09 PM   Risk Assessment/Calculations:    CHA2DS2-VASc Score = 4   This indicates a 4.8% annual risk of stroke. The patient's score is based upon: CHF History: 0 HTN History: 1 Diabetes History:  0 Stroke History: 0 Vascular Disease History: 0 Age Score: 2 Gender Score: 1            Physical Exam:   VS:  BP 134/80 (BP Location: Right Arm, Patient Position: Sitting, Cuff Size: Large)   Pulse (!) 55   Ht 5' 5 (1.651 m)   Wt 168 lb (76.2 kg)   SpO2 95%   BMI 27.96 kg/m    Wt Readings from Last 3 Encounters:  01/30/24 168 lb (76.2 kg)  12/20/23 170 lb (77.1 kg)  12/18/23 173 lb (78.5 kg)     GEN: Well nourished, well developed in no acute distress NECK: No JVD; No carotid bruits CARDIAC: Regular rate and rhythm, no murmurs, rubs, gallops RESPIRATORY:  Clear to auscultation without rales, wheezing or rhonchi  ABDOMEN: Soft, non-tender, non-distended EXTREMITIES:  No edema; No deformity   ASSESSMENT AND PLAN:    1.  Paroxysmal atrial fibrillation: Has an elevated burden at 32% on most recent cardiac monitor, personally reviewed.  She is unfortunately continued to have episodes of atrial fibrillation making her feel quite poor with weakness and fatigue.  She would prefer rhythm control strategy.  She does have a watchman and has a cerebral aneurysm that is unruptured and untreated.  To avoid anticoagulation, we Narissa Beaufort plan for medical management.  Amaryah Mallen start flecainide 50 mg twice daily.  She Caprice Wasko return in 2 weeks for an EKG.  2.  Secondary hypercoagulable state: Post watchman  3.  Hypertension: Well-controlled    Follow  up with Afib Clinic in 3 months  Signed, Dai Mcadams Gladis Norton, MD

## 2024-02-02 ENCOUNTER — Encounter: Payer: Self-pay | Admitting: Neurology

## 2024-02-02 ENCOUNTER — Other Ambulatory Visit: Payer: Medicare PPO

## 2024-02-13 ENCOUNTER — Ambulatory Visit: Attending: Cardiology

## 2024-02-13 VITALS — BP 138/72 | HR 56

## 2024-02-13 DIAGNOSIS — R002 Palpitations: Secondary | ICD-10-CM

## 2024-02-13 DIAGNOSIS — I480 Paroxysmal atrial fibrillation: Secondary | ICD-10-CM

## 2024-02-13 NOTE — Progress Notes (Signed)
   Nurse Visit   Date of Encounter: 02/13/2024 ID: EDIE VALLANDINGHAM, DOB Nov 27, 1940, MRN [id number]  PCP:  Pridgen, Taylar, NP   [facility] Providers Cardiologist:  Lamar Fitch, MD Electrophysiologist:  Soyla Gladis Norton, MD      Visit Details   VS:  BP 138/72 (BP Location: Left Arm, Patient Position: Sitting, Cuff Size: Normal)   Pulse (!) 56   SpO2 95%  , BMI There is no height or weight on file to calculate BMI.  Wt Readings from Last 3 Encounters:  01/30/24 168 lb (76.2 kg)  12/20/23 170 lb (77.1 kg)  12/18/23 173 lb (78.5 kg)     Reason for visit: EKG after starting Flecainide  Performed today: Vitals, EKG, Provider consulted: Dr. [place], and Education Changes (medications, testing, etc.) : NO changes Length of Visit: 30 minutes  Dr. [place] reviewed EKG. No changes.  Medications Adjustments/Labs and Tests Ordered: Orders Placed This Encounter  Procedures   EKG 12-Lead   No orders of the defined types were placed in this encounter.    Signed, Royal Horns, RN  02/13/2024 11:16 AM

## 2024-02-14 ENCOUNTER — Ambulatory Visit (HOSPITAL_BASED_OUTPATIENT_CLINIC_OR_DEPARTMENT_OTHER)
Admission: RE | Admit: 2024-02-14 | Discharge: 2024-02-14 | Disposition: A | Discharge status: Home / Self Care | Source: Ambulatory Visit | Attending: Family Medicine | Admitting: Family Medicine

## 2024-02-14 DIAGNOSIS — Z13820 Encounter for screening for osteoporosis: Secondary | ICD-10-CM | POA: Diagnosis not present

## 2024-02-14 DIAGNOSIS — M8589 Other specified disorders of bone density and structure, multiple sites: Secondary | ICD-10-CM | POA: Insufficient documentation

## 2024-02-14 DIAGNOSIS — M85852 Other specified disorders of bone density and structure, left thigh: Secondary | ICD-10-CM | POA: Diagnosis not present

## 2024-02-14 DIAGNOSIS — M8580 Other specified disorders of bone density and structure, unspecified site: Secondary | ICD-10-CM

## 2024-02-14 DIAGNOSIS — Z780 Asymptomatic menopausal state: Secondary | ICD-10-CM | POA: Diagnosis not present

## 2024-02-14 DIAGNOSIS — M85851 Other specified disorders of bone density and structure, right thigh: Secondary | ICD-10-CM | POA: Diagnosis not present

## 2024-02-20 ENCOUNTER — Ambulatory Visit
Admission: RE | Admit: 2024-02-20 | Discharge: 2024-02-20 | Disposition: A | Discharge status: Home / Self Care | Source: Ambulatory Visit | Attending: Neurology

## 2024-02-20 DIAGNOSIS — M5126 Other intervertebral disc displacement, lumbar region: Secondary | ICD-10-CM | POA: Diagnosis not present

## 2024-02-20 DIAGNOSIS — R200 Anesthesia of skin: Secondary | ICD-10-CM

## 2024-02-20 DIAGNOSIS — M48061 Spinal stenosis, lumbar region without neurogenic claudication: Secondary | ICD-10-CM | POA: Diagnosis not present

## 2024-02-20 DIAGNOSIS — R202 Paresthesia of skin: Secondary | ICD-10-CM

## 2024-02-20 DIAGNOSIS — M47816 Spondylosis without myelopathy or radiculopathy, lumbar region: Secondary | ICD-10-CM | POA: Diagnosis not present

## 2024-02-27 ENCOUNTER — Ambulatory Visit: Admitting: Neurology

## 2024-02-27 ENCOUNTER — Encounter: Payer: Self-pay | Admitting: Neurology

## 2024-02-27 VITALS — BP 144/75 | HR 60 | Ht 65.0 in | Wt 179.0 lb

## 2024-02-27 DIAGNOSIS — R202 Paresthesia of skin: Secondary | ICD-10-CM

## 2024-02-27 DIAGNOSIS — M546 Pain in thoracic spine: Secondary | ICD-10-CM | POA: Diagnosis not present

## 2024-02-27 DIAGNOSIS — R200 Anesthesia of skin: Secondary | ICD-10-CM | POA: Diagnosis not present

## 2024-02-27 DIAGNOSIS — G8929 Other chronic pain: Secondary | ICD-10-CM

## 2024-02-27 MED ORDER — TIZANIDINE HCL 2 MG PO TABS: 2.0000 mg | ORAL_TABLET | Freq: Every evening | ORAL | 3 refills | Status: DC | PRN

## 2024-02-27 NOTE — Progress Notes (Signed)
 Follow-up Visit   Date: 02/27/2024    Kimberly Gay MRN: [id number] DOB: 1941-06-20    Kimberly Gay is a 83 y.o. right-handed Caucasian female with PAF, hypertension, history of breast cancer, history of colon cancer, CKD, history of ruptured cerebral aneurysm s/p coiling returning to the clinic for follow-up of bilateral feet numbness and new complaints of thoracic pain.  The patient was accompanied to the clinic by self.     IMPRESSION/PLAN: Bilateral feet paresthesias.  No evidence of large fiber neuropathy.  She may have small fiber neuropathy or dynamic nerve impingement at L5-S1.  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  Imaging was reviewed with patient.  Skin biopsy was declined, she may consider if symptoms get worse.   Right thoracic pain, ?thoracic radiculopathy.  PT completed with transient benefit.   - MRI thoracic spine wo contrast  - She has previously tried gabapentin   - Start tizanidine  2mg  at bedtime as needed  Return to clinic in 4 months  --------------------------------------------- History of present illness: Starting in 2022, she began having cold sensation in both feet. She also has numbness and tingling in the feet, worse in the left. Symptoms are present at all times but can intensify without any specific triggers. Prolonged walking can aggravate her feet. She also complains of mid-back pain and is doing PT. No history of diabetes. She had chemotherapy for colon cancer 30+ years ago, but does not recall having numbness/tingling at that time. She did not need chemotherapy for breast cancer. NCS/EMG of the legs in 2022 was normal.   UPDATE 02/27/2024:  She is here for follow-up visit.  She continues to have spells of numbness/tingling in the feet.  NCS/EMG of the legs was normal.  MRI lumbar spine shows degenerative changes no specific nerve impingement.    For the past several years, she also complains of burning pain over the mid-thoracic  region which radiates to the right.  Pain is intermittent throughout the day, but very bothersome.   Medications:  Current Outpatient Medications on File Prior to Visit  Medication Sig Dispense Refill   acetaminophen  (TYLENOL ) 500 MG tablet Take 500-1,000 mg by mouth every 6 (six) hours as needed for mild pain.     aspirin  EC 81 MG tablet Take 1 tablet (81 mg total) by mouth daily. Swallow whole. 90 tablet 3   cetirizine (ZYRTEC) 10 MG tablet Take 10 mg by mouth daily as needed for allergies.     cholecalciferol (VITAMIN D) 1000 UNITS tablet Take 2,000 Units by mouth daily.      clotrimazole-betamethasone (LOTRISONE) cream Apply 1 Application topically 2 (two) times daily.     EPINEPHrine  (EPIPEN ) 0.3 mg/0.3 mL SOAJ injection Inject 0.3 mLs (0.3 mg total) into the muscle once. 1 Device 0   flecainide  (TAMBOCOR ) 50 MG tablet Take 1 tablet (50 mg total) by mouth 2 (two) times daily. 60 tablet 3   furosemide (LASIX) 20 MG tablet Take 20 mg by mouth daily.      hydrocortisone 2.5 % cream Apply 2.5 application topically daily as needed (hemorrhoids).     metoprolol  succinate (TOPROL -XL) 50 MG 24 hr tablet Take 1 tablet (50 mg total) by mouth daily. 90 tablet 1   nitroGLYCERIN  (NITROSTAT ) 0.4 MG SL tablet Place 1 tablet (0.4 mg total) under the tongue every 5 (five) minutes as needed for chest pain. 25 tablet 11   phenylephrine -shark liver oil-mineral oil-petrolatum (PREPARATION H) 0.25-3-14-71.9 % rectal ointment Place 1  application rectally 2 (two) times daily as needed for hemorrhoids.     Polyethyl Glycol-Propyl Glycol (SYSTANE OP) Place 1 drop into both eyes 2 (two) times daily.     valACYclovir (VALTREX) 1000 MG tablet Take 1,000 mg by mouth daily.     No current facility-administered medications on file prior to visit.    Allergies:  Allergies  Allergen Reactions   Amoxicillin Swelling   Meperidine  And Related Other (See Comments)    Unknown reaction    Wasp Venom Swelling    Vital  Signs:  BP (!) 144/75   Pulse 60   Ht 5' 5 (1.651 m)   Wt 179 lb (81.2 kg)   SpO2 96%   BMI 29.79 kg/m   Neurological Exam: MENTAL STATUS including orientation to time, place, person, recent and remote memory, attention span and concentration, language, and fund of knowledge is normal.  Speech is not dysarthric.  CRANIAL NERVES:  Pupils equal round and reactive to light.  Normal conjugate, extra-ocular eye movements in all directions of gaze.  No ptosis.  Face is symmetric.   MOTOR:  Motor strength is 5/5 in all extremities.  No atrophy, fasciculations or abnormal movements.  No pronator drift.  Tone is normal.    MSRs:  Reflexes are 2+/4 throughout, except 1+/4 at the ankles.  SENSORY:  Reduced vibration at the left ankle, otherwise intact throughout.  Temperature intact.  COORDINATION/GAIT:  Normal finger-to- nose-finger.  Intact rapid alternating movements bilaterally.  Gait mildly wide-based and stable. Unassisted.   Data: NCS/EMG of the legs 01/26/2024:  Normal  MRI lumbar spine 02/22/2024: 1. 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 2. 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    Thank you for allowing me to participate in patient's care.  If I can answer any additional questions, I would be pleased to do so.    Sincerely,    Annalese Stiner K. Tobie, DO

## 2024-03-07 DIAGNOSIS — M47816 Spondylosis without myelopathy or radiculopathy, lumbar region: Secondary | ICD-10-CM | POA: Diagnosis not present

## 2024-03-07 DIAGNOSIS — M8580 Other specified disorders of bone density and structure, unspecified site: Secondary | ICD-10-CM | POA: Diagnosis not present

## 2024-03-07 DIAGNOSIS — N1831 Chronic kidney disease, stage 3a: Secondary | ICD-10-CM | POA: Diagnosis not present

## 2024-03-12 ENCOUNTER — Ambulatory Visit
Admission: RE | Admit: 2024-03-12 | Discharge: 2024-03-12 | Disposition: A | Discharge status: Home / Self Care | Source: Ambulatory Visit | Attending: Neurology

## 2024-03-12 DIAGNOSIS — M47814 Spondylosis without myelopathy or radiculopathy, thoracic region: Secondary | ICD-10-CM | POA: Diagnosis not present

## 2024-03-12 DIAGNOSIS — G8929 Other chronic pain: Secondary | ICD-10-CM

## 2024-03-12 DIAGNOSIS — R200 Anesthesia of skin: Secondary | ICD-10-CM

## 2024-03-15 ENCOUNTER — Other Ambulatory Visit: Payer: Self-pay | Admitting: Cardiology

## 2024-03-19 ENCOUNTER — Ambulatory Visit: Payer: Self-pay | Admitting: Neurology

## 2024-03-20 ENCOUNTER — Encounter: Payer: Self-pay | Admitting: Cardiology

## 2024-03-20 ENCOUNTER — Ambulatory Visit: Attending: Cardiology | Admitting: Cardiology

## 2024-03-20 VITALS — BP 130/84 | HR 57 | Ht 65.0 in | Wt 178.1 lb

## 2024-03-20 DIAGNOSIS — I10 Essential (primary) hypertension: Secondary | ICD-10-CM | POA: Diagnosis not present

## 2024-03-20 DIAGNOSIS — Z95818 Presence of other cardiac implants and grafts: Secondary | ICD-10-CM

## 2024-03-20 DIAGNOSIS — I480 Paroxysmal atrial fibrillation: Secondary | ICD-10-CM | POA: Diagnosis not present

## 2024-03-20 DIAGNOSIS — K2100 Gastro-esophageal reflux disease with esophagitis, without bleeding: Secondary | ICD-10-CM | POA: Diagnosis not present

## 2024-03-20 MED ORDER — METOPROLOL SUCCINATE ER 50 MG PO TB24: 50.0000 mg | ORAL_TABLET | Freq: Every day | ORAL | 3 refills | Status: AC

## 2024-03-20 NOTE — Progress Notes (Signed)
 Cardiology Office Note:    Date:  03/20/2024   ID:  Kimberly Gay, DOB 03/04/1941, MRN [id number]  PCP:  [name], MD  Cardiologist:  Lamar Fitch, MD    Referring MD: Pridgen, Taylar, NP   No chief complaint on file.   History of Present Illness:    Kimberly Gay is a 83 y.o. female past medical history significant for paroxysmal atrial fibrillation, not anticoagulated secondary to intracranial bleed secondary to ruptured aneurysm she does have Watchman device, additional problem include essential hypertension, dyslipidemia colon cancer s/p resection, renal cancer, breast cancer status post lumpectomy and radiation.  Comes today 2 months for follow-up.  She did have more frequent episode of atrial fibrillation.  She was put on flecainide .  Since that time she is doing better she said she can do much more.  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 and continue working the way she was working before happy describe however the fact that she gained some weight and she thinks related to flecainide   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HCC)    ectactic 3.5 cm 05/03/19 CTA chest   Brain aneurysm 04/03/2019   Breast cancer (HCC) 08/20/2021   Cancer (HCC) 1994   colon-resection   Cerebral aneurysm 04/03/2019   Cerebral aneurysm, nonruptured 2010   not treated due to location-monitor yearly   Dyspnea on exertion 04/03/2019   Dysrhythmia    afib    GERD (gastroesophageal reflux disease)    infrequent   Guaiac + stool 02/26/2020   History of hiatal hernia    Hypertension    well controlled   Incontinence of urine    Joint pain, knee    Palpitations 04/03/2019   Paroxysmal atrial fibrillation (HCC) CHA2DS2-VASc equals 4, not anticoagulated so far because of history of intracranial bleed and aneurysm 10/28/2019   s/p Watchman Device 03/25/20   Personal history of radiation therapy    Precordial chest pain 04/03/2019   Renal mass    s/p right  inferior 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 2010    Urgency of urination     Past Surgical History:  Procedure Laterality Date   ABDOMINAL HYSTERECTOMY     ANEURYSM COILING  2010   cerebral coiling-no deficits   BACK SURGERY     BREAST BIOPSY Right 08/20/2021   BREAST EXCISIONAL BIOPSY Left 04/05/1994   BREAST LUMPECTOMY     BREAST LUMPECTOMY WITH RADIOACTIVE SEED LOCALIZATION Right 09/28/2021   Procedure: RIGHT BREAST LUMPECTOMY WITH RADIOACTIVE SEED LOCALIZATION;  Surgeon: Vanderbilt Ned, MD;  Location: [facility] OR;  Service: General;  Laterality: Right;   CHOLECYSTECTOMY     CYSTOSCOPY  09/03/2012   Procedure: CYSTOSCOPY;  Surgeon: Arlena LILLETTE Gal, MD;  Location: [facility];  Service: Urology;  Laterality: N/A;   FLEXIBLE SIGMOIDOSCOPY N/A 11/24/2014   Procedure: FLEXIBLE SIGMOIDOSCOPY;  Surgeon: Gladis MARLA Louder, MD;  Location: [facility];  Service: Endoscopy;  Laterality: N/A;   IR EMBO ART  VEN HEMORR LYMPH EXTRAV  INC GUIDE ROADMAPPING  11/18/2020   IR RADIOLOGIST EVAL & MGMT  09/23/2020   IR RADIOLOGIST EVAL & MGMT  12/15/2020   IR RADIOLOGIST EVAL & MGMT  02/23/2021   IR RADIOLOGIST EVAL & MGMT  05/23/2022   IR RADIOLOGIST EVAL & MGMT  11/24/2022   IR RENAL SUPRASEL UNI  S&I MOD SED  11/18/2020   IR US  GUIDE VASC ACCESS RIGHT  11/18/2020   LAMINECTOMY     LEFT ATRIAL APPENDAGE OCCLUSION     device implanted 03/25/20 at [facility]   LYSIS OF ADHESION  2000's   with cholecystectomy procedure   PILONIDAL CYST EXCISION     PUBOVAGINAL SLING  09/03/2012   Procedure: CARLOYN GLADE;  Surgeon: Arlena LILLETTE Gal, MD;  Location: [facility];  Service: Urology;  Laterality: N/A;  LYNX SLING    RADIOLOGY WITH ANESTHESIA Right 11/18/2020   Procedure: IR WITH ANESTHESIA RENAL CRYOABLATION;  Surgeon: Johann Sieving, MD;  Location: [facility];  Service:  Radiology;  Laterality: Right;    Current Medications: Current Meds  Medication Sig   acetaminophen  (TYLENOL ) 500 MG tablet Take 500-1,000 mg by mouth every 6 (six) hours as needed for mild pain.   aspirin  EC 81 MG tablet Take 1 tablet (81 mg total) by mouth daily. Swallow whole.   cetirizine (ZYRTEC) 10 MG tablet Take 10 mg by mouth daily as needed for allergies.   cholecalciferol (VITAMIN D) 1000 UNITS tablet Take 2,000 Units by mouth daily.    clotrimazole-betamethasone (LOTRISONE) cream Apply 1 Application topically 2 (two) times daily.   EPINEPHrine  (EPIPEN ) 0.3 mg/0.3 mL SOAJ injection Inject 0.3 mLs (0.3 mg total) into the muscle once.   flecainide  (TAMBOCOR ) 50 MG tablet Take 1 tablet (50 mg total) by mouth 2 (two) times daily.   furosemide (LASIX) 20 MG tablet Take 20 mg by mouth daily.    hydrocortisone 2.5 % cream Apply 2.5 application topically daily as needed (hemorrhoids).   metoprolol  succinate (TOPROL -XL) 50 MG 24 hr tablet Take 1 tablet (50 mg total) by mouth daily. Please keep scheduled appointment for future refills. Thank you.   nitroGLYCERIN  (NITROSTAT ) 0.4 MG SL tablet Place 1 tablet (0.4 mg total) under the tongue every 5 (five) minutes as needed for chest pain.   phenylephrine -shark liver oil-mineral oil-petrolatum (PREPARATION H) 0.25-3-14-71.9 % rectal ointment Place 1 application rectally 2 (two) times daily as needed for hemorrhoids.   Polyethyl Glycol-Propyl Glycol (SYSTANE OP) Place 1 drop into both eyes 2 (two) times daily.   tiZANidine  (ZANAFLEX ) 2 MG tablet Take 1 tablet (2 mg total) by mouth at bedtime as needed for muscle spasms.   valACYclovir (VALTREX) 1000 MG tablet Take 1,000 mg by mouth daily.     Allergies:   Amoxicillin, Meperidine  and related, and Wasp venom   Social History   Socioeconomic History   Marital status: Widowed    Spouse name: Not on file   Number of children: Not on file   Years of education: Not on file   Highest education  level: Not on file  Occupational History   Not on file  Tobacco Use   Smoking status: Never    Passive exposure: Never   Smokeless tobacco: Never  Vaping Use   Vaping status: Never Used  Substance and Sexual Activity   Alcohol  use: No   Drug use: No   Sexual activity: Not on file  Other Topics Concern   Not on file  Social History Narrative   Are you right handed or left handed? 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 Retired   Do you live at home alone? Yes   Who lives with you?    What type of home do you live in: 1 story or 2  story? Lives in a one story home and has two steps to get into house.        Social Drivers of Corporate investment banker Strain: Not on file  Food Insecurity: Not on file  Transportation Needs: Not on file  Physical Activity: Not on file  Stress: Not on file  Social Connections: Not on file     Family History: The patient's family history includes Breast cancer in her cousin; Diabetes in an other family member; Heart Problems in her father and paternal grandfather; Heart attack in her father; Ovarian cancer in her cousin; Parkinson's disease in her maternal grandfather; Seizures in her mother. ROS:   Please see the history of present illness.    All 14 point review of systems negative except as described per history of present illness  EKGs/Labs/Other Studies Reviewed:         Recent Labs: No results found for requested labs within last 365 days.  Recent Lipid Panel No results found for: CHOL, TRIG, HDL, CHOLHDL, VLDL, LDLCALC, LDLDIRECT  Physical Exam:    VS:  BP 130/84   Pulse (!) 57   Ht 5' 5 (1.651 m)   Wt 178 lb 1.9 oz (80.8 kg)   SpO2 97%   BMI 29.64 kg/m     Wt Readings from Last 3 Encounters:  03/20/24 178 lb 1.9 oz (80.8 kg)  02/27/24 179 lb (81.2 kg)  01/30/24 168 lb (76.2 kg)     GEN:  Well nourished, well developed in no acute  distress HEENT: Normal NECK: No JVD; No carotid bruits LYMPHATICS: No lymphadenopathy CARDIAC: RRR, no murmurs, no rubs, no gallops RESPIRATORY:  Clear to auscultation without rales, wheezing or rhonchi  ABDOMEN: Soft, non-tender, non-distended MUSCULOSKELETAL:  No edema; No deformity  SKIN: Warm and dry LOWER EXTREMITIES: no swelling NEUROLOGIC:  Alert and oriented x 3 PSYCHIATRIC:  Normal affect   ASSESSMENT:    1. Paroxysmal atrial fibrillation (HCC)   2. Primary hypertension   3. Gastroesophageal reflux disease with esophagitis, unspecified whether hemorrhage   4. Presence of Watchman left atrial appendage closure device    PLAN:    In order of problems listed above:  Paroxysmal atrial fibrillation, maintaining sinus rhythm, on flecainide , followed by EP clinic feeling much better with flecainide . Essential hypertension, blood pressure well-controlled. Gastroesophageal reflux disease.  Present noted. Watchman device present.  Noted   Medication Adjustments/Labs and Tests Ordered: Current medicines are reviewed at length with the patient today.  Concerns regarding medicines are outlined above.  Orders Placed This Encounter  Procedures   EKG 12-Lead   Medication changes: No orders of the defined types were placed in this encounter.   Signed, Lamar DOROTHA Fitch, MD, [facility] 03/20/2024 11:39 AM    [facility]

## 2024-03-20 NOTE — Patient Instructions (Signed)

## 2024-05-01 ENCOUNTER — Ambulatory Visit (HOSPITAL_COMMUNITY): Admitting: Internal Medicine

## 2024-05-08 DIAGNOSIS — Z23 Encounter for immunization: Secondary | ICD-10-CM | POA: Diagnosis not present

## 2024-05-08 DIAGNOSIS — Z1331 Encounter for screening for depression: Secondary | ICD-10-CM | POA: Diagnosis not present

## 2024-05-08 DIAGNOSIS — Z0000 Encounter for general adult medical examination without abnormal findings: Secondary | ICD-10-CM | POA: Diagnosis not present

## 2024-05-12 ENCOUNTER — Other Ambulatory Visit: Payer: Self-pay | Admitting: Cardiology

## 2024-05-13 ENCOUNTER — Encounter (HOSPITAL_COMMUNITY): Payer: Self-pay | Admitting: Internal Medicine

## 2024-05-13 ENCOUNTER — Ambulatory Visit (HOSPITAL_COMMUNITY)
Admission: RE | Admit: 2024-05-13 | Discharge: 2024-05-13 | Disposition: A | Discharge status: Home / Self Care | Source: Ambulatory Visit | Attending: Internal Medicine | Admitting: Internal Medicine

## 2024-05-13 VITALS — BP 138/92 | HR 59 | Ht 65.0 in | Wt 181.4 lb

## 2024-05-13 DIAGNOSIS — I480 Paroxysmal atrial fibrillation: Secondary | ICD-10-CM | POA: Diagnosis not present

## 2024-05-13 DIAGNOSIS — Z79899 Other long term (current) drug therapy: Secondary | ICD-10-CM

## 2024-05-13 DIAGNOSIS — Z5181 Encounter for therapeutic drug level monitoring: Secondary | ICD-10-CM | POA: Diagnosis not present

## 2024-05-13 MED ORDER — FLECAINIDE ACETATE 50 MG PO TABS: 50.0000 mg | ORAL_TABLET | Freq: Two times a day (BID) | ORAL | 3 refills | Status: AC

## 2024-05-13 MED ORDER — FLECAINIDE ACETATE 50 MG PO TABS: 50.0000 mg | ORAL_TABLET | Freq: Two times a day (BID) | ORAL | 3 refills | Status: DC

## 2024-05-13 NOTE — Progress Notes (Signed)
 Primary Care Physician: [name], MD Primary Cardiologist: Lamar Fitch, MD Electrophysiologist: Will Gladis Norton, MD     Referring Physician: Dr. Norton Kimberly Gay Kimberly Gay is a 83 y.o. female with a history of ascending aortic enlargement, cerebral aneurysm unruptured, breast cancer, Watchman device implantation at [facility] 03/26/2020, colon cancer, HTN, and paroxysmal atrial fibrillation who presents for consultation in the [facility]. Seen by Dr. Norton on 01/30/24 and started on flecainide  due to elevated Afib burden on monitor. Not on OAC due to Watchman / unruptured cerebral aneurysm. Patient has a CHADS2VASC score of 4.  On evaluation today, patient is currently in NSR. Patient is here for flecainide  surveillance. She feels much better overall since starting flecainide . She notes more energy and less fatigue. She notes some dizziness with change in position but it has improved a little.   Today, she denies symptoms of palpitations, chest pain, shortness of breath, orthopnea, PND, lower extremity edema, dizziness, presyncope, syncope, bleeding, or neurologic sequela. The patient is tolerating medications without difficulties and is otherwise without complaint today.    she has a BMI of Body mass index is 30.19 kg/m.SABRA Filed Weights   05/13/24 1050  Weight: 82.3 kg    Current Outpatient Medications  Medication Sig Dispense Refill   acetaminophen  (TYLENOL ) 500 MG tablet Take 500-1,000 mg by mouth every 6 (six) hours as needed for mild pain.     aspirin  EC 81 MG tablet Take 1 tablet (81 mg total) by mouth daily. Swallow whole. 90 tablet 3   cetirizine (ZYRTEC) 10 MG tablet Take 10 mg by mouth daily as needed for allergies.     cholecalciferol (VITAMIN D) 1000 UNITS tablet Take 2,000 Units by mouth daily.      clotrimazole-betamethasone (LOTRISONE) cream Apply 1 Application topically 2 (two) times daily.     EPINEPHrine  (EPIPEN ) 0.3 mg/0.3 mL SOAJ  injection Inject 0.3 mLs (0.3 mg total) into the muscle once. 1 Device 0   furosemide (LASIX) 20 MG tablet Take 20 mg by mouth daily.      hydrocortisone 2.5 % cream Apply 2.5 application topically daily as needed (hemorrhoids).     metoprolol  succinate (TOPROL -XL) 50 MG 24 hr tablet Take 1 tablet (50 mg total) by mouth daily. 90 tablet 3   nitroGLYCERIN  (NITROSTAT ) 0.4 MG SL tablet Place 1 tablet (0.4 mg total) under the tongue every 5 (five) minutes as needed for chest pain. 25 tablet 11   phenylephrine -shark liver oil-mineral oil-petrolatum (PREPARATION H) 0.25-3-14-71.9 % rectal ointment Place 1 application rectally 2 (two) times daily as needed for hemorrhoids.     Polyethyl Glycol-Propyl Glycol (SYSTANE OP) Place 1 drop into both eyes 2 (two) times daily.     tiZANidine  (ZANAFLEX ) 2 MG tablet Take 1 tablet (2 mg total) by mouth at bedtime as needed for muscle spasms. 30 tablet 3   valACYclovir (VALTREX) 1000 MG tablet Take 1,000 mg by mouth daily.     flecainide  (TAMBOCOR ) 50 MG tablet Take 1 tablet (50 mg total) by mouth 2 (two) times daily. 180 tablet 3   No current facility-administered medications for this encounter.    Atrial Fibrillation Management history:  Previous antiarrhythmic drugs: flecainide  Previous cardioversions:  Previous ablations: none Anticoagulation history: Eliquis ; Watchman 03/26/20 at [facility]   ROS- All systems are reviewed and negative except as per the HPI above.  Physical Exam: BP (!) 138/92   Pulse (!) 59   Ht 5' 5 (1.651 m)  Wt 82.3 kg   BMI 30.19 kg/m   GEN: Well nourished, well developed in no acute distress NECK: No JVD; No carotid bruits CARDIAC: Regular rate and rhythm, no murmurs, rubs, gallops RESPIRATORY:  Clear to auscultation without rales, wheezing or rhonchi  ABDOMEN: Soft, non-tender, non-distended EXTREMITIES:  No edema; No deformity   EKG today demonstrates  Vent. rate 59 BPM PR interval 212 ms QRS duration 88 ms QT/QTcB  420/415 ms P-R-T axes 38 -38 10 Sinus bradycardia with 1st degree A-V block Left axis deviation Moderate voltage criteria for LVH, may be normal variant ( R in aVL , Cornell product ) Septal infarct , age undetermined Abnormal ECG When compared with ECG of 20-Mar-2024 11:12, Previous ECG is present  Echo 09/20/21 demonstrated  1. Left ventricular ejection fraction, by estimation, is 60 to 65%. The  left ventricle has normal function. The left ventricle has no regional  wall motion abnormalities. There is mild left ventricular hypertrophy.  Left ventricular diastolic parameters  are consistent with Grade I diastolic dysfunction (impaired relaxation).   2. Right ventricular systolic function is normal. The right ventricular  size is mildly enlarged.   3. The mitral valve is normal in structure. Mild to moderate mitral valve  regurgitation. No evidence of mitral stenosis.   4. Tricuspid valve regurgitation is mild to moderate.   5. The aortic valve is normal in structure. Aortic valve regurgitation is  not visualized. No aortic stenosis is present.   6. The inferior vena cava is normal in size with greater than 50%  respiratory variability, suggesting right atrial pressure of 3 mmHg.   ASSESSMENT & PLAN CHA2DS2-VASc Score = 4  The patient's score is based upon: CHF History: 0 HTN History: 1 Diabetes History: 0 Stroke History: 0 Vascular Disease History: 0 Age Score: 2 Gender Score: 1       ASSESSMENT AND PLAN: Paroxysmal Atrial Fibrillation (ICD10:  I48.0) The patient's CHA2DS2-VASc score is 4, indicating a 4.8% annual risk of stroke.    Patient is currently in NSR. She is overall happy with current regimen. Will continue without change.  Secondary Hypercoagulable State (ICD10:  D68.69) The patient is at significant risk for stroke/thromboembolism based upon her CHA2DS2-VASc Score of 4.  Patient has history of Watchman procedure 03/26/20 by [facility] and is not on anticoagulation.  She takes an ASA 81 mg daily.   High risk medication monitoring (ICD10: [id number]) Patient requires ongoing monitoring for anti-arrhythmic medication which has the potential to cause life threatening arrhythmias or AV block. ECG intervals are stable. Continue flecainide  50 mg BID.    Follow up 6 months Afib clinic for flecainide  surveillance.    Terra Pac, [facility]  Afib Clinic [address] [place], [place] [address] [phone]

## 2024-06-12 DIAGNOSIS — J449 Chronic obstructive pulmonary disease, unspecified: Secondary | ICD-10-CM | POA: Diagnosis not present

## 2024-06-12 DIAGNOSIS — I4891 Unspecified atrial fibrillation: Secondary | ICD-10-CM | POA: Diagnosis not present

## 2024-06-12 DIAGNOSIS — I700 Atherosclerosis of aorta: Secondary | ICD-10-CM | POA: Diagnosis not present

## 2024-06-12 DIAGNOSIS — I25119 Atherosclerotic heart disease of native coronary artery with unspecified angina pectoris: Secondary | ICD-10-CM | POA: Diagnosis not present

## 2024-06-12 DIAGNOSIS — R32 Unspecified urinary incontinence: Secondary | ICD-10-CM | POA: Diagnosis not present

## 2024-06-12 DIAGNOSIS — M1990 Unspecified osteoarthritis, unspecified site: Secondary | ICD-10-CM | POA: Diagnosis not present

## 2024-06-12 DIAGNOSIS — K219 Gastro-esophageal reflux disease without esophagitis: Secondary | ICD-10-CM | POA: Diagnosis not present

## 2024-06-12 DIAGNOSIS — I130 Hypertensive heart and chronic kidney disease with heart failure and stage 1 through stage 4 chronic kidney disease, or unspecified chronic kidney disease: Secondary | ICD-10-CM | POA: Diagnosis not present

## 2024-06-12 DIAGNOSIS — I509 Heart failure, unspecified: Secondary | ICD-10-CM | POA: Diagnosis not present

## 2024-06-14 ENCOUNTER — Other Ambulatory Visit: Payer: Self-pay | Admitting: Family Medicine

## 2024-06-14 DIAGNOSIS — Z9889 Other specified postprocedural states: Secondary | ICD-10-CM

## 2024-06-22 DIAGNOSIS — R059 Cough, unspecified: Secondary | ICD-10-CM | POA: Diagnosis not present

## 2024-06-22 DIAGNOSIS — J0100 Acute maxillary sinusitis, unspecified: Secondary | ICD-10-CM | POA: Diagnosis not present

## 2024-06-22 DIAGNOSIS — R0981 Nasal congestion: Secondary | ICD-10-CM | POA: Diagnosis not present

## 2024-06-24 ENCOUNTER — Encounter: Payer: Self-pay | Admitting: Neurology

## 2024-06-24 ENCOUNTER — Ambulatory Visit: Admitting: Neurology

## 2024-07-15 ENCOUNTER — Encounter: Payer: Medicare PPO | Admitting: Adult Health

## 2024-07-18 ENCOUNTER — Ambulatory Visit
Admission: RE | Admit: 2024-07-18 | Discharge: 2024-07-18 | Disposition: A | Discharge status: Home / Self Care | Source: Ambulatory Visit | Attending: Family Medicine | Admitting: Family Medicine

## 2024-07-18 DIAGNOSIS — R928 Other abnormal and inconclusive findings on diagnostic imaging of breast: Secondary | ICD-10-CM | POA: Diagnosis not present

## 2024-07-18 DIAGNOSIS — Z9889 Other specified postprocedural states: Secondary | ICD-10-CM

## 2024-07-22 ENCOUNTER — Encounter: Payer: Self-pay | Admitting: Adult Health

## 2024-07-22 ENCOUNTER — Inpatient Hospital Stay: Attending: Adult Health | Admitting: Adult Health

## 2024-07-22 VITALS — BP 147/60 | HR 54 | Temp 97.8°F | Resp 17 | Ht 65.0 in | Wt 182.2 lb

## 2024-07-22 DIAGNOSIS — Z86000 Personal history of in-situ neoplasm of breast: Secondary | ICD-10-CM | POA: Insufficient documentation

## 2024-07-22 DIAGNOSIS — D0511 Intraductal carcinoma in situ of right breast: Secondary | ICD-10-CM

## 2024-07-22 DIAGNOSIS — Z803 Family history of malignant neoplasm of breast: Secondary | ICD-10-CM | POA: Diagnosis not present

## 2024-07-22 DIAGNOSIS — Z923 Personal history of irradiation: Secondary | ICD-10-CM | POA: Diagnosis not present

## 2024-07-22 DIAGNOSIS — Z8041 Family history of malignant neoplasm of ovary: Secondary | ICD-10-CM | POA: Diagnosis not present

## 2024-07-22 NOTE — Progress Notes (Signed)
 [facility] Cancer Follow up:    Kimberly Planas, MD [address] [place] [address]   DIAGNOSIS: Cancer Staging  Ductal carcinoma in situ (DCIS) of right breast Staging form: Breast, AJCC 8th Edition - Clinical stage from 09/23/2021: Stage 0 (cTis (DCIS), cN0, cM0, GX, ER+, PR+) - Signed by Odean Potts, MD on 09/23/2021 Histologic grading system: 3 grade system    SUMMARY OF ONCOLOGIC HISTORY: Oncology History  Ductal carcinoma in situ (DCIS) of right breast  08/20/2021 Initial Diagnosis   Screening mammogram: right breast calcifications. Diagnostic mammogram: Indeterminate 1.2 cm group of retroareolar right breast calcifications. Biopsy: intraductal papilloma with DCIS, ER+(100%)/PR+(20%).    09/23/2021 Cancer Staging   Staging form: Breast, AJCC 8th Edition - Clinical stage from 09/23/2021: Stage 0 (cTis (DCIS), cN0, cM0, GX, ER+, PR+) - Signed by Odean Potts, MD on 09/23/2021 Histologic grading system: 3 grade system   09/28/2021 Surgery   09/28/21: Right Lumpectomy: IG-HG DCIS Margin Positive We discussed her case in the breast tumor board and decided that she does not need additional surgery.   11/08/2021 - 12/06/2021 Radiation Therapy   11/08/2021 through 12/06/2021 Site Technique Total Dose (Gy) Dose per Fx (Gy) Completed Fx Beam Energies  Breast, Right: Breast_R 3D 42.56/42.56 2.66 16/16 6XFFF  Breast, Right: Breast_R_Bst specialPort 10/10 2.5 4/4 12E, 15E       CURRENT THERAPY: observation  INTERVAL HISTORY:  Discussed the use of AI scribe software for clinical note transcription with the patient, who gave verbal consent to proceed.  History of Present Illness Kimberly Gay is an 83 year old female with right breast ductal carcinoma in situ, status post lumpectomy and adjuvant radiation, who presents for routine oncology follow-up.  She is status post lumpectomy and adjuvant radiation for right breast ductal carcinoma in situ, ER/PR positive, and  declined anti-estrogen therapy. Her mammogram on July 18, 2024 showed no mammographic evidence of malignancy with breast density category B. She has not noticed breast changes, palpable masses, skin changes, or nipple discharge over the past year.  She has intermittent back pain that is episodic and sometimes associated with bowel symptoms or structural causes. It has made daily exercise more challenging, though she continues to attempt regular physical activity.  She has intermittent cognitive dysfunction and persistent fatigue similar to symptoms during radiation therapy, most pronounced in the mornings. She questions if this is related to age, medications, or other conditions. She takes 2,000 units of vitamin D daily and plans to discuss further evaluation, including labs, with her primary care provider.  She consumes two to three servings of fruits and vegetables each daily and has no other new symptoms or concerns.     Patient Active Problem List   Diagnosis Date Noted   Other specified diseases of biliary tract 03/21/2022   Ductal carcinoma in situ (DCIS) of right breast 09/02/2021   Stroke (HCC) 09/01/2021   Dysrhythmia 09/01/2021   Ascending aorta enlargement 09/01/2021   Decreased estrogen level 12/30/2020   Dilated cbd, acquired 12/30/2020   Disorder of kidney and ureter, unspecified 12/30/2020   Dysphagia 12/30/2020   External hemorrhoids 12/30/2020   Osteopenia 12/30/2020   Hypercalcemia 12/30/2020   History of malignant neoplasm of colon 12/30/2020   Vitamin D deficiency 12/30/2020   Paresthesia 12/30/2020   Renal mass, right 11/18/2020   Presence of Watchman left atrial appendage closure device 06/29/2020   Urgency of urination    Joint pain, knee    Incontinence of urine  Hypertension    Gastritis    Paroxysmal atrial fibrillation (HCC) CHA2DS2-VASc equals 4, not anticoagulated so far because of history of intracranial bleed and aneurysm 02/26/2020   Atrial  fibrillation (HCC) 10/28/2019   Palpitations 04/03/2019   Dyspnea on exertion 04/03/2019   Precordial chest pain 04/03/2019   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  MEDICAL HISTORY: Past Medical History:  Diagnosis Date   Ascending aorta enlargement    ectactic 3.5 cm 05/03/19 CTA chest   Brain aneurysm 04/03/2019   Breast cancer (HCC) 08/20/2021   Cancer (HCC) 1994   colon-resection   Cerebral aneurysm 04/03/2019   Cerebral aneurysm, nonruptured 2010   not treated due to location-monitor yearly   Dyspnea on exertion 04/03/2019   Dysrhythmia    afib    GERD (gastroesophageal reflux disease)    infrequent   Guaiac + stool 02/26/2020   History of hiatal hernia    Hypertension    well controlled   Incontinence of urine    Joint pain, knee    Palpitations 04/03/2019   Paroxysmal atrial fibrillation (HCC) CHA2DS2-VASc equals 4, not anticoagulated so far because of history of intracranial bleed and aneurysm 10/28/2019   s/p Watchman Device 03/25/20   Personal history of radiation therapy    Precordial chest pain 04/03/2019   Renal mass    s/p right inferior 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   Stroke [facility])    due to aneurysm rupture in 2010    Urgency of urination     SURGICAL HISTORY: Past Surgical History:  Procedure Laterality Date   ABDOMINAL HYSTERECTOMY     ANEURYSM COILING  2010   cerebral coiling-no deficits   BACK SURGERY     BREAST BIOPSY Right 08/20/2021   BREAST EXCISIONAL BIOPSY Left 04/05/1994   BREAST LUMPECTOMY     BREAST LUMPECTOMY WITH RADIOACTIVE SEED LOCALIZATION Right 09/28/2021   Procedure: RIGHT BREAST LUMPECTOMY WITH RADIOACTIVE SEED LOCALIZATION;  Surgeon: Vanderbilt Ned, MD;  Location: [facility] OR;  Service: General;  Laterality: Right;   CHOLECYSTECTOMY     CYSTOSCOPY   09/03/2012   Procedure: CYSTOSCOPY;  Surgeon: Arlena LILLETTE Gal, MD;  Location: [facility];  Service: Urology;  Laterality: N/A;   FLEXIBLE SIGMOIDOSCOPY N/A 11/24/2014   Procedure: FLEXIBLE SIGMOIDOSCOPY;  Surgeon: Gladis MARLA Louder, MD;  Location: [facility];  Service: Endoscopy;  Laterality: N/A;   IR EMBO ART  VEN HEMORR LYMPH EXTRAV  INC GUIDE ROADMAPPING  11/18/2020   IR RADIOLOGIST EVAL & MGMT  09/23/2020   IR RADIOLOGIST EVAL & MGMT  12/15/2020   IR RADIOLOGIST EVAL & MGMT  02/23/2021   IR RADIOLOGIST EVAL & MGMT  05/23/2022   IR RADIOLOGIST EVAL & MGMT  11/24/2022   IR RENAL SUPRASEL UNI S&I MOD SED  11/18/2020   IR US  GUIDE VASC ACCESS RIGHT  11/18/2020   LAMINECTOMY     LEFT ATRIAL APPENDAGE OCCLUSION     device implanted 03/25/20 at [facility]   LYSIS OF ADHESION  2000's   with cholecystectomy procedure   PILONIDAL CYST EXCISION     PUBOVAGINAL SLING  09/03/2012   Procedure: CARLOYN GLADE;  Surgeon: Arlena LILLETTE Gal, MD;  Location: [facility];  Service: Urology;  Laterality: N/A;  LYNX SLING    RADIOLOGY WITH ANESTHESIA Right 11/18/2020   Procedure: IR WITH ANESTHESIA  RENAL CRYOABLATION;  Surgeon: Johann Sieving, MD;  Location: [facility];  Service: Radiology;  Laterality: Right;    SOCIAL HISTORY: Social History   Socioeconomic History   Marital status: Widowed    Spouse name: Not on file   Number of children: Not on file   Years of education: Not on file   Highest education level: Not on file  Occupational History   Not on file  Tobacco Use   Smoking status: Never    Passive exposure: Never   Smokeless tobacco: Never   Tobacco comments:    Never smoked 05/13/24  Vaping Use   Vaping status: Never Used  Substance and Sexual Activity   Alcohol  use: No   Drug use: No   Sexual activity: Not on file  Other Topics Concern   Not on file  Social History Narrative   Are you right handed or left handed? 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 Retired   Do you live at home alone? Yes   Who lives with you?    What type of home do you live in: 1 story or 2 story? Lives in a one story home and has two steps to get into house.        Social Drivers of Health   Tobacco Use: Low Risk (07/22/2024)   Patient History    Smoking Tobacco Use: Never    Smokeless Tobacco Use: Never    Passive Exposure: Never  Financial Resource Strain: Low Risk (07/22/2024)   Overall Financial Resource Strain (CARDIA)    Difficulty of Paying Living Expenses: Not hard at all  Food Insecurity: No Food Insecurity (07/22/2024)   Epic    Worried About Programme Researcher, Broadcasting/film/video in the Last Year: Never true    Ran Out of Food in the Last Year: Never true  Transportation Needs: No Transportation Needs (07/22/2024)   Epic    Lack of Transportation (Medical): No    Lack of Transportation (Non-Medical): No  Physical Activity: Insufficiently Active (07/22/2024)   Exercise Vital Sign    Days of Exercise per Week: 7 days    Minutes of Exercise per Session: 20 min  Stress: No Stress Concern Present (07/22/2024)   [employer] of Occupational Health - Occupational Stress Questionnaire    Feeling of Stress: Not at all  Social Connections: Moderately Isolated (07/22/2024)   Social Connection and Isolation Panel    Frequency of Communication with Friends and Family: Once a week    Frequency of Social Gatherings with Friends and Family: Once a week    Attends Religious Services: More than 4 times per year    Active Member of [employer] or Organizations: Yes    Attends Banker Meetings: More than 4 times per year    Marital Status: Widowed  Intimate Partner Violence: Not At Risk (07/22/2024)   Epic    Fear of Current or Ex-Partner: No    Emotionally Abused: No    Physically Abused: No    Sexually Abused: No  Depression (PHQ2-9): Low Risk (07/22/2024)   Depression  (PHQ2-9)    PHQ-2 Score: 0  Alcohol  Screen: Low Risk (07/22/2024)   Alcohol  Screen    Last Alcohol  Screening Score (AUDIT): 0  Housing: Unknown (07/22/2024)   Epic    Unable to Pay for Housing in the Last Year: No    Number of Times Moved in the Last Year:  Not on file    Homeless in the Last Year: No  Utilities: Not At Risk (07/22/2024)   Epic    Threatened with loss of utilities: No  Health Literacy: Adequate Health Literacy (07/22/2024)   B1300 Health Literacy    Frequency of need for help with medical instructions: Never    FAMILY HISTORY: Family History  Problem Relation Age of Onset   Seizures Mother    Heart attack Father        Massive Heart Attack   Heart Problems Father    Parkinson's disease Maternal Grandfather    Heart Problems Paternal Grandfather    Breast cancer Cousin    Ovarian cancer Cousin    Diabetes Other     Review of Systems  Constitutional:  Negative for appetite change, chills, fatigue, fever and unexpected weight change.  HENT:   Negative for hearing loss, lump/mass and trouble swallowing.   Eyes:  Negative for eye problems and icterus.  Respiratory:  Negative for chest tightness, cough and shortness of breath.   Cardiovascular:  Negative for chest pain, leg swelling and palpitations.  Gastrointestinal:  Negative for abdominal distention, abdominal pain, constipation, diarrhea, nausea and vomiting.  Endocrine: Negative for hot flashes.  Genitourinary:  Negative for difficulty urinating.   Musculoskeletal:  Negative for arthralgias.  Skin:  Negative for itching and rash.  Neurological:  Negative for dizziness, extremity weakness, headaches and numbness.  Hematological:  Negative for adenopathy. Does not bruise/bleed easily.  Psychiatric/Behavioral:  Negative for depression. The patient is not nervous/anxious.       PHYSICAL EXAMINATION   Onc Performance Status - 07/22/24 1032       ECOG Perf Status   ECOG Perf Status Restricted in  physically strenuous activity but ambulatory and able to carry out work of a light or sedentary nature, e.g., light house work, office work      KPS SCALE   KPS % SCORE Able to carry on normal activity, minor s/s of disease          Vitals:   07/22/24 1019  BP: (!) 147/60  Pulse: (!) 54  Resp: 17  Temp: 97.8 F (36.6 C)  SpO2: 96%    Physical Exam Constitutional:      General: She is not in acute distress.    Appearance: Normal appearance. She is not toxic-appearing.  HENT:     Head: Normocephalic and atraumatic.     Mouth/Throat:     Mouth: Mucous membranes are moist.     Pharynx: Oropharynx is clear. No oropharyngeal exudate or posterior oropharyngeal erythema.  Eyes:     General: No scleral icterus. Cardiovascular:     Rate and Rhythm: Normal rate and regular rhythm.     Pulses: Normal pulses.     Heart sounds: Normal heart sounds.  Pulmonary:     Effort: Pulmonary effort is normal.     Breath sounds: Normal breath sounds.  Chest:     Comments: Right breast s/p lumpectomy and radiation, no sign of local recurrence; left breast benign Abdominal:     General: Abdomen is flat. Bowel sounds are normal. There is no distension.     Palpations: Abdomen is soft.     Tenderness: There is no abdominal tenderness.  Musculoskeletal:        General: No swelling.     Cervical back: Neck supple.  Lymphadenopathy:     Cervical: No cervical adenopathy.     Upper Body:     Right upper  body: No supraclavicular or axillary adenopathy.     Left upper body: No supraclavicular or axillary adenopathy.  Skin:    General: Skin is warm and dry.     Findings: No rash.  Neurological:     General: No focal deficit present.     Mental Status: She is alert.  Psychiatric:        Mood and Affect: Mood normal.        Behavior: Behavior normal.     LABORATORY DATA:  CBC    Component Value Date/Time   WBC 7.9 09/22/2021 0949   RBC 4.89 09/22/2021 0949   HGB 12.8 09/22/2021 0949    HGB 14.9 06/29/2020 0957   HCT 41.5 09/22/2021 0949   HCT 44.5 06/29/2020 0957   PLT 240 09/22/2021 0949   PLT 243 06/29/2020 0957   MCV 84.9 09/22/2021 0949   MCV 86 06/29/2020 0957   MCH 26.2 09/22/2021 0949   MCHC 30.8 09/22/2021 0949   RDW 14.6 09/22/2021 0949   RDW 13.1 06/29/2020 0957   LYMPHSABS 1.2 11/19/2020 0827   MONOABS 0.6 11/19/2020 0827   EOSABS 0.0 11/19/2020 0827   BASOSABS 0.0 11/19/2020 0827    CMP     Component Value Date/Time   NA 139 09/22/2021 0949   NA 140 10/28/2019 1631   K 4.0 09/22/2021 0949   CL 105 09/22/2021 0949   CO2 28 09/22/2021 0949   GLUCOSE 87 09/22/2021 0949   BUN 20 09/22/2021 0949   BUN 18 10/28/2019 1631   CREATININE 1.03 (H) 09/22/2021 0949   CALCIUM 10.0 09/22/2021 0949   PROT 6.8 01/03/2009 2335   ALBUMIN  3.9 01/03/2009 2335   AST 24 01/03/2009 2335   ALT 16 01/03/2009 2335   ALKPHOS 50 01/03/2009 2335   BILITOT 1.4 (H) 01/03/2009 2335   GFRNONAA 55 (L) 09/22/2021 0949   GFRAA 62 10/28/2019 1631     ASSESSMENT and THERAPY PLAN:    Assessment and Plan Assessment & Plan Ductal carcinoma in situ of the right breast, status post lumpectomy and adjuvant radiation Clinically stable with no new symptoms or signs of recurrence. Mammogram showed no malignancy, breast density category B. Declined anti-estrogen therapy, continues annual surveillance. - Reviewed mammogram, no malignancy. - Advised annual mammography surveillance. - Instructed to report new breast masses or symptoms. - Recommended follow-up in one year or sooner if symptoms develop. - Recommended to review fatigue with PCP at her upcoming appointment.   RTC in 1 year for continued long-term f/u  All questions were answered. The patient knows to call the clinic with any problems, questions or concerns. We can certainly see the patient much sooner if necessary.  Total encounter time:20 minutes*in face-to-face visit time, chart review, lab review, care  coordination, order entry, and documentation of the encounter time.    Morna Kendall, NP 07/22/2024 10:52 AM Medical Oncology and Hematology [facility] [address] [place], [place] [address] Tel. [phone]    Fax. [phone]  *Total Encounter Time as defined by the Centers for Medicare and Medicaid Services includes, in addition to the face-to-face time of a patient visit (documented in the note above) non-face-to-face time: obtaining and reviewing outside history, ordering and reviewing medications, tests or procedures, care coordination (communications with other health care professionals or caregivers) and documentation in the medical record.

## 2024-08-26 ENCOUNTER — Ambulatory Visit: Admitting: Neurology

## 2024-08-26 ENCOUNTER — Encounter: Payer: Self-pay | Admitting: Neurology

## 2024-08-26 VITALS — BP 137/73 | HR 75 | Ht 65.0 in | Wt 182.0 lb

## 2024-08-26 DIAGNOSIS — R202 Paresthesia of skin: Secondary | ICD-10-CM

## 2024-08-26 DIAGNOSIS — R200 Anesthesia of skin: Secondary | ICD-10-CM | POA: Diagnosis not present

## 2024-08-26 DIAGNOSIS — M546 Pain in thoracic spine: Secondary | ICD-10-CM

## 2024-08-26 DIAGNOSIS — G8929 Other chronic pain: Secondary | ICD-10-CM

## 2024-08-26 MED ORDER — TIZANIDINE HCL 2 MG PO TABS: 1.0000 mg | ORAL_TABLET | Freq: Every evening | ORAL | 3 refills | Status: AC | PRN

## 2024-08-26 NOTE — Progress Notes (Signed)
 "   Follow-up Visit   Date: 08/26/2024    Kimberly Gay MRN: [id number] DOB: [birth year]    Kimberly Gay is a 84 y.o. right-handed Caucasian female with PAF, hypertension, history of breast cancer, history of colon cancer, CKD, history of ruptured cerebral aneurysm s/p coiling returning to the clinic for follow-up of bilateral feet numbness and new complaints of thoracic pain.  The patient was accompanied to the clinic by self.     IMPRESSION/PLAN: Assessment & Plan Thoracic spondylosis with pain Chronic right thoracic pain due to degenerative changes in the thoracic spine. MRI shows no nerve compression. Tizanidine  provides relief but causes balance issues. - Advised taking tizanidine  2mg  (half tablet) to reduce side effects while maintaining pain relief.  2.  Bilateral feet paresthesias Intermittent burning and tingling in both feet, more pronounced in the right foot. Symptoms improve with warming. No diabetes, but family history present. No evidence of large fiber neuropathy.  She may have small fiber neuropathy or dynamic nerve impingement at L5-S1.  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   - Monitor symptoms and consider skin biopsy if symptoms worsen.   Return to clinic in 4 months  --------------------------------------------- History of present illness: Starting in 2022, she began having cold sensation in both feet. She also has numbness and tingling in the feet, worse in the left. Symptoms are present at all times but can intensify without any specific triggers. Prolonged walking can aggravate her feet. She also complains of mid-back pain and is doing PT. No history of diabetes. She had chemotherapy for colon cancer 30+ years ago, but does not recall having numbness/tingling at that time. She did not need chemotherapy for breast cancer. NCS/EMG of the legs in 2022 was normal.   UPDATE 02/27/2024:  She is here for follow-up visit.  She continues to have spells of  numbness/tingling in the feet.  NCS/EMG of the legs was normal.  MRI lumbar spine shows degenerative changes no specific nerve impingement.    For the past several years, she also complains of burning pain over the mid-thoracic region which radiates to the right.  Pain is intermittent throughout the day, but very bothersome.   UPDATE 08/27/2023:  Discussed the use of AI scribe software for clinical note transcription with the patient, who gave verbal consent to proceed.  History of Present Illness Kimberly Gay is an 84 year old female who presents for a six-month follow-up for back pain and neuropathy symptoms.  She experiences persistent back pain, similar to the pain she had when she had gallstones. The pain is located in her back, radiating down and around to the area under her bottom ribs. It is described as sharp and worsens when she leans back on something. Physical therapy, including massage, provided temporary relief, but other treatments have not been effective. She uses tizanidine , which helps with the pain and sleep, but causes balance issues in the morning, leading her to use it sparingly.  She experiences intermittent burning and tingling in her feet, more pronounced in the right foot and temperature dependent. The symptoms are less severe than before but persist. She has a history of using gabapentin, which she found effective but is cautious about long-term use due to potential side effects.   An MRI of the thoracic spine was performed, which showed age-related changes. She is scheduled for a colonoscopy and endoscopy next week to investigate gastrointestinal issues, as she has a history of gallbladder removal and partial colon  resection. She reports bloating and discomfort in the abdominal area, which she associates with her back pain.   Medications:  Current Outpatient Medications on File Prior to Visit  Medication Sig Dispense Refill   acetaminophen  (TYLENOL ) 500 MG tablet Take  500-1,000 mg by mouth every 6 (six) hours as needed for mild pain.     aspirin  EC 81 MG tablet Take 1 tablet (81 mg total) by mouth daily. Swallow whole. 90 tablet 3   cetirizine (ZYRTEC) 10 MG tablet Take 10 mg by mouth daily as needed for allergies.     cholecalciferol (VITAMIN D) 1000 UNITS tablet Take 2,000 Units by mouth daily.      clotrimazole-betamethasone (LOTRISONE) cream Apply 1 Application topically 2 (two) times daily.     EPINEPHrine  (EPIPEN ) 0.3 mg/0.3 mL SOAJ injection Inject 0.3 mLs (0.3 mg total) into the muscle once. 1 Device 0   flecainide  (TAMBOCOR ) 50 MG tablet Take 1 tablet (50 mg total) by mouth 2 (two) times daily. 180 tablet 3   furosemide (LASIX) 20 MG tablet Take 20 mg by mouth daily.      hydrocortisone 2.5 % cream Apply 2.5 application topically daily as needed (hemorrhoids).     metoprolol  succinate (TOPROL -XL) 50 MG 24 hr tablet Take 1 tablet (50 mg total) by mouth daily. 90 tablet 3   nitroGLYCERIN  (NITROSTAT ) 0.4 MG SL tablet Place 1 tablet (0.4 mg total) under the tongue every 5 (five) minutes as needed for chest pain. 25 tablet 11   omeprazole (PRILOSEC) 40 MG capsule Take 40 mg by mouth every morning.     phenylephrine -shark liver oil-mineral oil-petrolatum (PREPARATION H) 0.25-3-14-71.9 % rectal ointment Place 1 application rectally 2 (two) times daily as needed for hemorrhoids.     Polyethyl Glycol-Propyl Glycol (SYSTANE OP) Place 1 drop into both eyes 2 (two) times daily.     tiZANidine  (ZANAFLEX ) 2 MG tablet Take 1 tablet (2 mg total) by mouth at bedtime as needed for muscle spasms. 30 tablet 3   valACYclovir (VALTREX) 1000 MG tablet Take 1,000 mg by mouth once as needed.     No current facility-administered medications on file prior to visit.    Allergies:  Allergies  Allergen Reactions   Amoxicillin Swelling   Meperidine  And Related Other (See Comments)    Unknown reaction    Wasp Venom Swelling    Vital Signs:  BP 137/73   Pulse 75   Ht 5'  5 (1.651 m)   Wt 182 lb (82.6 kg)   SpO2 96%   BMI 30.29 kg/m   Neurological Exam: MENTAL STATUS including orientation to time, place, person, recent and remote memory, attention span and concentration, language, and fund of knowledge is normal.  Speech is not dysarthric.  CRANIAL NERVES:  Pupils equal round and reactive to light.  Normal conjugate, extra-ocular eye movements in all directions of gaze.  No ptosis.  Face is symmetric.   MOTOR:  Motor strength is 5/5 in all extremities.  No atrophy, fasciculations or abnormal movements.  No pronator drift.  Tone is normal.    MSRs:  Reflexes are 2+/4 throughout, except 1+/4 at the ankles.  SENSORY:  Reduced vibration at the right ankle and foot, otherwise intact throughout.  Temperature intact.  COORDINATION/GAIT:  Normal finger-to- nose-finger.  Intact rapid alternating movements bilaterally.  Gait mildly wide-based and stable. Unassisted.   Data: NCS/EMG of the legs 01/26/2024:  Normal  MRI lumbar spine 02/22/2024: 1. 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 2. 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  MRI thoracic spine wo contrast 03/19/2024: 1. No acute fracture or malalignment. 2. Spondylotic changes without high-grade canal or foraminal stenosis.    Thank you for allowing me to participate in patient's care.  If I can answer any additional questions, I would be pleased to do so.    Sincerely,    Natane Heward K. Tobie, DO   "

## 2024-08-26 NOTE — Patient Instructions (Signed)
 Take half-tablet of tizanidine  at bedtime as needed for back pain

## 2024-11-11 ENCOUNTER — Ambulatory Visit: Admitting: Neurology

## 2025-07-23 ENCOUNTER — Inpatient Hospital Stay: Admitting: Adult Health

## 2025-07-23 ENCOUNTER — Inpatient Hospital Stay

## 2025-08-26 ENCOUNTER — Ambulatory Visit: Payer: Self-pay | Admitting: Neurology
